# Patient Record
Sex: Male | Born: 1945 | Race: White | Hispanic: No | Smoking: Never smoker
Health system: Southern US, Community
[De-identification: ages and names within clinical notes are randomized; demographics above are authoritative.]

## PROBLEM LIST (undated history)

## (undated) DIAGNOSIS — N201 Calculus of ureter: Secondary | ICD-10-CM

## (undated) DIAGNOSIS — R3915 Urgency of urination: Secondary | ICD-10-CM

## (undated) DIAGNOSIS — M5136 Other intervertebral disc degeneration, lumbar region: Secondary | ICD-10-CM

## (undated) DIAGNOSIS — Z87442 Personal history of urinary calculi: Secondary | ICD-10-CM

## (undated) DIAGNOSIS — M549 Dorsalgia, unspecified: Secondary | ICD-10-CM

## (undated) DIAGNOSIS — M51369 Other intervertebral disc degeneration, lumbar region without mention of lumbar back pain or lower extremity pain: Secondary | ICD-10-CM

## (undated) DIAGNOSIS — F410 Panic disorder [episodic paroxysmal anxiety] without agoraphobia: Secondary | ICD-10-CM

## (undated) DIAGNOSIS — K635 Polyp of colon: Secondary | ICD-10-CM

## (undated) DIAGNOSIS — Z8719 Personal history of other diseases of the digestive system: Secondary | ICD-10-CM

## (undated) DIAGNOSIS — R112 Nausea with vomiting, unspecified: Secondary | ICD-10-CM

## (undated) DIAGNOSIS — Z9889 Other specified postprocedural states: Secondary | ICD-10-CM

## (undated) DIAGNOSIS — N200 Calculus of kidney: Secondary | ICD-10-CM

## (undated) DIAGNOSIS — M1990 Unspecified osteoarthritis, unspecified site: Secondary | ICD-10-CM

## (undated) DIAGNOSIS — F419 Anxiety disorder, unspecified: Secondary | ICD-10-CM

## (undated) DIAGNOSIS — E7800 Pure hypercholesterolemia, unspecified: Secondary | ICD-10-CM

## (undated) DIAGNOSIS — J45909 Unspecified asthma, uncomplicated: Secondary | ICD-10-CM

## (undated) DIAGNOSIS — G8929 Other chronic pain: Secondary | ICD-10-CM

## (undated) DIAGNOSIS — F1011 Alcohol abuse, in remission: Secondary | ICD-10-CM

## (undated) DIAGNOSIS — Z87440 Personal history of urinary (tract) infections: Secondary | ICD-10-CM

## (undated) HISTORY — DX: Pure hypercholesterolemia, unspecified: E78.00

## (undated) HISTORY — DX: Other intervertebral disc degeneration, lumbar region without mention of lumbar back pain or lower extremity pain: M51.369

## (undated) HISTORY — PX: ELBOW SURGERY: SHX618

## (undated) HISTORY — DX: Panic disorder (episodic paroxysmal anxiety): F41.0

## (undated) HISTORY — PX: TONSILLECTOMY AND ADENOIDECTOMY: SUR1326

## (undated) HISTORY — PX: NASAL POLYP SURGERY: SHX186

## (undated) HISTORY — PX: JOINT REPLACEMENT: SHX530

## (undated) HISTORY — PX: KNEE ARTHROSCOPY W/ MENISCECTOMY: SHX1879

## (undated) HISTORY — PX: APPENDECTOMY: SHX54

## (undated) HISTORY — DX: Other intervertebral disc degeneration, lumbar region: M51.36

---

## 1987-12-23 HISTORY — PX: OTHER SURGICAL HISTORY: SHX169

## 1991-12-23 HISTORY — PX: CERVICAL FUSION: SHX112

## 1996-12-22 HISTORY — PX: LAPAROSCOPIC CHOLECYSTECTOMY: SUR755

## 1999-10-23 DIAGNOSIS — Z86010 Personal history of colonic polyps: Secondary | ICD-10-CM | POA: Insufficient documentation

## 1999-11-13 ENCOUNTER — Other Ambulatory Visit: Admission: RE | Admit: 1999-11-13 | Discharge: 1999-11-13 | Payer: Self-pay | Admitting: *Deleted

## 1999-12-20 ENCOUNTER — Encounter: Admission: RE | Admit: 1999-12-20 | Discharge: 1999-12-20 | Payer: Self-pay | Admitting: Gastroenterology

## 1999-12-20 ENCOUNTER — Encounter (INDEPENDENT_AMBULATORY_CARE_PROVIDER_SITE_OTHER): Payer: Self-pay | Admitting: Specialist

## 1999-12-20 ENCOUNTER — Encounter: Payer: Self-pay | Admitting: Gastroenterology

## 1999-12-20 ENCOUNTER — Ambulatory Visit (HOSPITAL_COMMUNITY): Admission: RE | Admit: 1999-12-20 | Discharge: 1999-12-20 | Payer: Self-pay | Admitting: Gastroenterology

## 2000-04-02 ENCOUNTER — Encounter: Payer: Self-pay | Admitting: Specialist

## 2000-04-02 ENCOUNTER — Encounter: Admission: RE | Admit: 2000-04-02 | Discharge: 2000-04-02 | Payer: Self-pay | Admitting: Specialist

## 2000-12-22 DIAGNOSIS — F410 Panic disorder [episodic paroxysmal anxiety] without agoraphobia: Secondary | ICD-10-CM

## 2000-12-22 HISTORY — DX: Panic disorder (episodic paroxysmal anxiety): F41.0

## 2001-01-21 DIAGNOSIS — F410 Panic disorder [episodic paroxysmal anxiety] without agoraphobia: Secondary | ICD-10-CM

## 2001-03-20 ENCOUNTER — Emergency Department (HOSPITAL_COMMUNITY): Admission: EM | Admit: 2001-03-20 | Discharge: 2001-03-20 | Payer: Self-pay | Admitting: Emergency Medicine

## 2001-06-01 ENCOUNTER — Ambulatory Visit (HOSPITAL_COMMUNITY): Admission: RE | Admit: 2001-06-01 | Discharge: 2001-06-01 | Payer: Self-pay | Admitting: Gastroenterology

## 2001-06-09 ENCOUNTER — Encounter: Payer: Self-pay | Admitting: Emergency Medicine

## 2001-06-09 ENCOUNTER — Emergency Department (HOSPITAL_COMMUNITY): Admission: EM | Admit: 2001-06-09 | Discharge: 2001-06-09 | Payer: Self-pay | Admitting: Emergency Medicine

## 2005-01-09 ENCOUNTER — Ambulatory Visit (HOSPITAL_COMMUNITY): Admission: RE | Admit: 2005-01-09 | Discharge: 2005-01-09 | Payer: Self-pay | Admitting: Gastroenterology

## 2005-05-29 ENCOUNTER — Encounter: Admission: RE | Admit: 2005-05-29 | Discharge: 2005-05-29 | Payer: Self-pay | Admitting: Otolaryngology

## 2005-07-05 ENCOUNTER — Emergency Department (HOSPITAL_COMMUNITY): Admission: EM | Admit: 2005-07-05 | Discharge: 2005-07-05 | Payer: Self-pay | Admitting: Family Medicine

## 2005-08-14 IMAGING — CR DG KNEE COMPLETE 4+V*L*
4 series · 4 of 4 positions shown · non-contrast
Comparison: none

CLINICAL DATA: Injured knee 2 week ago with pain.   
 LEFT KNEE ? FOUR VIEWS:
 Four views of the left knee were obtained.   No acute bony abnormality is seen.   There is slight loss of joint space medially.  Degenerative spurring is noted from the patella superiorly.

[view not recorded (1 of 4)]
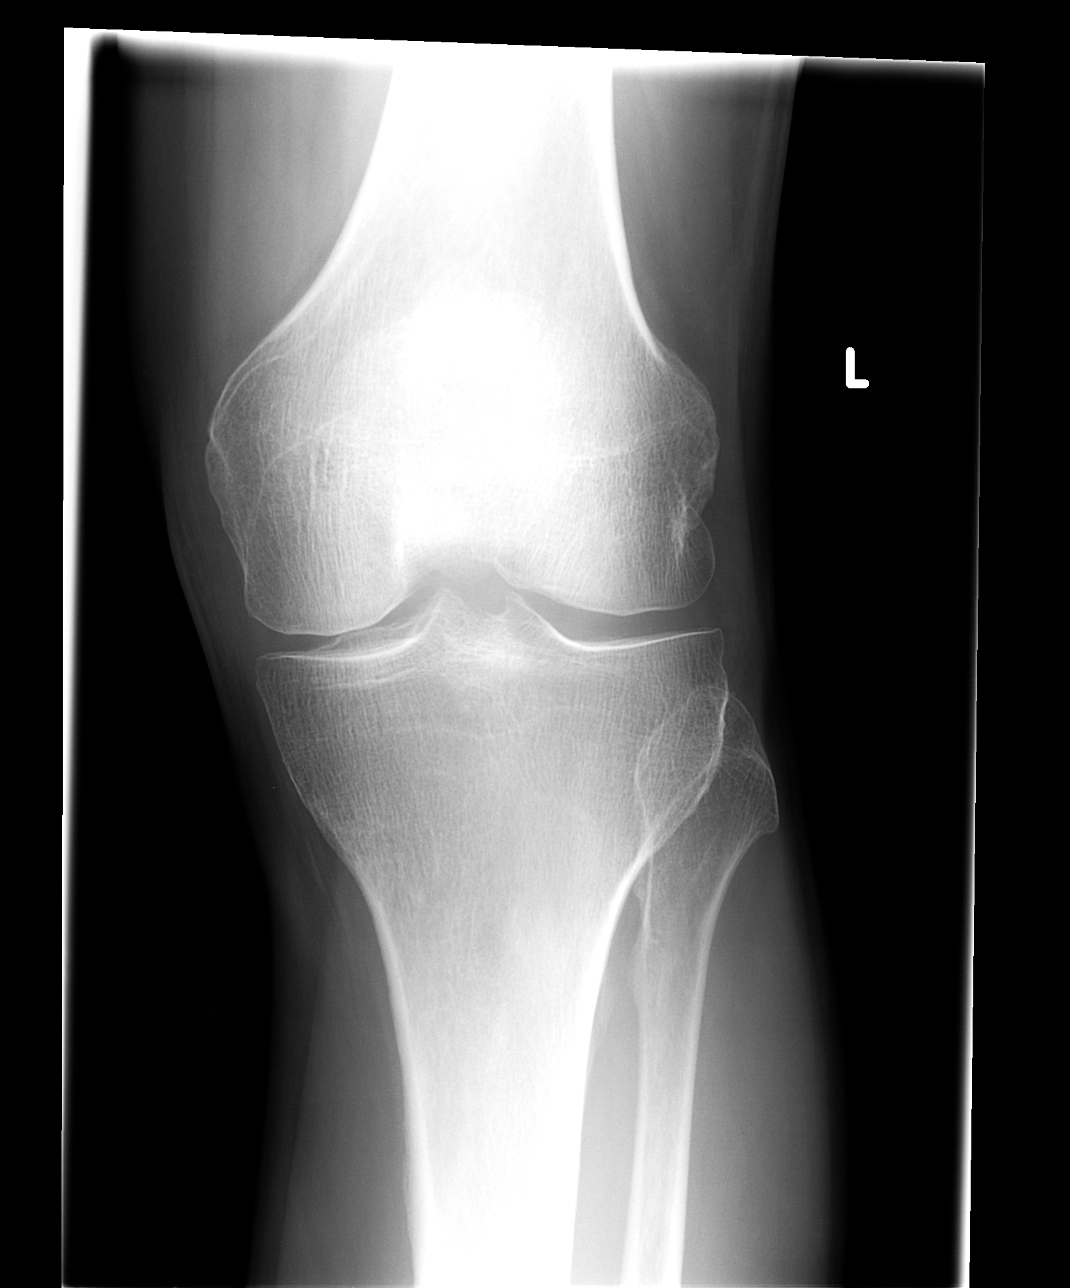

[view not recorded (2 of 4)]
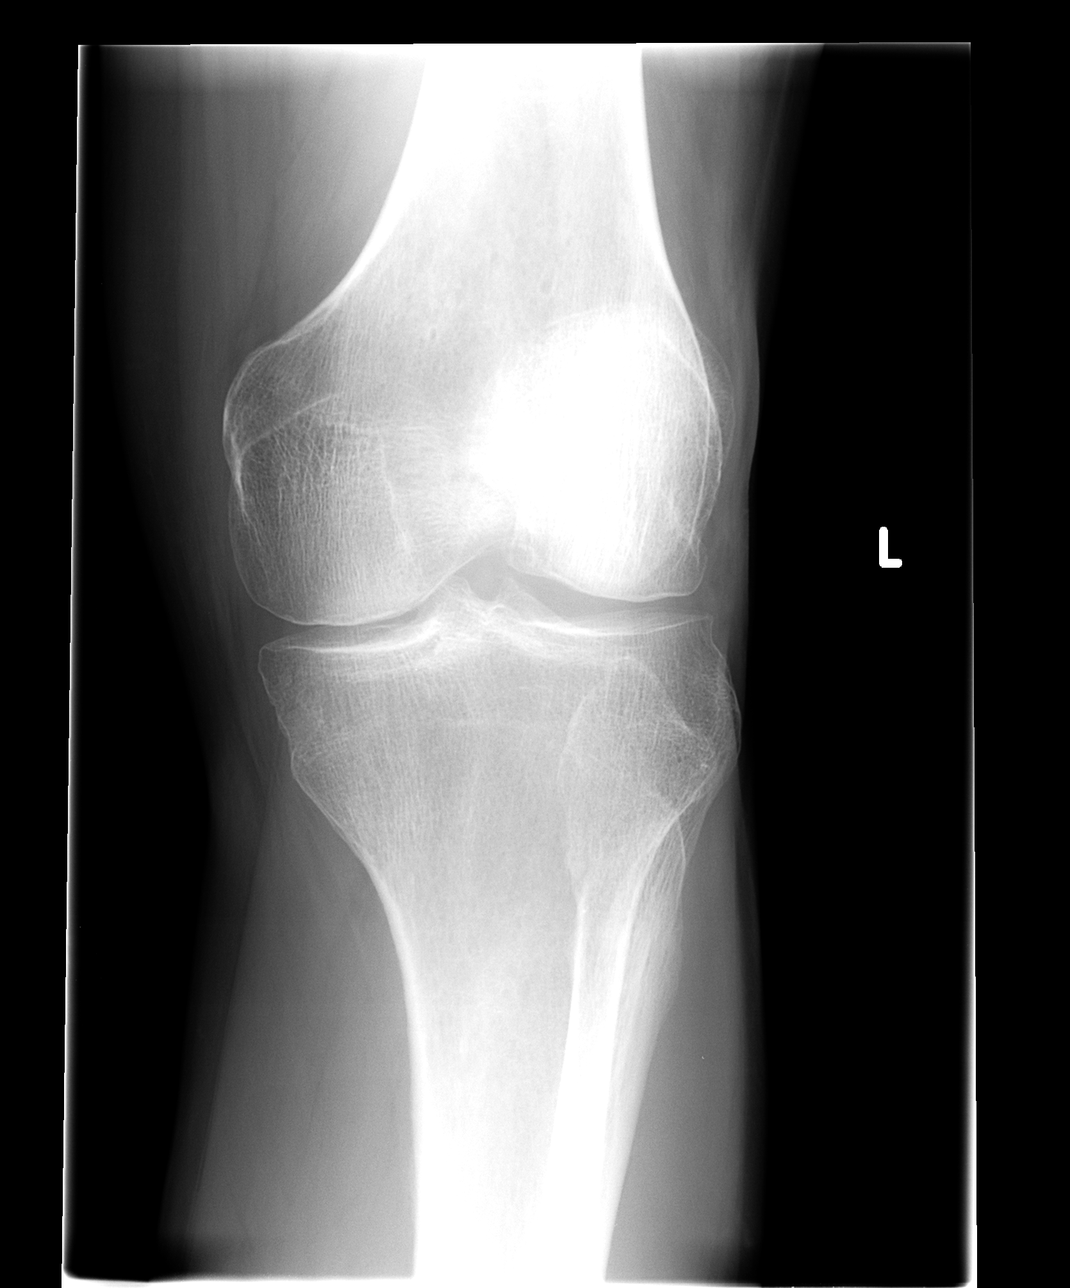

[view not recorded (3 of 4)]
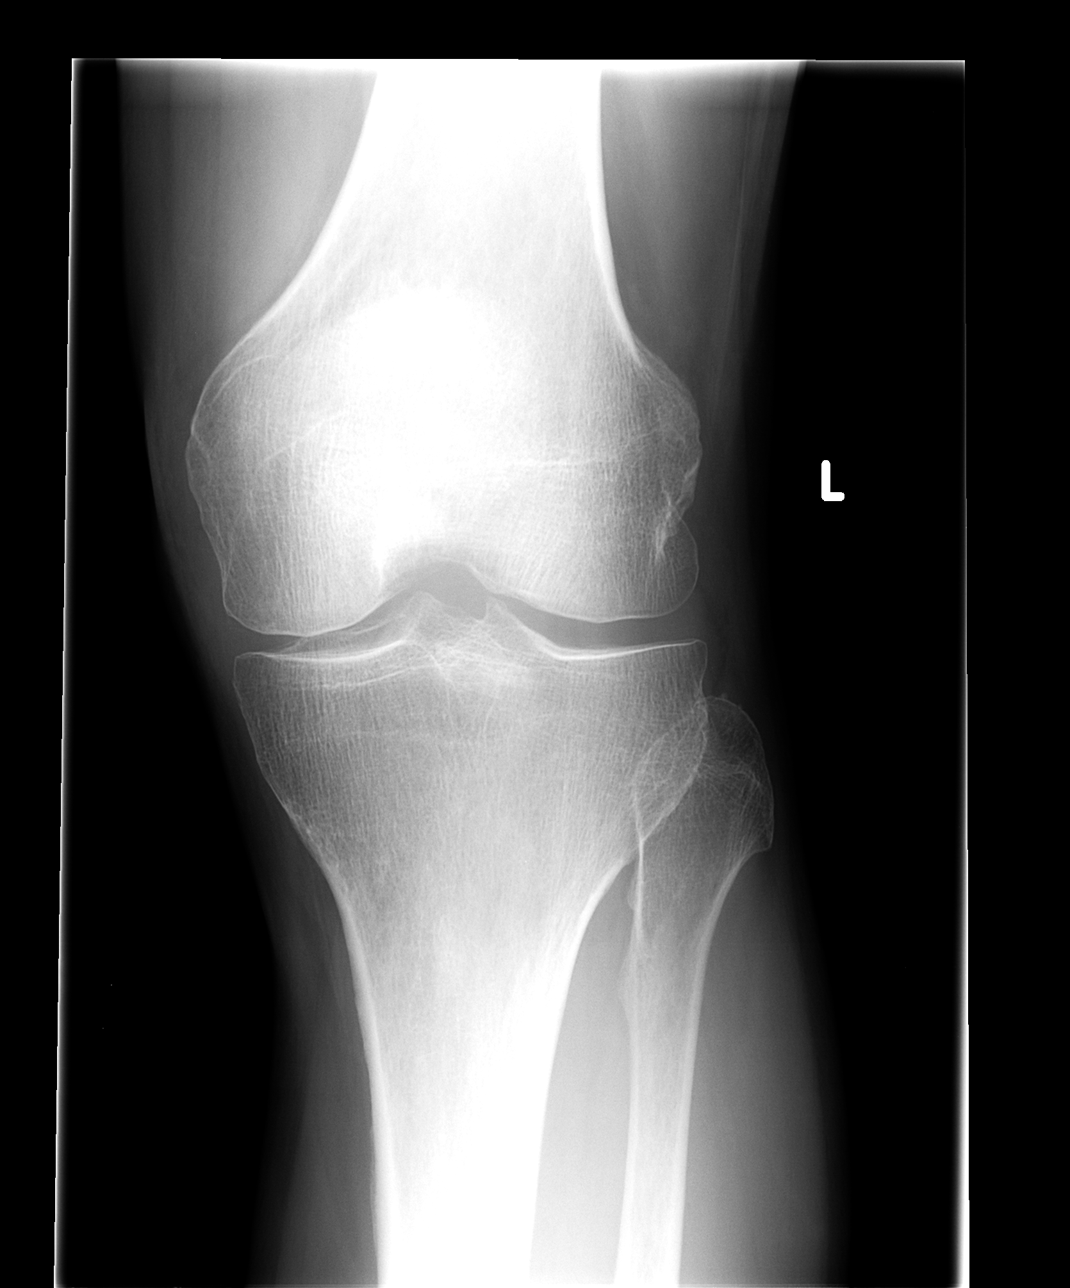

[view not recorded (4 of 4)]
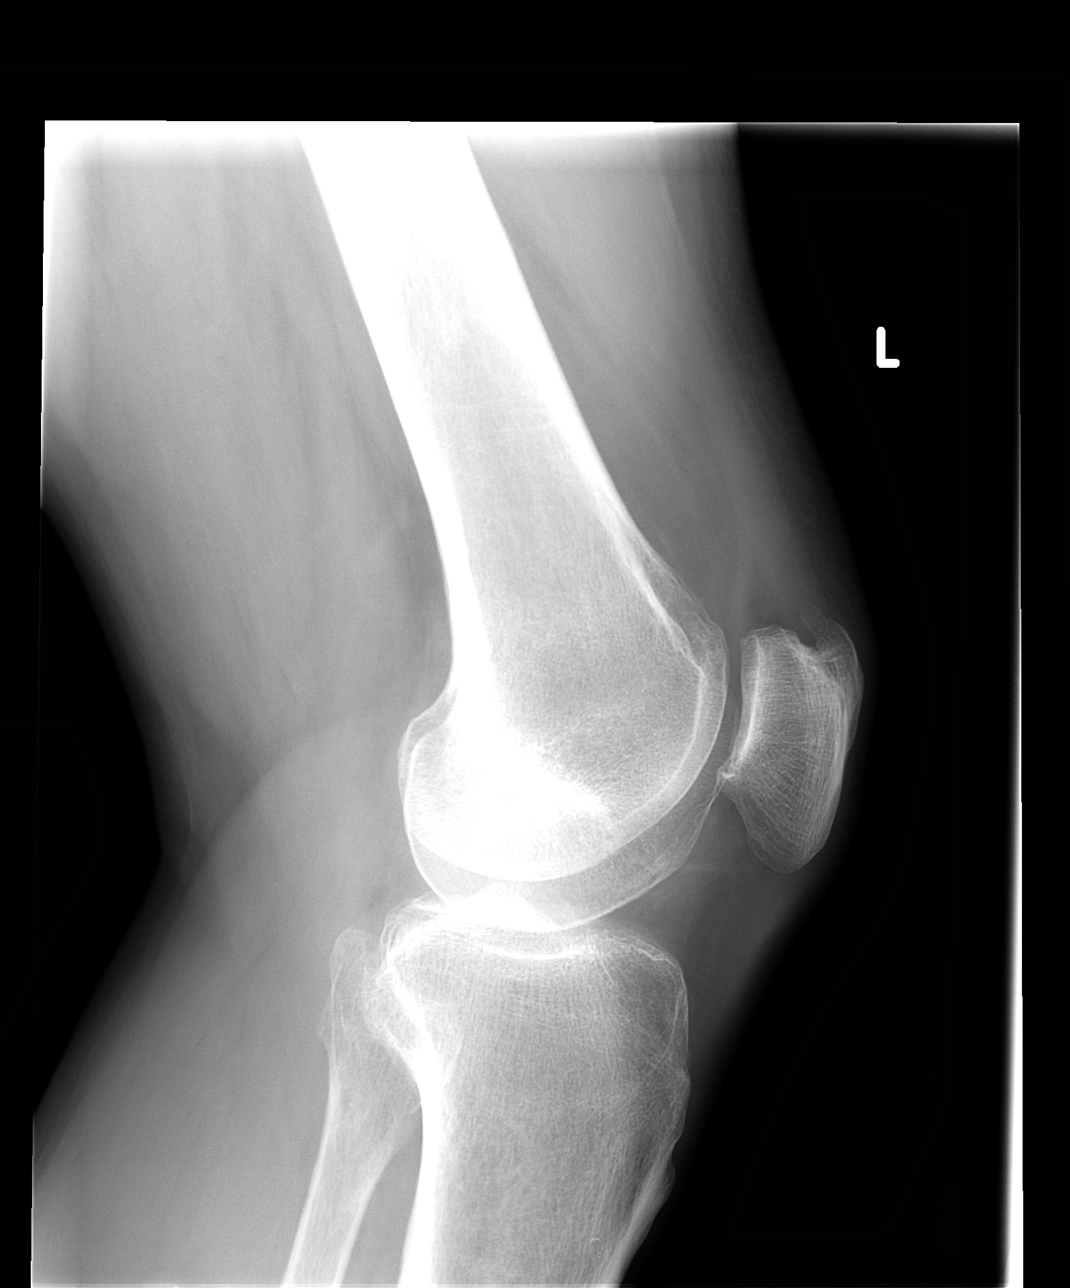

[4 of 4 positions shown; findings below may reference images not displayed]

IMPRESSION: Degenerative change.  No acute abnormality.   No effusion.

## 2006-07-30 ENCOUNTER — Encounter: Admission: RE | Admit: 2006-07-30 | Discharge: 2006-07-30 | Payer: Self-pay | Admitting: Internal Medicine

## 2008-01-27 ENCOUNTER — Ambulatory Visit (HOSPITAL_COMMUNITY): Admission: RE | Admit: 2008-01-27 | Discharge: 2008-01-27 | Payer: Self-pay | Admitting: Urology

## 2008-10-03 ENCOUNTER — Encounter: Admission: RE | Admit: 2008-10-03 | Discharge: 2008-10-03 | Payer: Self-pay | Admitting: Specialist

## 2008-10-05 ENCOUNTER — Ambulatory Visit (HOSPITAL_BASED_OUTPATIENT_CLINIC_OR_DEPARTMENT_OTHER): Admission: RE | Admit: 2008-10-05 | Discharge: 2008-10-05 | Payer: Self-pay | Admitting: Specialist

## 2008-10-05 HISTORY — PX: SHOULDER ARTHROSCOPY WITH ROTATOR CUFF REPAIR AND SUBACROMIAL DECOMPRESSION: SHX5686

## 2009-02-15 ENCOUNTER — Ambulatory Visit (HOSPITAL_BASED_OUTPATIENT_CLINIC_OR_DEPARTMENT_OTHER): Admission: RE | Admit: 2009-02-15 | Discharge: 2009-02-15 | Payer: Self-pay | Admitting: Specialist

## 2009-02-15 ENCOUNTER — Encounter (INDEPENDENT_AMBULATORY_CARE_PROVIDER_SITE_OTHER): Payer: Self-pay | Admitting: Specialist

## 2009-02-15 HISTORY — PX: OTHER SURGICAL HISTORY: SHX169

## 2009-04-17 ENCOUNTER — Ambulatory Visit (HOSPITAL_BASED_OUTPATIENT_CLINIC_OR_DEPARTMENT_OTHER): Admission: RE | Admit: 2009-04-17 | Discharge: 2009-04-17 | Payer: Self-pay | Admitting: Urology

## 2009-04-17 HISTORY — PX: OTHER SURGICAL HISTORY: SHX169

## 2010-01-31 ENCOUNTER — Ambulatory Visit (HOSPITAL_COMMUNITY): Admission: RE | Admit: 2010-01-31 | Discharge: 2010-01-31 | Payer: Self-pay | Admitting: Gastroenterology

## 2010-01-31 LAB — HM COLONOSCOPY

## 2010-04-26 ENCOUNTER — Inpatient Hospital Stay (HOSPITAL_COMMUNITY): Admission: RE | Admit: 2010-04-26 | Discharge: 2010-04-28 | Payer: Self-pay | Admitting: Orthopedic Surgery

## 2010-04-26 HISTORY — PX: TOTAL HIP ARTHROPLASTY: SHX124

## 2010-11-05 ENCOUNTER — Encounter (INDEPENDENT_AMBULATORY_CARE_PROVIDER_SITE_OTHER): Payer: Self-pay | Admitting: Internal Medicine

## 2010-11-05 LAB — CONVERTED CEMR LAB
Cholesterol: 224 mg/dL
HDL: 57 mg/dL
LDL Cholesterol: 151 mg/dL
Triglycerides: 79 mg/dL

## 2010-11-29 ENCOUNTER — Ambulatory Visit: Payer: Self-pay | Admitting: Internal Medicine

## 2010-11-29 DIAGNOSIS — K5730 Diverticulosis of large intestine without perforation or abscess without bleeding: Secondary | ICD-10-CM | POA: Insufficient documentation

## 2010-11-29 DIAGNOSIS — J45909 Unspecified asthma, uncomplicated: Secondary | ICD-10-CM

## 2010-11-29 DIAGNOSIS — M76899 Other specified enthesopathies of unspecified lower limb, excluding foot: Secondary | ICD-10-CM

## 2010-12-11 ENCOUNTER — Encounter (INDEPENDENT_AMBULATORY_CARE_PROVIDER_SITE_OTHER): Payer: Self-pay | Admitting: Internal Medicine

## 2010-12-18 ENCOUNTER — Encounter (INDEPENDENT_AMBULATORY_CARE_PROVIDER_SITE_OTHER): Payer: Self-pay | Admitting: Nurse Practitioner

## 2010-12-30 ENCOUNTER — Telehealth (INDEPENDENT_AMBULATORY_CARE_PROVIDER_SITE_OTHER): Payer: Self-pay | Admitting: Internal Medicine

## 2011-01-08 ENCOUNTER — Ambulatory Visit
Admission: RE | Admit: 2011-01-08 | Discharge: 2011-01-08 | Payer: Self-pay | Source: Home / Self Care | Attending: Internal Medicine | Admitting: Internal Medicine

## 2011-01-08 DIAGNOSIS — J309 Allergic rhinitis, unspecified: Secondary | ICD-10-CM | POA: Insufficient documentation

## 2011-01-10 ENCOUNTER — Telehealth (INDEPENDENT_AMBULATORY_CARE_PROVIDER_SITE_OTHER): Payer: Self-pay | Admitting: Internal Medicine

## 2011-01-15 ENCOUNTER — Telehealth (INDEPENDENT_AMBULATORY_CARE_PROVIDER_SITE_OTHER): Payer: Self-pay | Admitting: Nurse Practitioner

## 2011-01-23 NOTE — Progress Notes (Signed)
Summary: NASAL SPRAY CASUING HEADACHES  Phone Note Call from Patient Call back at Home Phone [phone]   Reason for Call: Talk to Nurse Summary of Call: [name] PT. [name] CALLED AND SAYS THAT AFTER USING THE NASAL SPRAY, HE STARTED EXPERIENCING HEADACHES.  HE USES [facility] [address] Initial call taken by: Leodis Rains,  January 10, 2011 12:36 PM  Follow-up for Phone Call        Has used spray daily since Wednesday, headaches started yesterday, went away last night.  Used nasal spray again today, aching, "irritating"  headache over eyes occurred again.  Denies other symptoms.  Wants to know if there is anything else for him to try. Follow-up by: Dutch Quint RN,  January 10, 2011 12:51 PM  Additional Follow-up for Phone Call Additional follow up Details #1::        Have him try cutting the dose down to 1 spray each nostril and see if gets some benefit over time without the headache.  If still with the headache to  stop and we will see how he does just on the Fexofenadine. Additional Follow-up by: Julieanne Manson MD,  January 13, 2011 11:40 AM    Additional Follow-up for Phone Call Additional follow up Details #2::    Pt. advised of provider's instructions re nasal spray and f/u - verbalized understanding and agreement.  States he had a slight fever and some chills over the weekend, aching all over, mild cold symptoms.  Thinks the headache may be related to current symptoms.  Denies nausea/vomiting, difficulty breathing.  Appetite normal, dry cough.  Has been taking tylenol PM at night, helping with discomfort and elevated temp at night.    Is feeling better today, denies being "sick", but mild malaise and aching persists. Advised as per cold protocol - increase fluids, humidify home, Robitussin for cough.  To call back if symptoms persist or worsen.   Dutch Quint RN  January 13, 2011 11:52 AM

## 2011-01-23 NOTE — Assessment & Plan Note (Signed)
Summary: SINUS PROBLEMS / NS   Vital Signs:  Patient profile:   66 year old male Weight:      214.06 pounds O2 Sat:      95 % on Room air Temp:     96.9 degrees F oral Pulse rate:   62 / minute Pulse rhythm:   regular Resp:     16 per minute BP sitting:   116 / 74  (left arm) Cuff size:   regular  Vitals Entered By: Hale Drone CMA (January 08, 2011 9:26 AM)  O2 Flow:  Room air  Serial Vital Signs/Assessments:                                PEF    PreRx  PostRx Time      O2 Sat  O2 Type     L/min  L/min  L/min   By 9:33 AM   95  %   Room air                          Hale Drone CMA  Comments: 9:33 AM Peak Flow: 480 - 440 - 460 By: Hale Drone CMA   CC: F/u from  [facility]. He went on 12/31/10. Was given Mometasone Furoate Inhalation Powder. Is feeling better. Also having some sinus problems. Also taking Ventolin.  Is Patient Diabetic? No Pain Assessment Patient in pain? no       Does patient need assistance? Functional Status Self care Ambulation Normal   CC:  F/u from  [facility]. He went on 12/31/10. Was given Mometasone Furoate Inhalation Powder. Is feeling better. Also having some sinus problems. Also taking Ventolin. Marland Kitchen  History of Present Illness: 1.  Asthma exacerbation:  was seen at [facility] after hours clinic on the 10th for wheezing.  Had had symptoms of chest congestion, cough, wheeze for about 1 week.  Was given a sample of Asmanex, Ventolin and  a Z pack.  Feels better, but still notes wheezing at night when lying down. Still coughing, but much better.  Sinus congestion all the time--especially with weather changes.  Nasal congestion plus runny nose--generally clear.  No fevers.  Cannot recall ever using a corticosteriod nasal spray regularly--used just here and there.  Currently, using saline nasal spray regularly.  Was diagnosed with "asthma" in 1998, but has very rare exacerbations--this is really only the 3rd time since  diagnosis.  States he did have PFTs that supported diagnosis.  Sounds like he has never used regular treatment.    Current Medications (verified): 1)  Paxil 40 Mg Tabs (Paroxetine Hcl) .... Once Daily  Allergies (verified): No Known Drug Allergies Nurse Visit   Vital Signs:  Patient profile:   65 year old male Weight:      214.06 pounds O2 Sat:      95 % on Room air Temp:     96.9 degrees F oral Pulse rate:   62 / minute Pulse rhythm:   regular Resp:     16 per minute BP sitting:   116 / 74  (left arm) Cuff size:   regular  Vitals Entered By: Hale Drone CMA (January 08, 2011 9:26 AM)  O2 Flow:  Room air  Current Medications (verified): 1)  Paxil 40 Mg Tabs (Paroxetine Hcl) .... Once Daily  Allergies (verified): No Known Drug Allergies  Not Administered:  Influenza Vaccine not given due to: declined  Orders Added: 1)  Peak Flow Rate [94150] 2)  Pulse Oximetry (single measurment) [94760] 3)  Est. Patient Level III [30865] Prescriptions: FEXOFENADINE HCL 60 MG TABS (FEXOFENADINE HCL) 1 tab by mouth two times a day  #60 x 11   Entered and Authorized by:   Julieanne Manson MD   Signed by:   Julieanne Manson MD on 01/08/2011   Method used:   Electronically to        [facility] [phone]* (retail)       [address]       [place], [place]  [address]       Ph: [phone] or [phone]       Fax: [phone]   RxID:   [id number] FLUTICASONE PROPIONATE 50 MCG/ACT SUSP (FLUTICASONE PROPIONATE) 2 sprays each nostril  #1 x 11   Entered and Authorized by:   Julieanne Manson MD   Signed by:   Julieanne Manson MD on 01/08/2011   Method used:   Electronically to        [facility] [phone]* (retail)       [address]       [place], [place]  [address]       Ph: [phone] or [phone]       Fax: [phone]   RxID:   [id number]    Physical Exam  General:  NAD Head:   Tender over left maxillary sinus Eyes:  No corneal or conjunctival inflammation noted. EOMI. Perrla. Funduscopic exam benign, without hemorrhages, exudates or papilledema. Vision grossly normal. Ears:  External ear exam shows no significant lesions or deformities.  Otoscopic examination reveals clear canals, tympanic membranes are intact bilaterally without bulging, retraction, inflammation or discharge. Hearing is grossly normal bilaterally. Nose:  Nasal mucosa a bit swollen with clear discharge Mouth:  pharynx pink and moist.   Neck:  No deformities, masses, or tenderness noted. Lungs:  Normal respiratory effort, chest expands symmetrically. Lungs are clear to auscultation, no crackles or wheezes. Heart:  Normal rate and regular rhythm. S1 and S2 normal without gallop, murmur, click, rub or other extra sounds.   Impression & Recommendations:  Problem # 1:  ASTHMA (ICD-493.90) Very mild--only 3 exacerbations in over 10 years To finish Asmanex inhaler Otherwise, just use rescue inhaler and try and control what appear to be allergies Orders: Peak Flow Rate (94150) Pulse Oximetry (single measurment) (62703)  Problem # 2:  ALLERGIC RHINITIS (ICD-477.9) Start Fluticasone and Fexofenadine Continue nasal saline His updated medication list for this problem includes:    Fluticasone Propionate 50 Mcg/act Susp (Fluticasone propionate) .Marland Kitchen... 2 sprays each nostril    Fexofenadine Hcl 60 Mg Tabs (Fexofenadine hcl) .Marland Kitchen... 1 tab by mouth two times a day  Complete Medication List: 1)  Paxil 40 Mg Tabs (Paroxetine hcl) .... Once daily 2)  Fluticasone Propionate 50 Mcg/act Susp (Fluticasone propionate) .... 2 sprays each nostril 3)  Fexofenadine Hcl 60 Mg Tabs (Fexofenadine hcl) .Marland Kitchen.. 1 tab by mouth two times a day  Patient Instructions: 1)  Call if symptoms do not improve 2)  Call in May or June for CPE in November Prescriptions: FEXOFENADINE HCL 60 MG TABS (FEXOFENADINE HCL) 1 tab by mouth two  times a day  #60 x 11   Entered and Authorized by:   Julieanne Manson MD  Signed by:   Julieanne Manson MD on 01/08/2011   Method used:   Electronically to        [facility] [phone]* (retail)       [address]       [place], [place]  [address]       Ph: [phone] or [phone]       Fax: [phone]   RxID:   [id number] FLUTICASONE PROPIONATE 50 MCG/ACT SUSP (FLUTICASONE PROPIONATE) 2 sprays each nostril  #1 x 11   Entered and Authorized by:   Julieanne Manson MD   Signed by:   Julieanne Manson MD on 01/08/2011   Method used:   Electronically to        [facility] [phone]* (retail)       [address]       [place], [place]  [address]       Ph: [phone] or [phone]       Fax: [phone]   RxID:   [id number]    Orders Added: 1)  Peak Flow Rate [94150] 2)  Pulse Oximetry (single measurment) [94760] 3)  Est. Patient Level III [16073]   Not Administered:    Influenza Vaccine not given due to: declined

## 2011-01-23 NOTE — Progress Notes (Signed)
Summary: CHILLS/BODYACHING  Phone Note Call from Patient Call back at Home Phone [phone]   Summary of Call: MULBERRY PT. MR Turbin CALLED AND SAYS THAT OVER THE WEEKEND HE STARTED HAVING CHILLS, HE DOESNT FEEL SICK, CHILLS, BODY ACHES FROM TOP TO BOTTOM, A LITTLE FEVER, HEADACHE. MR Dorion IS QUESTIONING IF ALL THESE SYMPTOMS ARE COMING FROM WHERE HE HAD TO SEE HIS DENTIST LAST WEEK BECAUSE HE HAD A GUM INFECTION AND THEY DID A ROOT CANAL, ON TOP OF HIM HAVING TO TAKE AMOXICILLIN(DUE TO HAVING A HIP REPLACEMENT) BEFORE HE SAW THE DENTIS, AND THE DENTIST GAVE HIM CLINDAMYCIN 150 MG 3 X'S A DAY. MR Ohair SAYS THAT A FRIEND TOLD HIM THAT HE MAYBE TAKING TOO MUCH ANTIBIOTICS. Initial call taken by: Leodis Rains,  January 15, 2011 3:12 PM  Follow-up for Phone Call        Pt. called 2 days ago re same symptoms -- was advised per cold/cough protocols.  Please advise.  Dutch Quint RN  January 15, 2011 5:08 PM   Additional Follow-up for Phone Call Additional follow up Details #1::        I would him to take the antibiotic as ordered but if he is having a problem with the meds perhaps he should call either the dentist or whomever did his hip replacement.   Additional Follow-up by: Lehman Prom FNP,  January 15, 2011 5:24 PM    Additional Follow-up for Phone Call Additional follow up Details #2::    Advised of provider's response and instructions -- states these symptoms occurred after root canal, also had some tendonitis after hip replacement.  Is continuing to exercise -- advised that he may have to ease up on his exercise routine a little until pain resolves.  Has only been taking Tylenol PM at bedtime to help him sleep -- advised  to take OTC Tylenol ES every 6 hours as needed for pain, not to exceed 3000 mg. per day (to include Tylenol PM in his 3000 mg.)  Verbalized understanding and agreement.   Follow-up by: Dutch Quint RN,  January 16, 2011 10:15 AM

## 2011-01-23 NOTE — Letter (Signed)
Summary: REGUESTING  RECORDS FROM [facility]  REGUESTING  RECORDS FROM [facility]   Imported By: Arta Bruce 12/17/2010 13:50:58  _____________________________________________________________________  External Attachment:    Type:   Image     Comment:   External Document

## 2011-01-23 NOTE — Progress Notes (Signed)
Summary: ASTHMA/ WHEEZING  Phone Note Call from Patient Call back at Home Phone [phone]   Reason for Call: Talk to Nurse Summary of Call: [name] PT. [name] SAYS THAT HIS ASTHMA IS BOTHERING HIM, HE IS VERY CONGESTED AND HAS WHEEZING AND IS TRYING TO GET AN APPT. HE THINKS HE MAY HAVE AN INGFECTION. Initial call taken by: Leodis Rains,  December 30, 2010 10:19 AM  Follow-up for Phone Call        Been going on for about a week - only using ventolin for relief, using about once daily at night.  Thinks he needs an antibiotic.  Wheezing worse when lying down, gets relief., denies difficulty with sleep.  Primarily non-productive cough, occasionaly productive of brown mucus, chest is tight.  States he is able to exercise, more difficult currently, but denies fever or SOB, just tightness.  States he has sinus problems, has nasal congestion and whitish drainage.  Drinking plenty of water.  Advised that office is closed presently, to go to ED or UC for evaluation and treatment due to wheezing-verbalized agreement.  Dutch Quint RN  December 30, 2010 10:45 AM

## 2011-01-23 NOTE — Assessment & Plan Note (Signed)
Summary: NEW ESTAB///KT   Vital Signs:  Patient profile:   65 year old male Height:      75.5 inches Weight:      212 pounds BMI:     26.24 Temp:     97.0 degrees F oral Pulse rate:   72 / minute Pulse rhythm:   regular Resp:     18 per minute BP sitting:   110 / 80  (left arm) Cuff size:   large  Vitals Entered By: Arm[place] Shannon (November 29, 2010 2:11 PM) CC: NP Is Patient Diabetic? No Pain Assessment Patient in pain? yes     Location: hip Intensity: 6 Type: aching Onset of pain  Constant  Does patient need assistance? Functional Status Self care Ambulation Normal   CC:  NP.  History of Present Illness: 65 yo male here to establish.  No particular concerns.  Had a complete physical with [facility] last month  1.  Panic Disorder:  apparenty uses Xanax as needed at night.  Generally well controlled  2. Asthma:  only rarely uses rescue inhaler --every 2-3 months    Current Medications (verified): 1)  Paxil 40 Mg Tabs (Paroxetine Hcl) .... Once Daily  Allergies (verified): No Known Drug Allergies  Past History:  Past Surgical History: 1.  Appendectomy age 35 2.  Tonsillectomy and adenoidectomy age 80 3.  61:  Left heel spur surgery 4.  1990:  Arthroscopic surgery of left knee--cartilage injury 5.  1993:  Cspine surgery 6.  1993:  Sinus surgery--polyps 7.  1995:  Right elbow surgery 8.  1996   Repeat Right  elbow surgery 9.  1996:  Repeat arthroscopic surgery of left knee 10 .  1998:  Laparoscopic cholecystectomy 11.  2006:  Repeat sinus surgery 12.  2009:  Arthroscopic surgery of left shoulder 13.  2010:  Repeat Left arthroscopic shoulder surgery 14.  2011:  Total hip replacement  Family History: Mother, died 42:  Stomach or gynecological cancer--not sure.  Hypertension.  Anxiety Father, died 1:  colon cancer.   Two 1/2 brothers: not sure if any particular health concerns.  Both in 40s.  One maternal and one paternal. Daughter, 35:   Panic disorder.  Leaky heart valve, possibly congenital.  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  None since 1993 Drug:  None  Physical Exam  General:  NAD Lungs:  Normal respiratory effort, chest expands symmetrically. Lungs are clear to auscultation, no crackles or wheezes. Heart:  Normal rate and regular rhythm. S1 and S2 normal without gallop, murmur, click, rub or other extra sounds.  Radial pulse normal and equal   Impression & Recommendations:  Problem # 1:  PANIC DISORDER (ICD-300.01)  His updated medication list for this problem includes:    Paxil 40 Mg Tabs (Paroxetine hcl) ..... Once daily  Problem # 2:  ASTHMA (ICD-493.90) Stable with use of only rescue inhaler  Complete Medication List: 1)  Paxil 40 Mg Tabs (Paroxetine hcl) .... Once daily  Patient Instructions: 1)  Release of information from [place] [facility] and [employer] 2)  Call for complete physical in November (after the 10th)  Call at least 3 months ahead of time. 3)  Call for any problems   Orders Added: 1)  New Patient Level II [99202]  Appended Document: NEW ESTAB///KT PT DID NOT SIGN THE CORRECT RELEASE FORM. HE SIGNED FOR [place]  TO GIVE INFORMATION /NOT RECEIVED IT.NEEDS TO SIGN FORM FOR [place] TO RECEIVE INFORMATION @ HIS NEXT VISIT

## 2011-01-23 NOTE — Letter (Signed)
Summary: REQUESTING RECORDS FROM [facility]  REQUESTING RECORDS FROM [facility]   Imported By: Arta Bruce 12/17/2010 13:49:20  _____________________________________________________________________  External Attachment:    Type:   Image     Comment:   External Document

## 2011-02-16 ENCOUNTER — Encounter (INDEPENDENT_AMBULATORY_CARE_PROVIDER_SITE_OTHER): Payer: Self-pay | Admitting: Internal Medicine

## 2011-02-16 DIAGNOSIS — Z87448 Personal history of other diseases of urinary system: Secondary | ICD-10-CM | POA: Insufficient documentation

## 2011-02-16 DIAGNOSIS — N401 Enlarged prostate with lower urinary tract symptoms: Secondary | ICD-10-CM | POA: Insufficient documentation

## 2011-02-16 DIAGNOSIS — E785 Hyperlipidemia, unspecified: Secondary | ICD-10-CM | POA: Insufficient documentation

## 2011-02-16 DIAGNOSIS — M1990 Unspecified osteoarthritis, unspecified site: Secondary | ICD-10-CM | POA: Insufficient documentation

## 2011-02-27 NOTE — Miscellaneous (Signed)
Summary: [name] old record review  Clinical Lists Changes  Problems: Added new problem of HYPERLIPIDEMIA (ICD-272.4) Added new problem of OSTEOARTHRITIS (ICD-715.90) Added new problem of BENIGN PROSTATIC HYPERTROPHY, WITH OBSTRUCTION (ICD-600.01) Added new problem of PROSTATITIS, HX OF (ICD-V13.09) Added new problem of History of  CALCULUS OF DISTAL LEFT  URETER (ICD-592.1) Observations: Added new observation of PAST MED HX: OSTEOARTHRITIS (ICD-715.90) HYPERLIPIDEMIA (ICD-272.4) ALLERGIC RHINITIS (ICD-477.9) ASTHMA (ICD-493.90) COLONIC POLYPS, ADENOMATOUS, HX OF (ICD-V12.72) DIVERTICULOSIS OF COLON (ICD-562.10) PANIC DISORDER (ICD-300.01) TROCHANTERIC BURSITIS, 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  None since 1993 Drug:  None Last PE:  11/05/10 at [facility] (02/16/2011 14:21) Added new observation of PAST SURG HX: 1.  Appendectomy age 63 2.  Tonsillectomy and adenoidectomy age 75 3.  58:  Left heel spur surgery 4.  1990:  Arthroscopic surgery of left knee--cartilage injury 5.  1993:  Cspine--cervical discectomy C3-4, C4-5, C5-6 with fusion, Dr. Otelia Sergeant 6.  1993:  Sinus surgery--polyps 7.  1995:  Right elbow surgery--reattach tendon 8.  1996   Repeat Right  elbow surgery 9.  1996:  Repeat arthroscopic surgery of left knee 10 .  1998:  Laparoscopic cholecystectomy 11.  2006:  Repeat sinus surgery  Dr. Ezzard Standing 12.  2009:  Arthroscopic surgery of left shoulder 13.  2010:  Repeat Left arthroscopic shoulder surgery 14.  2011:  Total hip replacement--Dr. Despina Hick (02/16/2011 14:21) Added new observation of TRIGLYC TOT: 79 mg/dL (16/09/9603 54:09) Added new observation of LDL: 151 mg/dL (81/19/1478 29:56) Added new observation of HDL: 57 mg/dL (21/30/8657 84:69) Added new observation of CHOLESTEROL: 224 mg/dL (62/95/2841  32:44) Added new observation of COLONOSCOPY: Dr. Danise Edge, [name] GI:  pandiverticulosis, otherwise normal--repeat in 2016  (12/31/2009 14:21)        Past History:  Past Medical History: OSTEOARTHRITIS (ICD-715.90) HYPERLIPIDEMIA (ICD-272.4) ALLERGIC RHINITIS (ICD-477.9) ASTHMA (ICD-493.90) COLONIC POLYPS, ADENOMATOUS, HX OF (ICD-V12.72) DIVERTICULOSIS OF COLON (ICD-562.10) PANIC DISORDER (ICD-300.01) TROCHANTERIC BURSITIS, LEFT (ICD-726.5)  Past Surgical History: 1.  Appendectomy age 54 2.  Tonsillectomy and adenoidectomy age 22 3.  79:  Left heel spur surgery 4.  1990:  Arthroscopic surgery of left knee--cartilage injury 5.  1993:  Cspine--cervical discectomy C3-4, C4-5, C5-6 with fusion, Dr. Otelia Sergeant 6.  1993:  Sinus surgery--polyps 7.  1995:  Right elbow surgery--reattach tendon 8.  1996   Repeat Right  elbow surgery 9.  1996:  Repeat arthroscopic surgery of left knee 10 .  1998:  Laparoscopic cholecystectomy 11.  2006:  Repeat sinus surgery  Dr. Ezzard Standing 12.  2009:  Arthroscopic surgery of left shoulder 13.  2010:  Repeat Left arthroscopic shoulder surgery 14.  2011:  Total hip replacement--Dr. 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  None since 1993 Drug:  None Last PE:  11/05/10 at [facility]

## 2011-02-27 NOTE — Letter (Signed)
Summary: RECEIVED RECORDS FROM [facility]  RECEIVED RECORDS FROM [facility]   Imported By: Arta Bruce 02/18/2011 16:06:29  _____________________________________________________________________  External Attachment:    Type:   Image     Comment:   External Document

## 2011-03-11 LAB — BASIC METABOLIC PANEL
BUN: 12 mg/dL (ref 6–23)
CO2: 29 mEq/L (ref 19–32)
Calcium: 9 mg/dL (ref 8.4–10.5)
GFR calc Af Amer: >60 mL/min (ref >60)
GFR calc non Af Amer: >60 mL/min (ref >60)
GFR calc non Af Amer: >60 mL/min (ref >60)
Glucose, Bld: 126 mg/dL — ABNORMAL HIGH (ref 70–99)
Glucose, Bld: 130 mg/dL — ABNORMAL HIGH (ref 70–99)
Potassium: 5 mEq/L (ref 3.5–5.1)
Sodium: 142 mEq/L (ref 135–145)

## 2011-03-11 LAB — URINALYSIS, ROUTINE W REFLEX MICROSCOPIC
Glucose, UA: NEGATIVE mg/dL
Hgb urine dipstick: NEGATIVE
Nitrite: NEGATIVE
Protein, ur: NEGATIVE mg/dL
Specific Gravity, Urine: 1.028 (ref 1.005–1.030)
Urobilinogen, UA: 0.2 mg/dL (ref 0.0–1.0)
pH: 5.5 (ref 5.0–8.0)

## 2011-03-11 LAB — CBC
HCT: 34.5 % — ABNORMAL LOW (ref 39.0–52.0)
HCT: 36.8 % — ABNORMAL LOW (ref 39.0–52.0)
MCHC: 33.4 g/dL (ref 30.0–36.0)
MCHC: 33.5 g/dL (ref 30.0–36.0)
MCV: 100.8 fL — ABNORMAL HIGH (ref 78.0–100.0)
MCV: 102.4 fL — ABNORMAL HIGH (ref 78.0–100.0)
Platelets: 197 10*3/uL (ref 150–400)
RBC: 3.62 MIL/uL — ABNORMAL LOW (ref 4.22–5.81)
RDW: 12.8 % (ref 11.5–15.5)
WBC: 5.1 10*3/uL (ref 4.0–10.5)
WBC: 6.8 10*3/uL (ref 4.0–10.5)
WBC: 8.1 10*3/uL (ref 4.0–10.5)

## 2011-03-11 LAB — COMPREHENSIVE METABOLIC PANEL
ALT: 20 U/L (ref 0–53)
Albumin: 4 g/dL (ref 3.5–5.2)
Alkaline Phosphatase: 52 U/L (ref 39–117)
CO2: 29 mEq/L (ref 19–32)
Chloride: 110 mEq/L (ref 96–112)
Glucose, Bld: 124 mg/dL — ABNORMAL HIGH (ref 70–99)
Potassium: 3.8 mEq/L (ref 3.5–5.1)

## 2011-03-11 LAB — APTT: aPTT: 28 seconds (ref 24–37)

## 2011-03-11 LAB — TYPE AND SCREEN

## 2011-03-11 LAB — PROTIME-INR: Prothrombin Time: 16.5 seconds — ABNORMAL HIGH (ref 11.6–15.2)

## 2011-03-21 ENCOUNTER — Encounter (INDEPENDENT_AMBULATORY_CARE_PROVIDER_SITE_OTHER): Payer: Self-pay | Admitting: Internal Medicine

## 2011-03-21 ENCOUNTER — Encounter: Payer: Self-pay | Admitting: Internal Medicine

## 2011-03-25 NOTE — Assessment & Plan Note (Signed)
Summary: f/u on med change   Vital Signs:  Patient profile:   65 year old male Weight:      218.38 pounds Temp:     98.6 degrees F oral Pulse rate:   62 / minute Pulse rhythm:   regular Resp:     22 per minute BP sitting:   123 / 68  (left arm) Cuff size:   regular  Vitals Entered By: Hale Drone CMA (March 21, 2011 4:03 PM) CC: Needing to change his Asthma and Allergy meds... Wants to start taking Proventil and not use Fluticasone... Also needing a Rx for his allergies.  Is Patient Diabetic? No Pain Assessment Patient in pain? no       Does patient need assistance? Functional Status Self care Ambulation Normal   CC:  Needing to change his Asthma and Allergy meds... Wants to start taking Proventil and not use Fluticasone... Also needing a Rx for his allergies. .  History of Present Illness: 1.  Allergic Rhinitis:  Would like to switch to Xyzal, which is covered.  Used Fluticasone nasal spray, but did not seem to help, even after stopped.    2.  Allergic Bronchospasm:  Using Ventolin more since pollen higher.    3.  Anxiety:  Not sure if some of dyspnea related to anxiety.  Current Medications (verified): 1)  Paxil 40 Mg Tabs (Paroxetine Hcl) .... Once Daily 2)  Fluticasone Propionate 50 Mcg/act Susp (Fluticasone Propionate) .... 2 Sprays Each Nostril 3)  Fexofenadine Hcl 60 Mg Tabs (Fexofenadine Hcl) .Marland Kitchen.. 1 Tab By Mouth Two Times A Day  Allergies (verified): No Known Drug Allergies  Physical Exam  General:  NAD Eyes:  No conjunctival injection Ears:  TMs clear Nose:  clear discharge Mouth:  pharynx pink and moist.   Neck:  No deformities, masses, or tenderness noted. Lungs:  Scattered wheeze with mildly decreased BS Heart:  Normal rate and regular rhythm. S1 and S2 normal without gallop, murmur, click, rub or other extra sounds.   Impression & Recommendations:  Problem # 1:  ALLERGIC RHINITIS (ICD-477.9) Switch to Xyzal Stop Fluticasone The following  medications were removed from the medication list:    Fluticasone Propionate 50 Mcg/act Susp (Fluticasone propionate) .Marland Kitchen... 2 sprays each nostril His updated medication list for this problem includes:    Xyzal 5 Mg Tabs (Levocetirizine dihydrochloride) .Marland Kitchen... 1 tab by mouth daily for allergies.  Problem # 2:  ASTHMA (ICD-493.90) Exacerbated with pollen allergies Prednisone burst and taper and start inhaled steroids His updated medication list for this problem includes:    Flovent Hfa 110 Mcg/act Aero (Fluticasone propionate  hfa) .Marland Kitchen... 1 inhalation two times a day  through pollen season    Ventolin Hfa 108 (90 Base) Mcg/act Aers (Albuterol sulfate) .Marland Kitchen... 2 puffs every 4 hours as needed for wheezing    Prednisone 10 Mg Tabs (Prednisone) .Marland KitchenMarland KitchenMarland KitchenMarland Kitchen 3 tabs by mouth daily for 3 days, then 2 tabs daily for 3 days, then 1 tab daily for 3 days, then 1/2 tab daily for 3 days  Complete Medication List: 1)  Paxil 40 Mg Tabs (Paroxetine hcl) .... Once daily 2)  Xyzal 5 Mg Tabs (Levocetirizine dihydrochloride) .Marland Kitchen.. 1 tab by mouth daily for allergies. 3)  Flovent Hfa 110 Mcg/act Aero (Fluticasone propionate  hfa) .Marland Kitchen.. 1 inhalation two times a day  through pollen season 4)  Ventolin Hfa 108 (90 Base) Mcg/act Aers (Albuterol sulfate) .... 2 puffs every 4 hours as needed for wheezing 5)  Prednisone 10  Mg Tabs (Prednisone) .... 3 tabs by mouth daily for 3 days, then 2 tabs daily for 3 days, then 1 tab daily for 3 days, then 1/2 tab daily for 3 days  Patient Instructions: 1)  Follow up with Dr. Delrae Alfred in 2 weeks Prescriptions: PREDNISONE 10 MG TABS (PREDNISONE) 3 tabs by mouth daily for 3 days, then 2 tabs daily for 3 days, then 1 tab daily for 3 days, then 1/2 tab daily for 3 days  #20 x 0   Entered and Authorized by:   Julieanne Manson MD   Signed by:   Julieanne Manson MD on 03/21/2011   Method used:   Electronically to        [facility] [phone]* (retail)       [address]        [place], [place]  [address]       Ph: [phone] or [phone]       Fax: [phone]   RxID:   [id number] VENTOLIN HFA 108 (90 BASE) MCG/ACT AERS (ALBUTEROL SULFATE) 2 puffs every 4 hours as needed for wheezing  #1 x 1   Entered and Authorized by:   Julieanne Manson MD   Signed by:   Julieanne Manson MD on 03/21/2011   Method used:   Electronically to        [facility] [phone]* (retail)       [address]       [place], [place]  [address]       Ph: [phone] or [phone]       Fax: [phone]   RxID:   [id number] FLOVENT HFA 110 MCG/ACT AERO (FLUTICASONE PROPIONATE  HFA) 1 inhalation two times a day  through pollen season  #1 x 2   Entered and Authorized by:   Julieanne Manson MD   Signed by:   Julieanne Manson MD on 03/21/2011   Method used:   Electronically to        [facility] [phone]* (retail)       [address]       [place], [place]  [address]       Ph: [phone] or [phone]       Fax: [phone]   RxID:   [id number] XYZAL 5 MG TABS (LEVOCETIRIZINE DIHYDROCHLORIDE) 1 tab by mouth daily for allergies.  #30 x 11   Entered and Authorized by:   Julieanne Manson MD   Signed by:   Julieanne Manson MD on 03/21/2011   Method used:   Electronically to        [facility] [phone]* (retail)       [address]       [place], [place]  [address]       Ph: [phone] or [phone]       Fax: [phone]   RxID:   [id number]    Orders Added: 1)  Est. Patient Level III [40973]

## 2011-04-08 LAB — POCT HEMOGLOBIN-HEMACUE: Hemoglobin: 15.5 g/dL (ref 13.0–17.0)

## 2011-05-01 ENCOUNTER — Encounter: Payer: Self-pay | Admitting: Family Medicine

## 2011-05-01 ENCOUNTER — Ambulatory Visit (INDEPENDENT_AMBULATORY_CARE_PROVIDER_SITE_OTHER): Payer: Medicare Other | Admitting: Family Medicine

## 2011-05-01 ENCOUNTER — Encounter: Payer: Self-pay | Admitting: *Deleted

## 2011-05-01 DIAGNOSIS — N419 Inflammatory disease of prostate, unspecified: Secondary | ICD-10-CM

## 2011-05-01 DIAGNOSIS — R319 Hematuria, unspecified: Secondary | ICD-10-CM

## 2011-05-01 LAB — POCT UA - MICROSCOPIC ONLY: WBC, Ur, HPF, POC: 0

## 2011-05-01 LAB — POCT URINALYSIS DIPSTICK
Ketones, UA: NEGATIVE
Leukocytes, UA: NEGATIVE
Nitrite, UA: NEGATIVE
pH, UA: 5

## 2011-05-01 MED ORDER — CIPROFLOXACIN HCL 500 MG PO TABS: 500.0000 mg | ORAL_TABLET | Freq: Two times a day (BID) | ORAL | Status: AC

## 2011-05-01 NOTE — Progress Notes (Signed)
  Subjective:    Patient ID: [name], male    DOB: 10-25-1946, 65 y.o.   MRN: [id number]  HPI Patient presents with complaint of low back pain since yesterday.  Noticed blood in urine yesterday.  Urine seems better today, residual ache in low back. Urine flow is diminished, slow to start.   H/o prostate infection recently, took Cipro 4/8-4/29, as Rx'd at [facility]. Symptoms were similar then, but all responded to the Cipro, recurred yesterday. Has an ab twist-chair, using it for about a year, not sure if it could be related, as he has a h/o prostate problems in the past felt to be related to stationary bicycle, and so he stopped using the bike 6-7 years ago.He also has a h/o kidney stones. He has had lithotripsy in the past as well as stone extraction. His urologist is Dr. Annabell Howells.   Last CPE was 11/15 at [facility].  Reports PSA was normal at that time. Colonoscopy with Dr. Laural Benes q5 years (last January or Feb 2011) Review of Systems Denies fevers, nausea, vomiting.  Slightly constipated, no other bowel changes or blood in stool.  No chest pain, palpitations, headaches.  Recent URI with sinus headaches, cough, SOB--all symptoms resolved.  No skin rash    Objective:   Physical Exam BP 128/78  Pulse 64  Temp 97.6 F (36.4 C)  Ht 6\' 4"  (1.93 m)  Wt 218 lb (98.884 kg)  BMI 26.54 kg/m2 Well-developed well-nourished male in no distress. HEENT: Oropharynx is clear. Mucous membranes are moist. Neck: No lymphadenopathy or thyromegaly Heart: Regular rate and rhythm no murmurs rubs or gallops Lungs: Clear to auscultation bilaterally Back: No spinal tenderness, no CVA tenderness. Area of discomfort is across the low back however there is no tenderness. No muscle spasm. Abdomen: Soft, nontender, nondistended. No hepatosplenomegaly or masses. No suprapubic tenderness. Rectal: Prostate is mildly enlarged, somewhat soft and boggy more on the left side on the right. Nontender.  Heme-negative stool.     U/a abnormal--showing 3+ blood, TNTC RBC's on micro  Assessment & Plan:   1. Hematuria  POCT urinalysis dipstick, Ambulatory referral to Urology, POCT UA - Microscopic Only  2. Prostatitis  ciprofloxacin (CIPRO) 500 MG tablet, Ambulatory referral to Urology   differential diagnosis of hematuria reviewed with the patient's. Given that he has had recurrent symptoms over the last couple of months, I recommend that he followup with urology. Referral was written. He may have a mild prostatitis, however I am not comfortable thinking that his degree of hematuria is related to this.

## 2011-05-06 NOTE — Op Note (Signed)
[name], [name]              ACCOUNT NO.:  [id number]   MEDICAL RECORD NO.:  [id number]          PATIENT TYPE:  AMB   LOCATION:  [facility]                         FACILITY:  [facility]   PHYSICIAN:  Jene Every, M.D.    DATE OF BIRTH:  Sep 08, 1946   DATE OF PROCEDURE:  02/15/2009  DATE OF DISCHARGE:                               OPERATIVE REPORT   PREOPERATIVE DIAGNOSES:  Adhesive capsulitis and painful lipoma, left  shoulder.   POSTOPERATIVE DIAGNOSES:  Adhesive capsulitis and painful lipoma, left  shoulder.   PROCEDURE PERFORMED:  1. Exam under anesthesia.  2. Manipulation under anesthesia.  3. Excision of lipoma, left shoulder.   ANESTHESIA:  General.   ASSISTANT:  Roma Schanz, P.A.   BRIEF HISTORY:  This is a 65 year old status post rotator cuff  debridement, manipulation for impingement syndrome, partial tear of the  rotator cuff.  Postoperatively had done well until physical therapy  which he was doing on his own, developed progressive restriction of his  range of motion and pain.  He felt his symptoms were localized over a  lipoma which he has had for a number of years over the anterolateral  aspect of his deltoid.  It was painful to palpation.  His MRI indicated  subcutaneous lipoma only.  He was indicated for manipulation under  anesthesia which I felt was probably the majority of his symptoms,  though he did have localized pain and wished the lipoma excised.  The  risks and benefits were discussed with him including bleeding,  infection, fracture, suboptimal range of motion, need for repeat  debridement, anesthetic complications etc.   TECHNIQUE:  With the patient in the supine position, after induction of  adequate general anesthesia and 1 gram of Kefzol the left shoulder was  evaluated.  He had only 6 degrees of abduction, 90 degrees of forward  flexion.  With gentle manipulative pressure over time forward flexion  was relieved and improved to 160 degrees.   Then following that abduction  was gently improved with appreciation if both planes with lysis of  adhesion to 120 degrees.  Initially, he  zero degree of external  rotation.  We obtained 50 degrees of external rotation with gentle  manipulative pressure gravity towards the proximal.  We stopped at that  point because it appeared that undo force would be required to improve  it any further.  We did not want to fracture the humerus.   The internal rotation was improved as well.  The left upper extremity  was then prepped and draped in the usual sterile fashion.  The area that  was delineated over the anterolateral aspect of the omental deltoid  which was palpated preoperatively and confirmed by the patient was  centered.  We had prepped and draped the left upper extremity.  We used  Marcaine with epinephrine in the subcutaneous tissue and we made a small  2-3 cm incision linearly over this middle aspect of the distal lateral  deltoid through the skin only.  The subcutaneous tissue was dissected.  Electrocautery to achieve hemostasis.  A encapsulated lipoma was  identified  using an Allis to provide traction to that and each end for  counter traction and we gently with tenotomy scissors removed the lipoma  with its fibrous attachments from the fascia of the deltoid.  We did not  broach the fascia.  It appeared to be a simple lipoma and it was not  hard.  There was no abnormal malignant tissue.  This was excised.  It  was 2 x 1.5 cm.  I then examined beneath that.  There was no broaching  of the fascia.  It was otherwise unremarkable.  We copiously irrigated  this.  Cautery was utilized to achieve hemostasis.  Closed the  subcutaneous tissue with 2-0 Vicryl simple sutures.  The skin was  reapproximated with 3-0 subcuticular Prolene.  Wound reinforced with  Steri-Strips.  Sterile dressing applied.  I placed a sling.  Marcaine  0.25% with epinephrine was injected in the subacromial space.  He  was  extubated without difficulty and transported to the recovery room in  satisfactory condition.   The patient tolerated the procedure well and there were no  complications.      Jene Every, M.D.  Electronically Signed     JB/MEDQ  D:  02/15/2009  T:  02/16/2009  Job:  [id number]

## 2011-05-06 NOTE — Op Note (Signed)
NAMEBRAYDIN, [name]              ACCOUNT NO.:  [id number]   MEDICAL RECORD NO.:  [id number]          PATIENT TYPE:  AMB   LOCATION:  [facility]                         FACILITY:  [facility]   PHYSICIAN:  Jene Every, M.D.    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   POSTOPERATIVE DIAGNOSIS:  1. Glenoid labral tear.  2. 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  3. Impingement syndrome of the shoulder.  4. Partial tear of the rotator cuff.   PROCEDURE PERFORMED:  1. Left shoulder arthroscopy.  2. Debridement of anterior labral tear.  3. Left shoulder subacromial decompression with acromioplasty.  4. Debridement of partial tear of the rotator cuff.   BRIEF HISTORY/INDICATIONS:  A 65 year old with refractory shoulder pain,  MRI indicating partial-tear labral tear, degenerative fraying,  refractory to conservative treatment.  Indicated for subacromial  decompression, possible from rotator cuff repair.  Risks and benefits  were discussed, including bleeding, infection, damage to vascular  structures, suboptimal range of motion, recurrent tear, need for repair,  etc.   TECHNIQUE:  With the patient placed supine in the beach-chair position  after the induction of adequate general anesthesia and 1 gm of Kefzol,  the left shoulder and upper extremities were prepped and draped in the  usual sterile fashion.  A posterolateral portal was fashioned through  the skin only and anterolateral portal through the skin only as well as  an anterior portal through the skin only.  With the arm in the 7:30  position, we directed the cannula into the glenohumeral space,  penetrating the capsule atraumatically towards the coracoid.  Examination revealed degenerative changes of the glenohumeral joint, no  tear of the rotator cuff underneath  that.  Some fraying of the subscap  and the biceps tendon.  This is an anterolateral tear.  This is debrided  through a shaver and introduced through the anterior portal.  Following  this and lavage of the shoulder, we redirected the camera into the  subacromial space.  We directed the camera into the lateral portal of  the subacromial space.  Exuberant bursitis and synovitis was noted in  the subacromial space.  A shaver was introduced and utilized to perform  a bursectomy.  We released the morsellized CA ligament with an  ArthroWand.  That improved the subacromial space.  We  then introduced  the bur and performed an acromioplasty of the anterior edge to the [facility]  joint, significantly improving the space as well.  There was  degenerative fraying of the tear posteriorly in the mid portion of the  tendon.  This was debrided with a shaver.  There was no full-thickness  tear or tear from the insertion.  This is probed fully and inspected  anteriorly and posteriorly.  Following a full bursectomy and evacuation  of the subacromial space following the acromioplasty, we again  reinspected the tendon.  No evidence of a full-thickness tear.  We  therefore lavaged the  subacromial joint, removed all instrumentation.  The portals were closed with 4-0 nylon simple suture, and 0.25% Marcaine  with epinephrine was infiltrated in the joint.  The wound was dressed  sterilely.  Placed in a sling and extubated without difficulty and  transported to the recovery room in satisfactory condition.   Patient tolerated the procedure well with no complications.   ASSISTANT:  Roma Schanz, P.A.   Patient had some axillary rash preoperatively which was draped out  during the case.  Afterwards, we will place an antifungal cream within  the axilla.      Jene Every, M.D.  Electronically Signed     JB/MEDQ  D:  10/05/2008  T:  10/05/2008  Job:  [id number]

## 2011-05-06 NOTE — Op Note (Signed)
NAME:  [name]              ACCOUNT NO.:  [id number]   MEDICAL RECORD NO.:  [id number]          PATIENT TYPE:  AMB   LOCATION:  [facility]                         FACILITY:  [facility]   PHYSICIAN:  Excell Seltzer. Annabell Howells, M.D.    DATE OF BIRTH:  January 10, 1946   DATE OF PROCEDURE:  04/17/2009  DATE OF DISCHARGE:                               OPERATIVE REPORT   PREOPERATIVE DIAGNOSIS:  Left distal ureteral stone.   POSTOPERATIVE DIAGNOSIS:  Left distal ureteral stone.   PROCEDURE PERFORMED:  1. Cystourethroscopy.  2. Left ureteroscopy, laser lithotripsy, basket stone extraction.   SURGEON:  Excell Seltzer. Annabell Howells, M.D.   RESIDENT:  Levert Feinstein, M.D.   ANESTHESIA:  General.   DRAINS:  None.   COMPLICATIONS:  None.   INDICATIONS FOR PROCEDURE:  The patient, a 65 year old male who was  diagnosed with left distal ureteral stone, complained of hematuria and  low back pain and underwent a CT scan.  The patient had a 4-mm left  ureterovesical junction stone.  The patient was tried on medical  expulsive therapy.  The patient, however, continued to have discomfort  and reported not passing the stone.  He was scheduled today for a left  ureteroscopy, laser lithotripsy, and stone removal.  Risks and benefits  of the procedure were explained, and informed consent obtained.   DESCRIPTION OF PROCEDURE IN DETAIL:  The patient was brought to the  operating room.  Placed in supine position.  Administered general  anesthesia by the anesthesia team.  Appropriate time-out performed to  identify correct patient and procedure and the site.  The patient was  given appropriate preoperative antibiotic.  The patient was subsequently  placed in the dorsal lithotomy position and pressure points were well  padded.  The patient was prepped and draped in the usual sterile manner.  Using a 22-French sheath and a 12-degree lens, we then performed  cystourethroscopy.  The patient's anterior urethra was normal.  Posterior  urethra revealed mild bilobar prostate enlargement.  Upon  entering the bladder, the right ureteric orifice was identified in the  normal anatomic position.  The left ureteral orifice had some  erythema/edema at the ureteric orifice.  The rest of the cystoscopy did  not reveal any mass, lesion or stone.  The cystoscopy was performed with  the 70-degree lens also.  Again, no mass, lesion or stone was seen.  We  then attempted to pass a Glidewire through the left ureteric orifice up  into the left renal pelvis.  However, we at that point placed a 5-French  end-hole catheter into the left ureteric orifice and performed left  retrograde pyelogram which revealed a filling defect in the most distal  part of the ureter.  We then attempted to pass a Glidewire up into the  left renal pelvis.  However, we were meeting resistance right at the  filling defect area.  We then removed the cystoscope, advanced the semi-  rigid ureteroscope, cannulated the left ureteric orifice where we could  identify the stone filling almost the complete lumen.  At that point we  used 370-micron laser fiber  and performed laser lithotripsy of the  stone.  Once the stone was broken down into tiny particles, we used  nitinol basket and removed the stone particles.  We had dropped them  into the bladder.  We then performed ureteroscopy with the semi-rigid  ureteroscope.  No more stones were seen.  We then withdrew the  ureteroscope, advanced the cystoscope into the bladder and removed the  stone particles.  This marked the end of the procedure.  The patient was  subsequently extubated and transferred in a stable condition to the  recovery room.  Of note, Dr. Bjorn Pippin was present and available for  all aspects of the case.     ______________________________  Fransico Him M.D. Cecil Cobbs. Annabell Howells, M.D.  Electronically Signed    JMJ/MEDQ  D:  04/17/2009  T:  04/17/2009  Job:  [id number]

## 2011-05-09 NOTE — Op Note (Signed)
NAMECANON, GOLA              ACCOUNT NO.:  [id number]   MEDICAL RECORD NO.:  [id number]          PATIENT TYPE:  AMB   LOCATION:  [facility]                         FACILITY:  [facility]   PHYSICIAN:  Danise Edge, M.D.   DATE OF BIRTH:  01/17/1946   DATE OF PROCEDURE:  01/09/2005  DATE OF DISCHARGE:                                 OPERATIVE REPORT   PROCEDURE:  Colonoscopy.   PROCEDURE INDICATIONS:  Ms. [name] is a 65 year old male born  08-04-1946.  On December 19, 1998, [name] underwent a screening  colonoscopy with removal of small neoplastic polyps.  [name] father  died of colon cancer.   ENDOSCOPIST:  Danise Edge, M.D.   PREMEDICATION:  1.  Versed 7.5 mg.  2.  Demerol 100 mg.   PROCEDURE:  After obtaining informed consent, [name] was placed in the  left lateral decubitus position.  I administered intravenous Demerol and  intravenous Versed to achieve conscious sedation for the procedure.  The  patient's blood pressure, oxygen saturation, and cardiac rhythm were  monitored throughout the procedure and documented in the medical record.   Anal inspection and digital rectal exam were normal.  The prostate was  nonnodular. The Olympus adjustable pediatric colonoscope was introduced into  the rectum and advanced to the cecum.  Colonic preparation for the exam  today was satisfactory.   Rectum normal.   Sigmoid colon and descending colon:  Colonic diverticulosis.   Splenic flexure normal.   Transverse colon normal.   Hepatic flexure normal.   Ascending colon normal.   Cecum and ileocecal valve normal.   ASSESSMENT:  Normal proctocolonoscopy to the cecum.  No endoscopic evidence  for the presence of recurrent colorectal neoplasia.  Left colonic  diverticulosis noted.   RECOMMENDATIONS:  Repeat colonoscopy in five years.      MJ/MEDQ  D:  01/09/2005  T:  01/09/2005  Job:  [id number]   cc:   Marcene Duos, M.D.  [place]  [place]  [place] [address]  Fax: [phone]

## 2011-05-09 NOTE — Procedures (Signed)
[place]  Patient:    YOUCEF, KLAS                       MRN: [id number] Proc. Date: 06/01/01 Attending:  Verlin Grills, M.D. CC:         Yvette Rack. Dorna Bloom, M.D.   Procedure Report  DATE OF BIRTH:  01-09-1946  PROCEDURE PERFORMED:  Esophagogastroduodenoscopy.  ENDOSCOPIST:   Verlin Grills, M.D.  INDICATIONS FOR PROCEDURE:  The patient is a 66 year old male who has a sour taste in his mouth and borborygmi.  He is getting some relief on Prevacid.  I discussed with Mr. Fallen the complications associated with esophagogastroduodenoscopy including intestinal bleeding and intestinal perforation.  Mr. Geiman has signed the operative permit.  PREMEDICATION:  Fentanyl 100 mcg, Versed 10 mg.  INSTRUMENT USED:  Olympus gastroscope.  DESCRIPTION OF PROCEDURE:  After obtaining informed consent, Mr. Serviss was placed in the left lateral decubitus position.  I administered intravenous fentanyl and intravenous Versed to achieve conscious sedation for the procedure.  The patients blood pressure and oxygen saturations and cardiac rhythm were monitored throughout the procedure and documented in the medical record.  The Olympus gastroscope was passed through the posterior hypopharynx into the proximal esophagus without difficulty.  The hypopharynx, larynx and vocal cords appeared normal.  Esophagoscopy:  The proximal, mid and lower segments of the esophagus appeared completely normal.  Endoscopically there is no signs of mucosal esophageal scarring, erosive esophagitis, Barretts esophagus, esophageal ulceration or esophageal obstruction.  Gastroscopy:  Retroflex view of the gastric cardia and fundus was normal.  The gastric body, antrum and pylorus appeared normal.  Duodenoscopy:  The duodenal bulb and descending duodenum appeared normal.  ASSESSMENT:  Normal esophagogastroduodenoscopy. DD:  06/01/01 TD:  06/01/01 Job:  [id number]

## 2011-05-15 ENCOUNTER — Ambulatory Visit (HOSPITAL_BASED_OUTPATIENT_CLINIC_OR_DEPARTMENT_OTHER)
Admission: RE | Admit: 2011-05-15 | Discharge: 2011-05-15 | Disposition: A | Discharge status: Home / Self Care | Payer: Medicare Other | Source: Ambulatory Visit | Attending: Urology | Admitting: Urology

## 2011-05-15 DIAGNOSIS — R109 Unspecified abdominal pain: Secondary | ICD-10-CM | POA: Insufficient documentation

## 2011-05-15 DIAGNOSIS — Z01810 Encounter for preprocedural cardiovascular examination: Secondary | ICD-10-CM | POA: Insufficient documentation

## 2011-05-15 DIAGNOSIS — Z01812 Encounter for preprocedural laboratory examination: Secondary | ICD-10-CM | POA: Insufficient documentation

## 2011-05-15 DIAGNOSIS — N201 Calculus of ureter: Secondary | ICD-10-CM | POA: Insufficient documentation

## 2011-05-15 HISTORY — PX: OTHER SURGICAL HISTORY: SHX169

## 2011-05-15 LAB — POCT I-STAT 4, (NA,K, GLUC, HGB,HCT)
Glucose, Bld: 106 mg/dL — ABNORMAL HIGH (ref 70–99)
HCT: 41 % (ref 39.0–52.0)
Potassium: 3.7 mEq/L (ref 3.5–5.1)
Sodium: 143 mEq/L (ref 135–145)

## 2011-05-19 NOTE — Op Note (Signed)
NAME:  [name], [name]           ACCOUNT NO.:  [id number]  MEDICAL RECORD NO.:  [id number]          PATIENT TYPE:  LOCATION:                                 FACILITY:  PHYSICIAN:  Excell Seltzer. Annabell Howells, M.D.         DATE OF BIRTH:  DATE OF PROCEDURE:  05/15/2011 DATE OF DISCHARGE:                              OPERATIVE REPORT   PROCEDURES:  Cystoscopy, right retrograde pyelogram with interpretation, right ureteroscopy with holmium lasertripsy, insertion of right double-J stent.  PREOPERATIVE DIAGNOSIS:  An 8-mm right ureteropelvic junction stone.  POSTOPERATIVE DIAGNOSIS:  An 8-mm right ureteropelvic junction stone.  SURGEON:  Excell Seltzer. Annabell Howells, M.D.  ANESTHESIA:  General.  SPECIMEN:  None.  DRAINS:  6-French 26-cm double-J stent.  COMPLICATIONS:  None.  INDICATIONS:  Mr. [name] is a 65 year old white male with a history of stones who presented recently with right flank pain.  He was found to have an 8-mm minimally radiopaque stone in the right UPJ.  He had had a prior negative experience with lithotripsy and has elected ureteroscopy for management.  FINDINGS AND PROCEDURE:  He was given Cipro.  He was taken to the operating room where a general anesthetic was induced.  He was placed in the lithotomy position.  His perineum and genitalia were prepped with Betadine solution and he was draped in the usual sterile fashion.  A time-out was performed.  Cystoscopy was performed using a 22-French scope and 12-degrees and 70-degrees lenses.  Examination revealed a mild bulbar stricture that easily admitted the scope.  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  It was about 2 to 3 cm in length.  Examination of the bladder revealed mild-to- moderate trabeculation with a small diverticulum just above the left ureteral orifice.  The ureteral orifices were otherwise unremarkable. No tumors, stones or inflammation were noted.  The right ureteral  orifice was cannulated with a 5-French open-ended catheter and contrast was instilled.  Right retrograde pyelogram revealed a normal ureter to the UPJ where there was a filling defect consistent with the stone.  The collecting system was minimally dilated proximal to the stone.  After completion of the retrograde pyelogram, a sensor wire was passed to the kidney through the open-end catheter.  This maneuver dislodged the stone into the lower pole calyx.  The cystoscope was then removed and a 38-cm Access sheath inner core was used to dilate the ureter without difficulty.  I then placed a 38-cm sheath, but it was insufficiently long to reach the kidney.  I then switched to the 52-cm sheath for the digital scope and was able to advance it into the UPJ.  The wire and dilator core were removed and the digital flexible ureteroscope was advanced through the sheath into the kidney.  The stone was identified in the lower pole.  The patient was noted on CT to have some additional stone material in the lower pole.  However, a thorough inspection of all the calices in the lower pole revealed no stone material, so I believe this finding on the CT was related to intertubular calcification.  Once the  inspection had been performed, the 200 micron laser fiber was inserted through the scope.  This was set on 0.5 watts and 20 Hz and the stone was engaged, it fragmented quite readily into 1-mm to 2-mm fragments.  At this point, an attempt was made to basket fragments with the nitinol basket.  However, a combination of very small fragment size and poor irrigation with turbid urine made fragment retrieval unsuccessful. However, it was felt that the fragments were sufficiently small that they should pass without difficulty.  At this point, the ureteroscope was removed.  A guidewire was passed to the sheath.  The sheath was removed.  The cystoscope was reinserted over the guidewire and a 6-French 26-cm  double-J stent with string was inserted into the kidney under fluoroscopic guidance, noting a good coil in the kidney and a good coil in the bladder after removal of the wire. The bladder was drained.  The cystoscope was removed, leaving the stent string exiting the urethra.  The string was secured to the patient's penis and a B and O suppository was placed.  His anesthetic was reversed.  He was moved to the recovery room in stable condition and there were no complications.     Excell Seltzer. Annabell Howells, M.D.     JJW/MEDQ  D:  05/15/2011  T:  05/15/2011  Job:  [id number]  Electronically Signed by Bjorn Pippin M.D. on 05/19/2011 04:54:09 PM

## 2011-06-30 ENCOUNTER — Other Ambulatory Visit: Payer: Self-pay | Admitting: Family Medicine

## 2011-06-30 MED ORDER — PAROXETINE HCL 40 MG PO TABS: 40.0000 mg | ORAL_TABLET | ORAL | Status: DC

## 2011-09-04 ENCOUNTER — Telehealth: Payer: Self-pay | Admitting: Family Medicine

## 2011-09-04 NOTE — Telephone Encounter (Signed)
I'm okay with it being drawn here if it is okay with [name].  He needs to bring in lab order, with labs requested and diagnosis codes; lab results will need to be faxed to Dr. Lequita Halt

## 2011-09-05 NOTE — Telephone Encounter (Signed)
PT INFORMED

## 2011-09-23 LAB — BASIC METABOLIC PANEL
BUN: 8
CO2: 31
Calcium: 10
Chloride: 101
Creatinine, Ser: 0.95

## 2011-09-23 LAB — CBC
MCHC: 33.9
MCV: 100.4 — ABNORMAL HIGH
Platelets: 217
RDW: 13.4

## 2011-09-29 ENCOUNTER — Encounter: Payer: Self-pay | Admitting: Family Medicine

## 2011-10-08 ENCOUNTER — Encounter: Payer: Self-pay | Admitting: Family Medicine

## 2011-10-09 ENCOUNTER — Telehealth: Payer: Self-pay | Admitting: Family Medicine

## 2011-10-09 MED ORDER — FLUTICASONE PROPIONATE HFA 110 MCG/ACT IN AERO: 1.0000 | INHALATION_SPRAY | Freq: Two times a day (BID) | RESPIRATORY_TRACT | Status: DC

## 2011-10-09 NOTE — Telephone Encounter (Signed)
Please review req for Flovent

## 2011-10-27 ENCOUNTER — Encounter: Payer: Self-pay | Admitting: *Deleted

## 2011-11-12 ENCOUNTER — Encounter: Payer: Self-pay | Admitting: Family Medicine

## 2011-11-12 ENCOUNTER — Ambulatory Visit (INDEPENDENT_AMBULATORY_CARE_PROVIDER_SITE_OTHER): Payer: Medicare Other | Admitting: Family Medicine

## 2011-11-12 DIAGNOSIS — Z23 Encounter for immunization: Secondary | ICD-10-CM

## 2011-11-12 DIAGNOSIS — E7800 Pure hypercholesterolemia, unspecified: Secondary | ICD-10-CM | POA: Insufficient documentation

## 2011-11-12 DIAGNOSIS — Z0000 Encounter for general adult medical examination without abnormal findings: Secondary | ICD-10-CM

## 2011-11-12 DIAGNOSIS — J45909 Unspecified asthma, uncomplicated: Secondary | ICD-10-CM

## 2011-11-12 DIAGNOSIS — Z125 Encounter for screening for malignant neoplasm of prostate: Secondary | ICD-10-CM

## 2011-11-12 LAB — COMPREHENSIVE METABOLIC PANEL
AST: 25 U/L (ref 0–37)
Alkaline Phosphatase: 70 U/L (ref 39–117)
BUN: 14 mg/dL (ref 6–23)
Calcium: 9.7 mg/dL (ref 8.4–10.5)
Chloride: 105 mEq/L (ref 96–112)
Creat: 0.97 mg/dL (ref 0.50–1.35)
Total Bilirubin: 0.6 mg/dL (ref 0.3–1.2)

## 2011-11-12 LAB — LIPID PANEL
HDL: 51 mg/dL (ref >39)
LDL Cholesterol: 169 mg/dL — ABNORMAL HIGH (ref 0–99)
Triglycerides: 88 mg/dL (ref <150)
VLDL: 18 mg/dL (ref 0–40)

## 2011-11-12 LAB — CBC WITH DIFFERENTIAL/PLATELET
Basophils Relative: 0 % (ref 0–1)
HCT: 42.6 % (ref 39.0–52.0)
Hemoglobin: 14.7 g/dL (ref 13.0–17.0)
Lymphocytes Relative: 26 % (ref 12–46)
MCHC: 34.5 g/dL (ref 30.0–36.0)
Monocytes Absolute: 0.3 10*3/uL (ref 0.1–1.0)
Monocytes Relative: 6 % (ref 3–12)
Neutro Abs: 2.8 10*3/uL (ref 1.7–7.7)

## 2011-11-12 LAB — POCT URINALYSIS DIPSTICK
Blood, UA: NEGATIVE
Nitrite, UA: NEGATIVE
Protein, UA: NEGATIVE
Urobilinogen, UA: NEGATIVE
pH, UA: 5

## 2011-11-12 NOTE — Patient Instructions (Signed)

## 2011-11-12 NOTE — Progress Notes (Signed)
[name] is a 65 y.o. male who presents for a complete physical.  He has the following concerns: Needs medical clearance for upcoming re-do hip replacement surgery on L hip.  Sees Dr. Lequita Halt. Originally had surgery 04/2010, and began having pain 2-3 months later. Denies any other problems or concerns.  Asthma--doing very well on Flovent.  Has a rescue inhaler, but never needs to use it. Kidney stone--had recurrent stone in May, required stent placement, and hasn't had any problems since. Panic disorder--doing well on Paxil.  Once he tried stopping it himself and had significant anxiety, also reports having tried cutting back on the dose, and had recurrent anxiety.  Has xanax that he sometimes needs in the evenings if he gets anxious.  Last filled by Dr. Zenaida Niece at [facility] in 06/2010 (rx'd 30 with 1 refill)  Immunization History  Administered Date(s) Administered  . Influenza Split 11/12/2011  . Pneumococcal Conjugate 05/22/2001  . Td 05/22/2001  . Tdap 09/11/2008   Last colonoscopy: 01/2010, normal per patient.  Gets every 5 years due to family history of cancer (Dr. Laural Benes) Last PSA: 10/2010 Dentist: twice yearly Ophtho: 2 years ago Exercise: daily --elliptical 20 minutes daily, ab machine 15 minutes daily, weights twice a week Last lipids were done at [facility] records available.  Review of [facility] records from 2010 shows chol 232, LDL 178, TG 83, HDL 48, ratio 4.83  Past Medical History  Diagnosis Date  . Panic disorder 2002  . Asthma   . Allergy   . Kidney stones     lithotripsy 01/2008 (Dr. Annabell Howells), cystoscopy and ureteroscopy 04/2020 (stone R UPJ)  . Pure hypercholesterolemia   . Alcohol abuse quit 04/20/1992  . DDD (degenerative disc disease), lumbar     Past Surgical History  Procedure Date  . Appendectomy age 89  . Tonsillectomy and adenoidectomy age 69  . Left heel spur surgery 1989  . Arthroscopic knee surgery 1990,repeated in 1996    cartilage injury Left   . C spine sugery 1993  . Nasal polyp surgery 1993,repeat 2006  . Elbow surgery right, 1995, repeated 1996  . Laparoscopic cholecystectomy 1998  . Arthroscopic shoulder surgery left,2009,repeat 2010  . 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 Lequita Halt  . Kidney stone 03/2009, 04/2011    removal (cystoscopically) Dr. Annabell Howells    History   Social History  . Marital Status: Divorced    Spouse Name: N/A    Number of Children: 2  . Years of Education: N/A   Occupational History  . retired from [employer]    Social History Main Topics  . Smoking status: Never Smoker   . Smokeless tobacco: Never Used  . Alcohol Use: No     quit drinking 04/20/1992  . Drug Use: No  . Sexually Active: Not Currently   Other Topics Concern  . Not on file   Social History Narrative   Lives alone, 1 dog.  Children are in [place], [place]    Family History  Problem Relation Age of Onset  . Hypertension Mother   . Anxiety disorder Mother   . Cancer Mother     male cancer  . Cancer Father     colon  . Panic disorder Daughter   . Diverticulosis Daughter   . Cancer Maternal Grandmother     breast and stomach  . Cancer Paternal Grandmother 35    colon cancer  . Alcohol abuse Brother   . Diabetes Neg Hx  Current outpatient prescriptions:Albuterol Sulfate (VENTOLIN HFA IN), Inhale 2 puffs into the lungs as needed.  , Disp: , Rfl: ;  fluticasone (FLOVENT HFA) 110 MCG/ACT inhaler, Inhale 1 puff into the lungs 2 (two) times daily., Disp: 1 Inhaler, Rfl: 2;  levocetirizine (XYZAL) 5 MG tablet, Take 5 mg by mouth every evening.  , Disp: , Rfl: ;  Multiple Vitamins-Minerals (CENTRUM SILVER PO), Take 1 tablet by mouth daily.  , Disp: , Rfl:  PARoxetine (PAXIL) 40 MG tablet, Take 1 tablet (40 mg total) by mouth every morning., Disp: 30 tablet, Rfl: 5;  ALPRAZolam (XANAX) 0.5 MG tablet, Take 0.5 mg by mouth 3 (three) times daily as needed.  , Disp: , Rfl:   No Known Allergies  ROS:  The patient denies anorexia,  fever, weight changes, headaches,  vision loss, decreased hearing, ear pain, hoarseness, chest pain, palpitations, dizziness, syncope, dyspnea on exertion, cough, swelling, nausea, vomiting, diarrhea, constipation, abdominal pain, melena, hematochezia, indigestion/heartburn, hematuria, incontinence, erectile dysfunction, nocturia, weakened urine stream, dysuria, genital lesions, numbness, tingling, weakness, tremor, suspicious skin lesions, depression, anxiety (controlled), abnormal bleeding/bruising, or enlarged lymph nodes +mild congestion, sinus problems now.  + L hip pain, some neck stiffness since surgery, some knee pain and some degenerative disk disease on low back with mild pain.  PHYSICAL EXAM:  BP 110/84  Pulse 64  Ht 6\' 3"  (1.905 m)  Wt 215 lb (97.523 kg)  BMI 26.87 kg/m2  General Appearance:    Alert, cooperative, no distress, appears stated age  Head:    Normocephalic, without obvious abnormality, atraumatic  Eyes:    PERRL, conjunctiva/corneas clear, EOM's intact, fundi    benign  Ears:    Normal TM's and external ear canals  Nose:   Nares normal, mucosa normal, no drainage or sinus   tenderness  Throat:   Lips, mucosa, and tongue normal; teeth and gums normal  Neck:   Supple, no lymphadenopathy;  thyroid:  no   enlargement/tenderness/nodules; no carotid   bruit or JVD  Back:    Spine nontender, no curvature, ROM normal, no CVA     tenderness  Lungs:     Clear to auscultation bilaterally without wheezes, rales or     ronchi; respirations unlabored  Chest Wall:    No tenderness or deformity   Heart:    Regular rate and rhythm, S1 and S2 normal, no murmur, rub   or gallop  Breast Exam:    No chest wall tenderness, masses or gynecomastia  Abdomen:     Soft, non-tender, nondistended, normoactive bowel sounds,    no masses, no hepatosplenomegaly  Genitalia:    Normal male external genitalia without lesions.  Testicles without masses.  No inguinal hernias.  Rectal:    Normal  sphincter tone, no masses or tenderness; guaiac negative stool.  Prostate smooth, no nodules, not enlarged.  Extremities:   No clubbing, cyanosis or edema  Pulses:   2+ and symmetric all extremities  Skin:   Skin color, texture, turgor normal, no rashes or lesions  Lymph nodes:   Cervical, supraclavicular, and axillary nodes normal  Neurologic:   CNII-XII intact, normal strength, sensation and gait; reflexes 2+ and symmetric throughout          Psych:   Normal mood, affect, hygiene and grooming.     PHYSICAL EXAM:  1. Routine general medical examination at a health care facility  POCT Urinalysis Dipstick, Visual acuity screening, CBC with Differential  2. Need for prophylactic vaccination and inoculation  against influenza  Flu vaccine greater than or equal to 3yo preservative free IM  3. Pure hypercholesterolemia  Comprehensive metabolic panel, Lipid panel  4. Special screening for malignant neoplasm of prostate  PSA, Medicare  5. ASTHMA     Discussed PSA screening (risks/benefits), recommended at least 30 minutes of aerobic activity at least 5 days/week; proper sunscreen use reviewed; healthy diet and alcohol recommendations (less than or equal to 2 drinks/day) reviewed; regular seatbelt use; changing batteries in smoke detectors. Self-testicular exams. Immunization recommendations discussed.  Colonoscopy recommendations reviewed--UTD.  Recommended pneumovax given his history of asthma, and last vaccine 10 years ago.  Patient prefers to wait a year until he is 69  NEED TO FAX CLEARANCE TO DR ALUISIO--[facility]

## 2011-11-17 ENCOUNTER — Telehealth: Payer: Self-pay | Admitting: *Deleted

## 2011-11-17 ENCOUNTER — Encounter: Payer: Medicare Other | Admitting: Family Medicine

## 2011-11-17 ENCOUNTER — Other Ambulatory Visit: Payer: Self-pay | Admitting: *Deleted

## 2011-11-17 DIAGNOSIS — E785 Hyperlipidemia, unspecified: Secondary | ICD-10-CM

## 2011-11-17 NOTE — Telephone Encounter (Signed)
Mailed patient low chol diet info.

## 2011-11-21 ENCOUNTER — Other Ambulatory Visit: Payer: Self-pay | Admitting: Orthopedic Surgery

## 2011-11-21 NOTE — H&P (Signed)
[name]  DOB: 12/28/1945  Date of Admission:  12/01/2011  Chief Complaint:  Left Hip Pain  History of Present Illness The patient is a 64 year old male who comes in today for a preoperative History and Physical. The patient is scheduled for a left Left Hip Revision to be performed by Dr. Frank V. Aluisio, MD at [facility] on 12/01/2011. The patient is a 64 year old male who presents for a recheck of Follow-up Hip. The patient is being followed for their left hip pain. Symptoms reported today include: pain and stiffness. The patient feels that they are doing poorly and report their pain level to be moderate. The following medication has been used for pain control: none. The patient presents today following MRI. [name], Sr. continues with the lateral hip pain. He also had blood work done showing an elevated cobalt level which was still below 7 and a normal chromium level. The patient is not having groin pain. It is hard for him to lie on his left side. It is hard for him to walk any distances.  Allergies No Known Drug Allergies  Medication History Paxil (40MG Tablet, Oral daily) Active. Xyzal (5MG Tablet, Oral daily) Active. Flovent HFA (110MCG/ACT Aerosol, Inhalation two times daily, as needed) Active.  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 04/08/2004 Pain in joint, pelvis/thigh (719.45). 04/25/2004 Disorder, shoulder region NEC (726.2). 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 09/02/2010 Anxiety Disorder Asthma Degenerative Disc Disease Diverticulitis Of Colon Kidney Stone Measles Mumps Gallbladder Problems History of Colonic Polyps Allergic Rhinitis Hyperlipidemia Benign Prostatic Hypertrophy History of Prostatitis  Past Surgical History Total Hip Replacement - Left Neck Disc Surgery. Date: 12/1991. Heel Surgery. Date: 11/1988. Knee Surgery. 09/1989 and  09/1995 Elbow Surgery. 12/1993 and 12/1994 Cholecystectomy. Date: 09/1997. Sinus Surgery. 11/1992 and 06/2005 Kidney Stone Surgery. 01/2008, 03/2010, 04/2011  Family History Father. Deceased, Colon Cancer. Age 73 Mother. Deceased, Ovarian Cancer. Age 62  Social History Tobacco use. Never smoker. No alcohol use Marital status. Divorced. Living situation. Lives alone. Post-Surgical Plans. Plan is to go home following surgery  Review of Systems General:Not Present- Chills, Fever, Night Sweats, Fatigue, Weight Gain, Weight Loss and Memory Loss. Skin:Not Present- Hives, Itching, Rash, Eczema and Lesions. HEENT:Not Present- Tinnitus, Headache, Double Vision, Visual Loss, Hearing Loss and Dentures. Respiratory:Not Present- Shortness of breath with exertion, Shortness of breath at rest, Allergies, Coughing up blood and Chronic Cough. Cardiovascular:Not Present- Chest Pain, Racing/skipping heartbeats, Difficulty Breathing Lying Down, Murmur, Swelling and Palpitations. Gastrointestinal:Not Present- Bloody Stool, Heartburn, Abdominal Pain, Vomiting, Nausea, Constipation, Diarrhea, Difficulty Swallowing, Jaundice and Loss of appetitie. Male Genitourinary:Not Present- Urinary frequency, Blood in Urine, Weak urinary stream, Discharge, Flank Pain, Incontinence, Painful Urination, Urgency, Urinary Retention and Urinating at Night. Musculoskeletal:Present- Joint Pain, Back Pain and Morning Stiffness. Not Present- Muscle Weakness, Muscle Pain, Joint Swelling and Spasms. Neurological:Not Present- Tremor, Dizziness, Blackout spells, Paralysis, Difficulty with balance and Weakness. Psychiatric:Not Present- Insomnia.  Vitals 11/21/2011 3:49 PM Weight: 210 lb Height: 76 in Body Surface Area: 2.26 m Body Mass Index: 25.56 kg/m Pulse: 64 (Regular) Resp.: 12 (Unlabored) BP: 108/70 (Sitting, Left Arm, Standard)  Physical Exam The physical exam findings are as  follows: Patient is a 64 yo white male, tall lean, frame who has been seen for ongoing left hip pain.  General Mental Status - Alert, cooperative and good historian. General Appearance- pleasant. Not in acute distress. Orientation- Oriented X3. Build & Nutrition- Well nourished   and Well developed.  Head and Neck Head- normocephalic, atraumatic . Neck Global Assessment- supple. no bruit auscultated on the right and no bruit auscultated on the left.  Eye Pupil- Bilateral- Regular and Round. Motion- Bilateral- EOMI.  Chest and Lung Exam Auscultation: Breath sounds:- clear at anterior chest wall and - clear at posterior chest wall. Adventitious sounds:- No Adventitious sounds.  Cardiovascular Auscultation:Rhythm- Regular rate and rhythm. Heart Sounds- S1 WNL and S2 WNL. Murmurs & Other Heart Sounds:Auscultation of the heart reveals - No Murmurs.  Abdomen Palpation/Percussion:Tenderness- Abdomen is non-tender to palpation. Rigidity (guarding)- Abdomen is soft. Auscultation:Auscultation of the abdomen reveals - Bowel sounds normal.  Male Genitourinary Note: Not done, not pertinent to present illness  Musculoskeletal Examination of the left hip, no palpable swelling. The patient has good range of motion of the hip. He is very tender around the trochanter. There is no instability noted of the hip.  IMAGES: MR Arthrogram shows he has fluid collection lateral to the greater trochanter and around the joint area. There is no apparent muscular destruction.  Assessment & Plan Failed Left Total Hip  Note: Plan is to undergo a Left Total Hip Revision per Dr. Aluisio.  Plan is to go home after the hospital stay.  Larry Perkins, PA-C    

## 2011-11-25 ENCOUNTER — Encounter (HOSPITAL_COMMUNITY): Payer: Self-pay | Admitting: Pharmacy Technician

## 2011-11-27 ENCOUNTER — Ambulatory Visit (HOSPITAL_COMMUNITY)
Admission: RE | Admit: 2011-11-27 | Discharge: 2011-11-27 | Disposition: A | Discharge status: Home / Self Care | Payer: Medicare Other | Source: Ambulatory Visit | Attending: Orthopedic Surgery | Admitting: Orthopedic Surgery

## 2011-11-27 ENCOUNTER — Encounter (HOSPITAL_COMMUNITY): Payer: Self-pay

## 2011-11-27 ENCOUNTER — Encounter (HOSPITAL_COMMUNITY)
Admission: RE | Admit: 2011-11-27 | Discharge: 2011-11-27 | Disposition: A | Discharge status: Home / Self Care | Payer: Medicare Other | Source: Ambulatory Visit | Attending: Orthopedic Surgery | Admitting: Orthopedic Surgery

## 2011-11-27 DIAGNOSIS — Z01812 Encounter for preprocedural laboratory examination: Secondary | ICD-10-CM | POA: Insufficient documentation

## 2011-11-27 DIAGNOSIS — Z96649 Presence of unspecified artificial hip joint: Secondary | ICD-10-CM | POA: Insufficient documentation

## 2011-11-27 DIAGNOSIS — Z01818 Encounter for other preprocedural examination: Secondary | ICD-10-CM | POA: Insufficient documentation

## 2011-11-27 HISTORY — DX: Anxiety disorder, unspecified: F41.9

## 2011-11-27 LAB — CBC
MCH: 33.2 pg (ref 26.0–34.0)
MCHC: 35 g/dL (ref 30.0–36.0)
MCV: 94.8 fL (ref 78.0–100.0)
Platelets: 191 10*3/uL (ref 150–400)
RDW: 12.5 % (ref 11.5–15.5)

## 2011-11-27 LAB — URINALYSIS, ROUTINE W REFLEX MICROSCOPIC
Ketones, ur: NEGATIVE mg/dL
Leukocytes, UA: NEGATIVE
Nitrite: NEGATIVE
Specific Gravity, Urine: 1.024 (ref 1.005–1.030)
Urobilinogen, UA: 1 mg/dL (ref 0.0–1.0)
pH: 6 (ref 5.0–8.0)

## 2011-11-27 LAB — COMPREHENSIVE METABOLIC PANEL
AST: 17 U/L (ref 0–37)
Albumin: 3.9 g/dL (ref 3.5–5.2)
Calcium: 9.2 mg/dL (ref 8.4–10.5)
Creatinine, Ser: 1 mg/dL (ref 0.50–1.35)

## 2011-11-27 LAB — PROTIME-INR: INR: 1.08 (ref 0.00–1.49)

## 2011-11-27 LAB — SURGICAL PCR SCREEN: Staphylococcus aureus: NEGATIVE

## 2011-11-27 MED ORDER — CHLORHEXIDINE GLUCONATE 4 % EX LIQD: 60.0000 mL | Freq: Once | CUTANEOUS | Status: DC

## 2011-11-27 NOTE — Patient Instructions (Signed)
20 [name]  11/27/2011   Your procedure is scheduled on:  Monday  12/10  AT 2:10 PM  Report to [facility] at 11:30 AM.  Call this number if you have problems the morning of surgery: [phone]   Remember:   Do not eat food:After Midnight.  May have clear liquids FROM MIDNIGHT UNTIL 8:00 AM  Clear liquids include soda, tea, black coffee, apple or grape juice.  Take these medicines the morning of surgery with A SIP OF WATER:  TAKE PAXIL.  USE FLOVENT INHALER IF NEEDED.  BRING BOTH INHALERS TO HOSPITAL--TAKE ALBUTEROL INHALER TO OR   Do not wear jewelry, make-up or nail polish.  Do not wear lotions, powders, or perfumes. You may wear deodorant.  Do not shave 48 hours prior to surgery.  Do not bring valuables to the hospital.  Contacts, dentures or bridgework may not be worn into surgery.  Leave suitcase in the car. After surgery it may be brought to your room.  For patients admitted to the hospital, checkout time is 11:00 AM the day of discharge.   Patients discharged the day of surgery will not be allowed to drive home.    Special Instructions: CHG Shower Use Special Wash: 1/2 bottle night before surgery and 1/2 bottle morning of surgery.   Please read over the following fact sheets that you were given: Blood Transfusion Information and MRSA Information AND INCENTIVE SPIROMETER INSTRUCTIONS

## 2011-11-27 NOTE — Pre-Procedure Instructions (Signed)
PT HAS EKG REPORT ON THIS CHART FROM MAY 24. 2012 -DONE AT [facility]  PT DOES NOT NEED CXR PREOP PER ANESTHESIOLOGY GUIDELINES--DID PUT A COPY OF HIS MOST RECENT CXR  REPORT FROM [facility] - DONE 04/22/10. HX OF CERVICAL FUSION IN THE PAST--LIMITED ROM OF HIS NECK--BUT PT STATES NOT AWARE OF ANY INTUBATION PROBLEMS WITH THE MULTIPLE SURGERIES HE HAS HAD SINCE THE FUSION.  COPIES OF PT'S LAST 2 ANESTHESIA RECORDS ARE ON THIS CHART.

## 2011-12-01 ENCOUNTER — Encounter (HOSPITAL_COMMUNITY): Payer: Self-pay | Admitting: Anesthesiology

## 2011-12-01 ENCOUNTER — Encounter (HOSPITAL_COMMUNITY)
Admission: RE | Disposition: A | Discharge status: Home Health | Payer: Self-pay | Source: Ambulatory Visit | Attending: Orthopedic Surgery

## 2011-12-01 ENCOUNTER — Other Ambulatory Visit: Payer: Self-pay | Admitting: Orthopedic Surgery

## 2011-12-01 ENCOUNTER — Inpatient Hospital Stay (HOSPITAL_COMMUNITY): Payer: Medicare Other | Admitting: Anesthesiology

## 2011-12-01 ENCOUNTER — Encounter (HOSPITAL_COMMUNITY): Payer: Self-pay | Admitting: *Deleted

## 2011-12-01 ENCOUNTER — Inpatient Hospital Stay (HOSPITAL_COMMUNITY)
Admission: RE | Admit: 2011-12-01 | Discharge: 2011-12-03 | DRG: 468 | Disposition: A | Discharge status: Home Health | Payer: Medicare Other | Source: Ambulatory Visit | Attending: Orthopedic Surgery | Admitting: Orthopedic Surgery

## 2011-12-01 ENCOUNTER — Inpatient Hospital Stay (HOSPITAL_COMMUNITY): Payer: Medicare Other

## 2011-12-01 DIAGNOSIS — J309 Allergic rhinitis, unspecified: Secondary | ICD-10-CM | POA: Diagnosis present

## 2011-12-01 DIAGNOSIS — Y831 Surgical operation with implant of artificial internal device as the cause of abnormal reaction of the patient, or of later complication, without mention of misadventure at the time of the procedure: Secondary | ICD-10-CM | POA: Diagnosis present

## 2011-12-01 DIAGNOSIS — IMO0002 Reserved for concepts with insufficient information to code with codable children: Secondary | ICD-10-CM | POA: Diagnosis present

## 2011-12-01 DIAGNOSIS — T84099A Other mechanical complication of unspecified internal joint prosthesis, initial encounter: Principal | ICD-10-CM | POA: Diagnosis present

## 2011-12-01 DIAGNOSIS — K5730 Diverticulosis of large intestine without perforation or abscess without bleeding: Secondary | ICD-10-CM | POA: Diagnosis present

## 2011-12-01 DIAGNOSIS — F410 Panic disorder [episodic paroxysmal anxiety] without agoraphobia: Secondary | ICD-10-CM | POA: Diagnosis present

## 2011-12-01 DIAGNOSIS — F411 Generalized anxiety disorder: Secondary | ICD-10-CM | POA: Diagnosis present

## 2011-12-01 DIAGNOSIS — E785 Hyperlipidemia, unspecified: Secondary | ICD-10-CM | POA: Diagnosis present

## 2011-12-01 DIAGNOSIS — N400 Enlarged prostate without lower urinary tract symptoms: Secondary | ICD-10-CM | POA: Diagnosis present

## 2011-12-01 DIAGNOSIS — Z87442 Personal history of urinary calculi: Secondary | ICD-10-CM

## 2011-12-01 DIAGNOSIS — J45909 Unspecified asthma, uncomplicated: Secondary | ICD-10-CM | POA: Diagnosis present

## 2011-12-01 DIAGNOSIS — Z96649 Presence of unspecified artificial hip joint: Secondary | ICD-10-CM

## 2011-12-01 DIAGNOSIS — Z79899 Other long term (current) drug therapy: Secondary | ICD-10-CM

## 2011-12-01 DIAGNOSIS — M25559 Pain in unspecified hip: Secondary | ICD-10-CM

## 2011-12-01 HISTORY — PX: TOTAL HIP REVISION: SHX763

## 2011-12-01 LAB — GRAM STAIN

## 2011-12-01 LAB — TYPE AND SCREEN

## 2011-12-01 SURGERY — TOTAL HIP REVISION
Anesthesia: General | Site: Hip | Laterality: Left | Wound class: Clean

## 2011-12-01 MED ORDER — ALPRAZOLAM 0.5 MG PO TABS: 0.5000 mg | ORAL_TABLET | Freq: Three times a day (TID) | ORAL | Status: DC | PRN

## 2011-12-01 MED ORDER — ACETAMINOPHEN 650 MG RE SUPP: 650.0000 mg | Freq: Four times a day (QID) | RECTAL | Status: DC | PRN

## 2011-12-01 MED ORDER — BUPIVACAINE 0.25 % ON-Q PUMP SINGLE CATH 300ML
300.0000 mL | INJECTION | Status: DC
  Filled 2011-12-01: qty 300

## 2011-12-01 MED ORDER — LEVOCETIRIZINE DIHYDROCHLORIDE 5 MG PO TABS
5.0000 mg | ORAL_TABLET | Freq: Every day | ORAL | Status: DC
  Administered 2011-12-03: 5 mg via ORAL
  Filled 2011-12-01 (×2): qty 1

## 2011-12-01 MED ORDER — PROMETHAZINE HCL 25 MG/ML IJ SOLN: 6.2500 mg | INTRAMUSCULAR | Status: DC | PRN

## 2011-12-01 MED ORDER — METOCLOPRAMIDE HCL 5 MG/ML IJ SOLN: 5.0000 mg | Freq: Three times a day (TID) | INTRAMUSCULAR | Status: DC | PRN

## 2011-12-01 MED ORDER — RIVAROXABAN 10 MG PO TABS
10.0000 mg | ORAL_TABLET | Freq: Every day | ORAL | Status: DC
  Administered 2011-12-02 – 2011-12-03 (×2): 10 mg via ORAL
  Filled 2011-12-01 (×2): qty 1

## 2011-12-01 MED ORDER — FLUTICASONE PROPIONATE HFA 110 MCG/ACT IN AERO
1.0000 | INHALATION_SPRAY | Freq: Two times a day (BID) | RESPIRATORY_TRACT | Status: DC | PRN
  Filled 2011-12-01: qty 12

## 2011-12-01 MED ORDER — LEVOCETIRIZINE DIHYDROCHLORIDE 5 MG PO TABS
5.0000 mg | ORAL_TABLET | ORAL | Status: DC
  Filled 2011-12-01: qty 1

## 2011-12-01 MED ORDER — LACTATED RINGERS IV SOLN
INTRAVENOUS | Status: DC
  Administered 2011-12-01 (×2): via INTRAVENOUS
  Administered 2011-12-01: 1000 mL via INTRAVENOUS

## 2011-12-01 MED ORDER — METHOCARBAMOL 500 MG PO TABS
500.0000 mg | ORAL_TABLET | Freq: Four times a day (QID) | ORAL | Status: DC | PRN
  Administered 2011-12-02: 500 mg via ORAL
  Filled 2011-12-01 (×2): qty 1

## 2011-12-01 MED ORDER — MENTHOL 3 MG MT LOZG: 1.0000 | LOZENGE | OROMUCOSAL | Status: DC | PRN

## 2011-12-01 MED ORDER — DOCUSATE SODIUM 100 MG PO CAPS
100.0000 mg | ORAL_CAPSULE | Freq: Two times a day (BID) | ORAL | Status: DC
  Administered 2011-12-01 – 2011-12-03 (×4): 100 mg via ORAL
  Filled 2011-12-01 (×5): qty 1

## 2011-12-01 MED ORDER — HYDROMORPHONE HCL PF 1 MG/ML IJ SOLN
0.2500 mg | INTRAMUSCULAR | Status: DC | PRN
  Administered 2011-12-01: 0.5 mg via INTRAVENOUS

## 2011-12-01 MED ORDER — OXYCODONE HCL 5 MG PO TABS
5.0000 mg | ORAL_TABLET | ORAL | Status: DC | PRN
  Administered 2011-12-02 (×3): 10 mg via ORAL
  Filled 2011-12-01 (×4): qty 2

## 2011-12-01 MED ORDER — FENTANYL CITRATE 0.05 MG/ML IJ SOLN
INTRAMUSCULAR | Status: DC | PRN
  Administered 2011-12-01: 100 ug via INTRAVENOUS
  Administered 2011-12-01 (×2): 50 ug via INTRAVENOUS

## 2011-12-01 MED ORDER — CEFAZOLIN SODIUM 1-5 GM-% IV SOLN
1.0000 g | Freq: Four times a day (QID) | INTRAVENOUS | Status: AC
  Administered 2011-12-01 – 2011-12-02 (×3): 1 g via INTRAVENOUS
  Filled 2011-12-01 (×3): qty 50

## 2011-12-01 MED ORDER — PAROXETINE HCL 20 MG PO TABS
40.0000 mg | ORAL_TABLET | Freq: Every day | ORAL | Status: DC
  Administered 2011-12-02 – 2011-12-03 (×2): 40 mg via ORAL
  Filled 2011-12-01 (×2): qty 2

## 2011-12-01 MED ORDER — DEXTROSE-NACL 5-0.9 % IV SOLN
INTRAVENOUS | Status: DC
  Administered 2011-12-01 – 2011-12-02 (×3): via INTRAVENOUS

## 2011-12-01 MED ORDER — CEFAZOLIN SODIUM-DEXTROSE 2-3 GM-% IV SOLR
2.0000 g | Freq: Once | INTRAVENOUS | Status: AC
  Administered 2011-12-01: 2 g via INTRAVENOUS
  Filled 2011-12-01: qty 50

## 2011-12-01 MED ORDER — METOCLOPRAMIDE HCL 10 MG PO TABS: 5.0000 mg | ORAL_TABLET | Freq: Three times a day (TID) | ORAL | Status: DC | PRN

## 2011-12-01 MED ORDER — NEOSTIGMINE METHYLSULFATE 1 MG/ML IJ SOLN
INTRAMUSCULAR | Status: DC | PRN
  Administered 2011-12-01: 5 mg via INTRAVENOUS

## 2011-12-01 MED ORDER — ONDANSETRON HCL 4 MG PO TABS: 4.0000 mg | ORAL_TABLET | Freq: Four times a day (QID) | ORAL | Status: DC | PRN

## 2011-12-01 MED ORDER — LIDOCAINE HCL (CARDIAC) 20 MG/ML IV SOLN
INTRAVENOUS | Status: DC | PRN
  Administered 2011-12-01: 100 mg via INTRAVENOUS

## 2011-12-01 MED ORDER — ACETAMINOPHEN 10 MG/ML IV SOLN
INTRAVENOUS | Status: DC | PRN
  Administered 2011-12-01: 1000 mg via INTRAVENOUS

## 2011-12-01 MED ORDER — ALBUTEROL SULFATE HFA 108 (90 BASE) MCG/ACT IN AERS
2.0000 | INHALATION_SPRAY | Freq: Four times a day (QID) | RESPIRATORY_TRACT | Status: DC | PRN
  Filled 2011-12-01: qty 6.7

## 2011-12-01 MED ORDER — GLYCOPYRROLATE 0.2 MG/ML IJ SOLN
INTRAMUSCULAR | Status: DC | PRN
  Administered 2011-12-01: .6 mg via INTRAVENOUS
  Administered 2011-12-01: 0.2 mg via INTRAVENOUS

## 2011-12-01 MED ORDER — ACETAMINOPHEN 325 MG PO TABS: 650.0000 mg | ORAL_TABLET | Freq: Four times a day (QID) | ORAL | Status: DC | PRN

## 2011-12-01 MED ORDER — DEXAMETHASONE SODIUM PHOSPHATE 4 MG/ML IJ SOLN
INTRAMUSCULAR | Status: DC | PRN
  Administered 2011-12-01: 10 mg via INTRAVENOUS

## 2011-12-01 MED ORDER — HYDROMORPHONE HCL PF 1 MG/ML IJ SOLN
INTRAMUSCULAR | Status: AC
  Administered 2011-12-01: 0.5 mg via INTRAVENOUS
  Filled 2011-12-01: qty 1

## 2011-12-01 MED ORDER — ACETAMINOPHEN 10 MG/ML IV SOLN
1000.0000 mg | Freq: Four times a day (QID) | INTRAVENOUS | Status: AC
  Administered 2011-12-01 – 2011-12-02 (×4): 1000 mg via INTRAVENOUS
  Filled 2011-12-01 (×5): qty 100

## 2011-12-01 MED ORDER — PHENOL 1.4 % MT LIQD: 1.0000 | OROMUCOSAL | Status: DC | PRN

## 2011-12-01 MED ORDER — MORPHINE SULFATE 2 MG/ML IJ SOLN: 1.0000 mg | INTRAMUSCULAR | Status: DC | PRN

## 2011-12-01 MED ORDER — PROPOFOL 10 MG/ML IV BOLUS
INTRAVENOUS | Status: DC | PRN
  Administered 2011-12-01: 180 mg via INTRAVENOUS

## 2011-12-01 MED ORDER — TEMAZEPAM 15 MG PO CAPS
15.0000 mg | ORAL_CAPSULE | Freq: Every evening | ORAL | Status: DC | PRN
  Administered 2011-12-02: 30 mg via ORAL
  Administered 2011-12-02: 15 mg via ORAL
  Filled 2011-12-01: qty 1
  Filled 2011-12-01: qty 2

## 2011-12-01 MED ORDER — ONDANSETRON HCL 4 MG/2ML IJ SOLN
INTRAMUSCULAR | Status: DC | PRN
  Administered 2011-12-01: 4 mg via INTRAVENOUS

## 2011-12-01 MED ORDER — DIPHENHYDRAMINE HCL 12.5 MG/5ML PO ELIX: 12.5000 mg | ORAL_SOLUTION | ORAL | Status: DC | PRN

## 2011-12-01 MED ORDER — ROCURONIUM BROMIDE 100 MG/10ML IV SOLN
INTRAVENOUS | Status: DC | PRN
  Administered 2011-12-01: 50 mg via INTRAVENOUS
  Administered 2011-12-01: 15 mg via INTRAVENOUS
  Administered 2011-12-01: 10 mg via INTRAVENOUS

## 2011-12-01 MED ORDER — ONDANSETRON HCL 4 MG/2ML IJ SOLN: 4.0000 mg | Freq: Four times a day (QID) | INTRAMUSCULAR | Status: DC | PRN

## 2011-12-01 MED ORDER — LACTATED RINGERS IV SOLN: INTRAVENOUS | Status: DC

## 2011-12-01 MED ORDER — METHOCARBAMOL 100 MG/ML IJ SOLN
500.0000 mg | Freq: Four times a day (QID) | INTRAVENOUS | Status: DC | PRN
  Filled 2011-12-01: qty 5

## 2011-12-01 MED ORDER — SODIUM CHLORIDE 0.9 % IR SOLN
Status: DC | PRN
  Administered 2011-12-01: 1000 mL

## 2011-12-01 MED ORDER — MIDAZOLAM HCL 5 MG/5ML IJ SOLN
INTRAMUSCULAR | Status: DC | PRN
  Administered 2011-12-01: 2 mg via INTRAVENOUS

## 2011-12-01 SURGICAL SUPPLY — 63 items
BAG ZIPLOCK 12X15 (MISCELLANEOUS) ×6 IMPLANT
BIT DRILL 2.8X128 (BIT) ×2 IMPLANT
BLADE EXTENDED COATED 6.5IN (ELECTRODE) ×2 IMPLANT
BLADE SAW SAG 73X25 THK (BLADE) ×1
BLADE SAW SGTL 73X25 THK (BLADE) ×1 IMPLANT
CATH KIT ON-Q SILVERSOAK 5IN (CATHETERS) ×2 IMPLANT
CLOSURE STERI STRIP 1/2 X4 (GAUZE/BANDAGES/DRESSINGS) ×2 IMPLANT
CLOTH BEACON ORANGE TIMEOUT ST (SAFETY) ×2 IMPLANT
CONT SPECI 4OZ STER CLIK (MISCELLANEOUS) ×2 IMPLANT
DRAPE INCISE IOBAN 66X45 STRL (DRAPES) ×2 IMPLANT
DRAPE ORTHO SPLIT 77X108 STRL (DRAPES) ×2
DRAPE POUCH INSTRU U-SHP 10X18 (DRAPES) ×2 IMPLANT
DRAPE SURG ORHT 6 SPLT 77X108 (DRAPES) ×2 IMPLANT
DRAPE U-SHAPE 47X51 STRL (DRAPES) ×2 IMPLANT
DRSG ADAPTIC 3X8 NADH LF (GAUZE/BANDAGES/DRESSINGS) ×2 IMPLANT
DRSG EMULSION OIL 3X16 NADH (GAUZE/BANDAGES/DRESSINGS) ×2 IMPLANT
DRSG MEPILEX BORDER 4X4 (GAUZE/BANDAGES/DRESSINGS) ×4 IMPLANT
DRSG MEPILEX BORDER 4X8 (GAUZE/BANDAGES/DRESSINGS) ×2 IMPLANT
DURAPREP 26ML APPLICATOR (WOUND CARE) ×2 IMPLANT
ELECT REM PT RETURN 9FT ADLT (ELECTROSURGICAL) ×2
ELECTRODE REM PT RTRN 9FT ADLT (ELECTROSURGICAL) ×1 IMPLANT
EVACUATOR 1/8 PVC DRAIN (DRAIN) ×2 IMPLANT
FACESHIELD LNG OPTICON STERILE (SAFETY) ×8 IMPLANT
GAUZE SPONGE 4X4 12PLY STRL LF (GAUZE/BANDAGES/DRESSINGS) ×2 IMPLANT
GLOVE BIO SURGEON STRL SZ7.5 (GLOVE) ×2 IMPLANT
GLOVE BIO SURGEON STRL SZ8 (GLOVE) ×2 IMPLANT
GLOVE BIOGEL PI IND STRL 6.5 (GLOVE) ×2 IMPLANT
GLOVE BIOGEL PI IND STRL 8 (GLOVE) ×2 IMPLANT
GLOVE BIOGEL PI INDICATOR 6.5 (GLOVE) ×2
GLOVE BIOGEL PI INDICATOR 8 (GLOVE) ×2
GLOVE SURG SS PI 6.5 STRL IVOR (GLOVE) ×2 IMPLANT
GLOVE SURG SS PI 7.5 STRL IVOR (GLOVE) ×4 IMPLANT
GOWN STRL NON-REIN LRG LVL3 (GOWN DISPOSABLE) ×6 IMPLANT
GOWN STRL REIN XL XLG (GOWN DISPOSABLE) ×4 IMPLANT
HEAD CERAMIC BIOLOX DELTA 36 6 (Hips) ×2 IMPLANT
IMMOBILIZER KNEE 20 (SOFTGOODS)
IMMOBILIZER KNEE 20 THIGH 36 (SOFTGOODS) IMPLANT
KIT BASIN OR (CUSTOM PROCEDURE TRAY) ×2 IMPLANT
LINER MARATHON NEUT +4X60X36 (Hips) ×2 IMPLANT
MANIFOLD NEPTUNE II (INSTRUMENTS) ×2 IMPLANT
NDL SAFETY ECLIPSE 18X1.5 (NEEDLE) IMPLANT
NEEDLE HYPO 18GX1.5 SHARP (NEEDLE)
NS IRRIG 1000ML POUR BTL (IV SOLUTION) ×2 IMPLANT
PACK TOTAL JOINT (CUSTOM PROCEDURE TRAY) ×2 IMPLANT
PASSER SUT SWANSON 36MM LOOP (INSTRUMENTS) ×2 IMPLANT
POSITIONER SURGICAL ARM (MISCELLANEOUS) ×2 IMPLANT
SPONGE GAUZE 4X4 12PLY (GAUZE/BANDAGES/DRESSINGS) ×2 IMPLANT
SPONGE LAP 18X18 X RAY DECT (DISPOSABLE) ×2 IMPLANT
SROM FEM STEM STD 20X15 36+12 (Hips) ×2 IMPLANT
STAPLER VISISTAT 35W (STAPLE) ×2 IMPLANT
STEM FEM SROM STD 20X15 36+12 (Hips) ×1 IMPLANT
SUCTION FRAZIER TIP 10 FR DISP (SUCTIONS) ×2 IMPLANT
SUT ETHIBOND NAB CT1 #1 30IN (SUTURE) ×4 IMPLANT
SUT VIC AB 1 CT1 27 (SUTURE) ×3
SUT VIC AB 1 CT1 27XBRD ANTBC (SUTURE) ×3 IMPLANT
SUT VIC AB 2-0 CT1 27 (SUTURE) ×3
SUT VIC AB 2-0 CT1 TAPERPNT 27 (SUTURE) ×3 IMPLANT
SWAB COLLECTION DEVICE MRSA (MISCELLANEOUS) ×2 IMPLANT
SYR 50ML LL SCALE MARK (SYRINGE) IMPLANT
TOWEL OR 17X26 10 PK STRL BLUE (TOWEL DISPOSABLE) ×4 IMPLANT
TRAY FOLEY CATH 14FRSI W/METER (CATHETERS) ×2 IMPLANT
TUBE ANAEROBIC SPECIMEN COL (MISCELLANEOUS) ×2 IMPLANT
WATER STERILE IRR 1500ML POUR (IV SOLUTION) ×2 IMPLANT

## 2011-12-01 NOTE — Plan of Care (Signed)
Problem: Consults Goal: Diagnosis- Total Joint Replacement Outcome: Completed/Met Date Met:  12/01/11 Revision Total Hip

## 2011-12-01 NOTE — Interval H&P Note (Signed)
History and Physical Interval Note:  12/01/2011 2:17 PM  [name]  has presented today for surgery, with the diagnosis of left hip metal bearing failure  The various methods of treatment have been discussed with the patient and family. After consideration of risks, benefits and other options for treatment, the patient has consented to  Procedure(s): TOTAL HIP REVISION as a surgical intervention .  The patients' history has been reviewed, patient examined, no change in status, stable for surgery.  I have reviewed the patients' chart and labs.  Questions were answered to the patient's satisfaction.     Loanne Drilling

## 2011-12-01 NOTE — Anesthesia Postprocedure Evaluation (Signed)
  Anesthesia Post-op Note  Patient: [name]  Procedure(s) Performed:  TOTAL HIP REVISION  Patient Location: PACU  Anesthesia Type: General  Level of Consciousness: awake and alert   Airway and Oxygen Therapy: Patient Spontanous Breathing  Post-op Pain: mild  Post-op Assessment: Post-op Vital signs reviewed, Patient's Cardiovascular Status Stable, Respiratory Function Stable, Patent Airway and No signs of Nausea or vomiting  Post-op Vital Signs: stable  Complications: No apparent anesthesia complications

## 2011-12-01 NOTE — Transfer of Care (Signed)
Immediate Anesthesia Transfer of Care Note  Patient: [name]  Procedure(s) Performed:  TOTAL HIP REVISION  Patient Location: PACU  Anesthesia Type: General  Level of Consciousness: awake, alert  and oriented  Airway & Oxygen Therapy: Patient Spontanous Breathing and Patient connected to face mask oxygen  Post-op Assessment: Report given to PACU RN, Post -op Vital signs reviewed and stable and Patient moving all extremities  Post vital signs: Reviewed and stable  Complications: No apparent anesthesia complications

## 2011-12-01 NOTE — H&P (View-Only) (Signed)
[name]  DOB: [birth year]  Date of Admission:  12/01/2011  Chief Complaint:  Left Hip Pain  History of Present Illness The patient is a 65 year old male who comes in today for a preoperative History and Physical. The patient is scheduled for a left Left Hip Revision to be performed by Dr. Gus Rankin. Aluisio, MD at [facility] on 12/01/2011. The patient is a 65 year old male who presents for a recheck of Follow-up Hip. The patient is being followed for their left hip pain. Symptoms reported today include: pain and stiffness. The patient feels that they are doing poorly and report their pain level to be moderate. The following medication has been used for pain control: none. The patient presents today following MRI. [name], Sr. continues with the lateral hip pain. He also had blood work done showing an elevated cobalt level which was still below 7 and a normal chromium level. The patient is not having groin pain. It is hard for him to lie on his left side. It is hard for him to walk any distances.  Allergies No Known Drug Allergies  Medication History Paxil (40MG  Tablet, Oral daily) Active. Xyzal (5MG  Tablet, Oral daily) Active. Flovent HFA (110MCG/ACT Aerosol, Inhalation two times daily, as needed) Active.  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 04/08/2004 Pain in joint, pelvis/thigh (719.45). 04/25/2004 Disorder, shoulder region NEC (726.2). 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 09/02/2010 Anxiety Disorder Asthma Degenerative Disc Disease Diverticulitis Of Colon Kidney Stone Measles Mumps Gallbladder Problems History of Colonic Polyps Allergic Rhinitis Hyperlipidemia Benign Prostatic Hypertrophy History of Prostatitis  Past Surgical History Total Hip Replacement - Left Neck Disc Surgery. Date: 12/1991. Heel Surgery. Date: 11/1988. Knee Surgery. 09/1989 and  09/1995 Elbow Surgery. 12/1993 and 12/1994 Cholecystectomy. Date: 09/1997. Sinus Surgery. 11/1992 and 06/2005 Kidney Stone Surgery. 01/2008, 03/2010, 04/2011  Family History Father. Deceased, Colon Cancer. Age 38 Mother. Deceased, Ovarian Cancer. Age 62  Social History Tobacco use. Never smoker. No alcohol use Marital status. Divorced. Living situation. Lives alone. Post-Surgical Plans. Plan is to go home following surgery  Review of Systems General:Not Present- Chills, Fever, Night Sweats, Fatigue, Weight Gain, Weight Loss and Memory Loss. Skin:Not Present- Hives, Itching, Rash, Eczema and Lesions. HEENT:Not Present- Tinnitus, Headache, Double Vision, Visual Loss, Hearing Loss and Dentures. Respiratory:Not Present- Shortness of breath with exertion, Shortness of breath at rest, Allergies, Coughing up blood and Chronic Cough. Cardiovascular:Not Present- Chest Pain, Racing/skipping heartbeats, Difficulty Breathing Lying Down, Murmur, Swelling and Palpitations. Gastrointestinal:Not Present- Bloody Stool, Heartburn, Abdominal Pain, Vomiting, Nausea, Constipation, Diarrhea, Difficulty Swallowing, Jaundice and Loss of appetitie. Male Genitourinary:Not Present- Urinary frequency, Blood in Urine, Weak urinary stream, Discharge, Flank Pain, Incontinence, Painful Urination, Urgency, Urinary Retention and Urinating at Night. Musculoskeletal:Present- Joint Pain, Back Pain and Morning Stiffness. Not Present- Muscle Weakness, Muscle Pain, Joint Swelling and Spasms. Neurological:Not Present- Tremor, Dizziness, Blackout spells, Paralysis, Difficulty with balance and Weakness. Psychiatric:Not Present- Insomnia.  Vitals 11/21/2011 3:49 PM Weight: 210 lb Height: 76 in Body Surface Area: 2.26 m Body Mass Index: 25.56 kg/m Pulse: 64 (Regular) Resp.: 12 (Unlabored) BP: 108/70 (Sitting, Left Arm, Standard)  Physical Exam The physical exam findings are as  follows: Patient is a 65 yo white male, tall lean, frame who has been seen for ongoing left hip pain.  General Mental Status - Alert, cooperative and good historian. General Appearance- pleasant. Not in acute distress. Orientation- Oriented X3. Build & Nutrition- Well nourished  and Well developed.  Head and Neck Head- normocephalic, atraumatic . Neck Global Assessment- supple. no bruit auscultated on the right and no bruit auscultated on the left.  Eye Pupil- Bilateral- Regular and Round. Motion- Bilateral- EOMI.  Chest and Lung Exam Auscultation: Breath sounds:- clear at anterior chest wall and - clear at posterior chest wall. Adventitious sounds:- No Adventitious sounds.  Cardiovascular Auscultation:Rhythm- Regular rate and rhythm. Heart Sounds- S1 WNL and S2 WNL. Murmurs & Other Heart Sounds:Auscultation of the heart reveals - No Murmurs.  Abdomen Palpation/Percussion:Tenderness- Abdomen is non-tender to palpation. Rigidity (guarding)- Abdomen is soft. Auscultation:Auscultation of the abdomen reveals - Bowel sounds normal.  Male Genitourinary Note: Not done, not pertinent to present illness  Musculoskeletal Examination of the left hip, no palpable swelling. The patient has good range of motion of the hip. He is very tender around the trochanter. There is no instability noted of the hip.  IMAGES: MR Arthrogram shows he has fluid collection lateral to the greater trochanter and around the joint area. There is no apparent muscular destruction.  Assessment & Plan Failed Left Total Hip  Note: Plan is to undergo a Left Total Hip Revision per Dr. Lequita Halt.  Plan is to go home after the hospital stay.  [name], PA-C

## 2011-12-01 NOTE — Progress Notes (Signed)
Pt's belongings( suitcase and bag of clothes and cane) given to patient.

## 2011-12-01 NOTE — Op Note (Signed)
NAME:  [name], [name]           ACCOUNT NO.:  [id number]  MEDICAL RECORD NO.:  [id number]  LOCATION:  [facility]                         FACILITY:  [facility]  PHYSICIAN:  Ollen Gross, M.D.    DATE OF BIRTH:  22-Jul-1946  DATE OF PROCEDURE:  12/01/2011 DATE OF DISCHARGE:                              OPERATIVE REPORT   PREOPERATIVE DIAGNOSIS:  Bearing surface failure, left total hip arthroplasty.  POSTOPERATIVE DIAGNOSIS:  Bearing surface failure, left total hip arthroplasty.  PROCEDURE:  Left hip revision, femoral stem and acetabular liner.  SURGEON:  Ollen Gross, M.D.  ASSISTANT:  Alexzandrew L. Perkins, P.A.C.  ANESTHESIA:  General.  ESTIMATED BLOOD LOSS:  300.  DRAINS:  Hemovac x1.  COMPLICATIONS:  None.  CONDITION:  Stable to Recovery.  BRIEF CLINICAL NOTE:  [name] is a 65 year old male, underwent uncomplicated left total hip arthroplasty about a year and a half ago. Approximately 4-6 months ago, he started complaining of increased lateral pain.  Workup unremarkable except for MARS MRI, which showed fluid collection in the joint.  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  He presents now for arthrotomy and possible revision based on intraop findings.  PROCEDURE IN DETAIL:  After successful administration of general anesthetic, the patient was placed in the right lateral decubitus position with the left side up, held with a hip positioner.  Left lower extremity was isolated from his perineum with plastic drapes and prepped and draped in usual sterile fashion.  Previous posterolateral incisions were utilized, skin cut with a 10 blade through subcutaneous tissue to the fascia lata, which was incised in line with the skin incision.  No fluid was encountered at this level.  The muscle and tissue at this level all appear normal.  I then sized the joint and a thin  milky fluid was encountered.  It did not have the consistency of purulence as it was way too thin for that.  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  The fluid was sent for stat Gram stain, C and S.  We waited approximately 45 minutes to 1 hour for the Gram stain results and results were rare white cells with no organisms.  I then dislocated the hip.  I removed the femoral head.  There was a significant amount of corrosion at the head-neck taper.  There was a black-stained corrosion both on the neck and in the inner aspect of the head.  There was no wear on the outer aspect of the head at the articular surface or at the articular surface of the acetabular liner. Given the amount of corrosion on the femoral neck, I decided to remove the stem as it was am S-ROM and would be removed without any bony destruction or disruption.  We then disrupted the interface between the stem and sleeve using the S-ROM chisel.  I then used the extractor to remove the stem.  The femur was then retracted anteriorly to gain exposure of the acetabulum.  There was a superficial layer of tissue that was necrosed, but when I debrided that  all the other tissue was healthy all around the socket.  All of the pericapsular tissues also had a healthy appearance post-debriding them.  I then removed the metal acetabular liner from the acetabular shell.  This was a 60-mm with a 40-mm in the inner diameter. Once again, there was no abrasion on the articular surface and no abrasion on the back side of the liner.  The acetabular shell was well- fixed.  I removed the dome screw and the apex hole eliminator, and the socket was solidly rown in.  I then placed an elevator through the 3 holes in the cup and then no evidence of any bone loss or lytic cysts behind that.  We then irrigated the wound with 6L of saline using pulsatile lavage.  Once we finally got the  Gram stain results back and it was negative, then we impacted the 36-mm neutral +4 marathon liner into the acetabular shell.  On the femoral side, we placed a new 20 x 15 stem with a 36 + 12 neck matching native anteversion.  The 36 + 6 trial head was placed.  Hips reduced with outstanding stability with full extension and full external rotation, 70 degrees flexion, 40 degrees adduction, 90 degrees internal rotation, 90 degrees of flexion, and 70 degrees of internal rotation.  By placing the left leg on top of the right, the leg lengths were equal.  The hips then dislocated and trial head removed.  Permanent 36 + 6 ceramic head was placed and the hips reduced with same stability parameters.  The wound was copiously irrigated with saline solution and the capsule and pseudocapsule reattached to the femurs through drill holes with Ethibond suture.  This was a very stable repair.  Wounds again irrigated further with saline and then the fascia lata closed over Hemovac drain with interrupted #1 Vicryl, subcu closed with #1 and 2-0 Vicryl and subcuticular running 4-0 Monocryl.  Incisions cleaned and dried and Steri-Strips and a bulky sterile dressing applied.  He was then placed into a knee immobilizer, awakened, and transported to recovery in stable condition.     Ollen Gross, M.D.     FA/MEDQ  D:  12/01/2011  T:  12/01/2011  Job:  [id number]

## 2011-12-01 NOTE — Anesthesia Preprocedure Evaluation (Addendum)
Anesthesia Evaluation  Patient identified by MRN, date of birth, ID band Patient awake    Reviewed: Allergy & Precautions, H&P , NPO status , Patient's Chart, lab work & pertinent test results  Airway Mallampati: II TM Distance: >3 FB Neck ROM: full    Dental No notable dental hx. (+) Dental Advisory Given,    Pulmonary neg pulmonary ROS, asthma ,  clear to auscultation  Pulmonary exam normal       Cardiovascular Exercise Tolerance: Good neg cardio ROS regular Normal    Neuro/Psych PSYCHIATRIC DISORDERS Panic disorder and anxietyNegative Neurological ROS  Negative Psych ROS   GI/Hepatic negative GI ROS, Neg liver ROS,   Endo/Other  Negative Endocrine ROS  Renal/GU negative Renal ROS  Genitourinary negative   Musculoskeletal   Abdominal   Peds  Hematology negative hematology ROS (+)   Anesthesia Other Findings   Reproductive/Obstetrics negative OB ROS                          Anesthesia Physical Anesthesia Plan  ASA: II  Anesthesia Plan: General   Post-op Pain Management:    Induction: Intravenous  Airway Management Planned: Oral ETT  Additional Equipment:   Intra-op Plan:   Post-operative Plan: Extubation in OR  Informed Consent: I have reviewed the patients History and Physical, chart, labs and discussed the procedure including the risks, benefits and alternatives for the proposed anesthesia with the patient or authorized representative who has indicated his/her understanding and acceptance.   Dental Advisory Given  Plan Discussed with: CRNA  Anesthesia Plan Comments:         Anesthesia Quick Evaluation

## 2011-12-01 NOTE — Brief Op Note (Addendum)
12/01/2011  4:39 PM  PATIENT:  [name]  65 y.o. male  PRE-OPERATIVE DIAGNOSIS:  left hip metal bearing failure  POST-OPERATIVE DIAGNOSIS:  left hip metal bearing failure  PROCEDURE:  Procedure(s): TOTAL HIP REVISION- Left- (femur and acetabular liner)  SURGEON:  Surgeon(s): Gus Rankin Viviene Thurston  PHYSICIAN ASSISTANT:   ASSISTANTS: [name], PA-C   ANESTHESIA:   general  EBL:  Total I/O In: 2000 [I.V.:2000] Out: 515 [Urine:215; Blood:300]  BLOOD ADMINISTERED:none  DRAINS: (Medium) Hemovact drain(s) in the left thigh with  Suction Open   LOCAL MEDICATIONS USED:  NONE  SPECIMEN:  No Specimen  DISPOSITION OF SPECIMEN:  N/A  COUNTS:  YES  TOURNIQUET:  * No tourniquets in log *  DICTATION: .Other Dictation: Dictation Number [id number]  PLAN OF CARE: Admit to inpatient   PATIENT DISPOSITION:  PACU - hemodynamically stable.   Delay start of Pharmacological VTE agent (>24hrs) due to surgical blood loss or risk of bleeding:  yes  Gus Rankin. Deiontae Rabel, MD    12/01/2011, 4:41 PM

## 2011-12-02 LAB — CBC
HCT: 33.5 % — ABNORMAL LOW (ref 39.0–52.0)
Hemoglobin: 11.7 g/dL — ABNORMAL LOW (ref 13.0–17.0)
MCH: 32.8 pg (ref 26.0–34.0)
MCHC: 34.9 g/dL (ref 30.0–36.0)
MCV: 93.8 fL (ref 78.0–100.0)
RDW: 12.1 % (ref 11.5–15.5)

## 2011-12-02 LAB — BASIC METABOLIC PANEL
BUN: 12 mg/dL (ref 6–23)
Creatinine, Ser: 0.96 mg/dL (ref 0.50–1.35)
GFR calc Af Amer: >90 mL/min (ref >90)
GFR calc non Af Amer: 86 mL/min — ABNORMAL LOW (ref >90)
Glucose, Bld: 170 mg/dL — ABNORMAL HIGH (ref 70–99)

## 2011-12-02 MED ORDER — POLYSACCHARIDE IRON 150 MG PO CAPS
150.0000 mg | ORAL_CAPSULE | Freq: Every day | ORAL | Status: DC
  Administered 2011-12-03: 150 mg via ORAL
  Filled 2011-12-02 (×2): qty 1

## 2011-12-02 NOTE — Progress Notes (Signed)
Subjective: 1 Day Post-Op Procedure(s) (LRB): TOTAL HIP REVISION (Left) Patient reports pain as mild.   Patient seen in rounds with Dr. Lequita Halt. Patient has complaints of soreness  But doing fairly well.  We will start therapy today. Plan is to go home after hospital stay likely tomorrow.  Objective: Vital signs in last 24 hours: Temp:  [97 F (36.1 C)-98.4 F (36.9 C)] 97.7 F (36.5 C) (12/11 0630) Pulse Rate:  [55-91] 55  (12/11 0630) Resp:  [12-18] 16  (12/11 0630) BP: (101-145)/(62-92) 110/62 mmHg (12/11 0630) SpO2:  [97 %-100 %] 100 % (12/11 0630) Weight:  [95.255 kg (210 lb)] 210 lb (95.255 kg) (12/10 1815)  Intake/Output from previous day:  Intake/Output Summary (Last 24 hours) at 12/02/11 0830 Last data filed at 12/02/11 0630  Gross per 24 hour  Intake   3260 ml  Output   1075 ml  Net   2185 ml    Intake/Output this shift:    Labs: Results for orders placed during the hospital encounter of 12/01/11  TYPE AND SCREEN      Component Value Range   ABO/RH(D) A POS     Antibody Screen NEG     Sample Expiration 12/04/2011    BODY FLUID CULTURE      Component Value Range   Specimen Description HIP LEFT     Special Requests NONE     Gram Stain       Value: RARE WBC PRESENT, PREDOMINANTLY MONONUCLEAR     NO SQUAMOUS EPITHELIAL CELLS SEEN     NO ORGANISMS SEEN     Gram Stain Report Called to,Read Back By and Verified With: Gram Stain Report Called to,Read Back By and Verified With: S DAVENPORT RN 12/01/11 16:09 A NAVARRO Performed at [facility]   Culture PENDING     Report Status PENDING    GRAM STAIN      Component Value Range   Specimen Description HIP LEFT     Special Requests NONE     Gram Stain       Value: RARE WBC SEEN MONONUCLEAR     NO ORGANISMS SEEN     Gram Stain Report Called to,Read Back By and Verified With: SMicael Hampshire RN 12/01/11 16:09 A NAVARRO   Report Status 12/01/2011 FINAL    ANAEROBIC CULTURE      Component Value Range   Specimen Description HIP LEFT     Special Requests NONE     Gram Stain       Value: NO WBC SEEN     NO SQUAMOUS EPITHELIAL CELLS SEEN     NO ORGANISMS SEEN   Culture PENDING     Report Status PENDING    CBC      Component Value Range   WBC 8.4  4.0 - 10.5 (K/uL)   RBC 3.57 (*) 4.22 - 5.81 (MIL/uL)   Hemoglobin 11.7 (*) 13.0 - 17.0 (g/dL)   HCT 16.1 (*) 09.6 - 52.0 (%)   MCV 93.8  78.0 - 100.0 (fL)   MCH 32.8  26.0 - 34.0 (pg)   MCHC 34.9  30.0 - 36.0 (g/dL)   RDW 04.5  40.9 - 81.1 (%)   Platelets 163  150 - 400 (K/uL)  BASIC METABOLIC PANEL      Component Value Range   Sodium 136  135 - 145 (mEq/L)   Potassium 4.4  3.5 - 5.1 (mEq/L)   Chloride 104  96 - 112 (mEq/L)   CO2 27  19 - 32 (mEq/L)   Glucose, Bld 170 (*) 70 - 99 (mg/dL)   BUN 12  6 - 23 (mg/dL)   Creatinine, Ser 1.61  0.50 - 1.35 (mg/dL)   Calcium 8.7  8.4 - 09.6 (mg/dL)   GFR calc non Af Amer 86 (*) >90 (mL/min)   GFR calc Af Amer >90  >90 (mL/min)    Exam - Neurovascular intact Sensation intact distally Dressing - clean, dry Motor function intact - moving foot and toes well on exam.  Hemovac pulled without difficulty.  Assessment/Plan: 1 Day Post-Op Procedure(s) (LRB): TOTAL HIP REVISION (Left)  Past Medical History  Diagnosis Date  . Panic disorder 2002  . Allergy   . Kidney stones     lithotripsy 01/2008 (Dr. Annabell Howells), cystoscopy and ureteroscopy 04/2020 (stone R [facility])  . Pure hypercholesterolemia   . Alcohol abuse quit 04/20/1992  . DDD (degenerative disc disease), lumbar   . Asthma     PT STATES MILD ASTHMA-INHALERS ONLY IF NEEDED  -  . Anxiety     Advance diet Up with therapy Plan for discharge tomorrow Discharge home with home health when met goals.  DVT Prophylaxis - Xarelto Protocol Partial-Weight Bearing 25-50% left Leg D/C Knee Immobilizer Hemovac Pulled Begin Therapy Hip Preacutions No vaccines.  [name] 12/02/2011, 8:30 AM

## 2011-12-02 NOTE — Progress Notes (Signed)
Physical Therapy Treatment Patient Details Name: [name] MRN: [id number] DOB: [birth year] Today's Date: 12/02/2011 1326 - 1345  GT PT Assessment/Plan  PT - Assessment/Plan PT Plan: Discharge plan remains appropriate PT Frequency: 7X/week Follow Up Recommendations: Home health PT Equipment Recommended: None recommended by PT PT Goals  Acute Rehab PT Goals Time For Goal Achievement: 7 days Pt will go Supine/Side to Sit: with modified independence PT Goal: Supine/Side to Sit - Progress: Progressing toward goal Pt will go Sit to Supine/Side: with modified independence PT Goal: Sit to Supine/Side - Progress: Progressing toward goal Pt will go Sit to Stand: with modified independence PT Goal: Sit to Stand - Progress: Progressing toward goal Pt will go Stand to Sit: with modified independence PT Goal: Stand to Sit - Progress: Progressing toward goal Pt will Ambulate: >150 feet;with supervision;with rolling walker PT Goal: Ambulate - Progress: Progressing toward goal  PT Treatment Precautions/Restrictions  Precautions Precautions: Posterior Hip Precaution Comments: sign hung in room Required Braces or Orthoses: No Restrictions Weight Bearing Restrictions: No LLE Weight Bearing: Weight bearing as tolerated Mobility (including Balance) Transfers Sit to Stand: 4: Min assist;5: Supervision;Without upper extremity assist;With armrests;From chair/3-in-1 Sit to Stand Details (indicate cue type and reason): cues for UE use and position of LEs Stand to Sit: 4: Min assist;5: Supervision;To chair/3-in-1;With upper extremity assist;With armrests Stand to Sit Details: cues for UE use and position of LEs Ambulation/Gait Ambulation/Gait Assistance: 5: Supervision;4: Min assist Ambulation/Gait Assistance Details (indicate cue type and reason): cues for ER on L and position from RW Ambulation Distance (Feet): 350 Feet Assistive device: Rolling walker Gait Pattern: Step-through pattern     Exercise    End of Session PT - End of Session Activity Tolerance: Patient tolerated treatment well Patient left: in chair;with call bell in reach General Behavior During Session: [facility] for tasks performed Cognition: [facility] for tasks performed  [name] 12/02/2011, 2:16 PM

## 2011-12-02 NOTE — Progress Notes (Signed)
Physical Therapy Evaluation Patient Details Name: Herschell Virani MRN: [id number] DOB: Dec 24, 1945 Today's Date: 12/02/2011 0935 - 1002; EVAL Problem List:  Patient Active Problem List  Diagnoses  . PANIC DISORDER  . ASTHMA  . DIVERTICULOSIS OF COLON  . TROCHANTERIC BURSITIS, LEFT  . COLONIC POLYPS, ADENOMATOUS, HX OF  . ALLERGIC RHINITIS  . HYPERLIPIDEMIA  . BENIGN PROSTATIC HYPERTROPHY, WITH OBSTRUCTION  . OSTEOARTHRITIS  . PROSTATITIS, HX OF  . Pure hypercholesterolemia    Past Medical History:  Past Medical History  Diagnosis Date  . Panic disorder 2002  . Allergy   . Kidney stones     lithotripsy 01/2008 (Dr. Annabell Howells), cystoscopy and ureteroscopy 04/2020 ([facility])  . Pure hypercholesterolemia   . Alcohol abuse quit 04/20/1992  . DDD (degenerative disc disease), lumbar   . Asthma     PT STATES MILD ASTHMA-INHALERS ONLY IF NEEDED  -  . Anxiety    Past Surgical History:  Past Surgical History  Procedure Date  . Appendectomy age 54  . Tonsillectomy and adenoidectomy age 29  . Left heel spur surgery 1989  . Arthroscopic knee surgery 1990,repeated in 1996    cartilage injury Left  . C spine sugery 1993  . Nasal polyp surgery 1993,repeat 2006  . Elbow surgery right, 1995, repeated 1996  . Laparoscopic cholecystectomy 1998  . Arthroscopic shoulder surgery left,2009,repeat 2010  . 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 Lequita Halt  . Kidney stone 03/2009, 04/2011    removal (cystoscopically) Dr. Annabell Howells    PT Assessment/Plan/Recommendation PT Assessment Clinical Impression Statement: Pt with L THR revision presents with decreased functional mobility and will benefit from skilled PT intervention to maximize IND for d/c home PT Recommendation/Assessment: Patient will need skilled PT in the acute care venue PT Problem List: Decreased mobility;Decreased knowledge of use of DME;Pain;Decreased knowledge of precautions;Decreased strength PT Therapy Diagnosis : Difficulty  walking PT Plan PT Frequency: 7X/week PT Treatment/Interventions: DME instruction;Functional mobility training;Gait training;Therapeutic activities;Therapeutic exercise;Patient/family education PT Recommendation Recommendations for Other Services: OT consult Follow Up Recommendations: Home health PT Equipment Recommended: None recommended by PT PT Goals  Acute Rehab PT Goals PT Goal Formulation: With patient Time For Goal Achievement: 7 days Pt will go Supine/Side to Sit: with modified independence PT Goal: Supine/Side to Sit - Progress: Not met Pt will go Sit to Supine/Side: with modified independence PT Goal: Sit to Supine/Side - Progress: Not met Pt will go Sit to Stand: with modified independence PT Goal: Sit to Stand - Progress: Not met Pt will go Stand to Sit: with modified independence PT Goal: Stand to Sit - Progress: Not met Pt will Ambulate: >150 feet;with supervision;with rolling walker PT Goal: Ambulate - Progress: Not met  PT Evaluation Precautions/Restrictions  Precautions Precautions: Posterior Hip Precaution Comments: sign hung in room Required Braces or Orthoses: No Restrictions Weight Bearing Restrictions: No LLE Weight Bearing: Weight bearing as tolerated Prior Functioning  Home Living Lives With: Alone Receives Help From: Friend(s);Neighbor Type of Home: House Home Layout: One level Home Access: Level entry Prior Function Level of Independence: Independent with basic ADLs;Independent with gait;Independent with transfers Able to Take Stairs?: Yes Cognition Cognition Arousal/Alertness: Awake/alert Overall Cognitive Status: Appears within functional limits for tasks assessed Orientation Level: Oriented X4 Sensation/Coordination Coordination Gross Motor Movements are Fluid and Coordinated: Yes Extremity Assessment RUE Assessment RUE Assessment: Within Functional Limits LUE Assessment LUE Assessment: Within Functional Limits RLE Assessment RLE  Assessment: Within Functional Limits LLE Assessment LLE Assessment: Within Functional  Limits (WFL following post THP) Mobility (including Balance) Bed Mobility Bed Mobility: Yes Supine to Sit: 4: Min assist Supine to Sit Details (indicate cue type and reason): cues for THP Transfers Transfers: Yes Sit to Stand: 4: Min assist;With upper extremity assist;From bed Sit to Stand Details (indicate cue type and reason): cues for UE use and position of LEs Stand to Sit: 4: Min assist;With upper extremity assist;With armrests;To chair/3-in-1 Stand to Sit Details: cues for UE use and position of LEs Ambulation/Gait Ambulation/Gait: Yes Ambulation/Gait Assistance: 4: Min assist;5: Supervision Ambulation/Gait Assistance Details (indicate cue type and reason): cues for ER on L and position from RW Ambulation Distance (Feet): 200 Feet Assistive device: Rolling walker Gait Pattern: Step-to pattern;Step-through pattern    Exercise  Total Joint Exercises Ankle Circles/Pumps: AROM;Both;15 reps;Supine Heel Slides: AAROM;10 reps;Supine;Left;AROM Hip ABduction/ADduction: AAROM;10 reps;Supine;Left;AROM End of Session PT - End of Session Equipment Utilized During Treatment: Gait belt Activity Tolerance: Patient tolerated treatment well Patient left: in chair;with call bell in reach Nurse Communication: Mobility status for transfers;Mobility status for ambulation General Behavior During Session: [facility] for tasks performed Cognition: [facility] for tasks performed  Elvy Mclarty 12/02/2011, 12:10 PM

## 2011-12-02 NOTE — Progress Notes (Signed)
Utilization review completed.  

## 2011-12-03 ENCOUNTER — Encounter (HOSPITAL_COMMUNITY): Payer: Self-pay | Admitting: Orthopedic Surgery

## 2011-12-03 LAB — CBC
MCH: 33.1 pg (ref 26.0–34.0)
MCHC: 34.3 g/dL (ref 30.0–36.0)
RDW: 12.8 % (ref 11.5–15.5)

## 2011-12-03 LAB — BASIC METABOLIC PANEL
BUN: 13 mg/dL (ref 6–23)
Calcium: 9.1 mg/dL (ref 8.4–10.5)
GFR calc Af Amer: >90 mL/min (ref >90)
GFR calc non Af Amer: 86 mL/min — ABNORMAL LOW (ref >90)
Glucose, Bld: 128 mg/dL — ABNORMAL HIGH (ref 70–99)
Potassium: 4.8 mEq/L (ref 3.5–5.1)
Sodium: 141 mEq/L (ref 135–145)

## 2011-12-03 MED ORDER — METHOCARBAMOL 500 MG PO TABS: 500.0000 mg | ORAL_TABLET | Freq: Four times a day (QID) | ORAL | Status: AC | PRN

## 2011-12-03 MED ORDER — POLYSACCHARIDE IRON 150 MG PO CAPS: 150.0000 mg | ORAL_CAPSULE | Freq: Every day | ORAL | Status: DC

## 2011-12-03 MED ORDER — OXYCODONE HCL 5 MG PO TABS: 5.0000 mg | ORAL_TABLET | ORAL | Status: AC | PRN

## 2011-12-03 MED ORDER — RIVAROXABAN 10 MG PO TABS: 10.0000 mg | ORAL_TABLET | Freq: Every day | ORAL | Status: DC

## 2011-12-03 NOTE — Progress Notes (Signed)
12/03/2011 Raynelle Bring BSN CCM [phone] CM spoke with patient. Plans are for patient to return to his home where he will have support of neighbors, friends and church family. States he already has elevated toilet seat, cane and rw. Wants to use [place] for [facility] services. Plan are for d/c today with start of HHpt tomorrow

## 2011-12-03 NOTE — Progress Notes (Signed)
Subjective: 2 Days Post-Op Procedure(s) (LRB): TOTAL HIP REVISION (Left) Patient reports pain as mild.   Patient seen in rounds with Dr. Lequita Halt. Patient has done well and ready to go home.  Objective: Vital signs in last 24 hours: Temp:  [97.8 F (36.6 C)-98.6 F (37 C)] 97.8 F (36.6 C) (12/12 0620) Pulse Rate:  [57-97] 65  (12/12 0620) Resp:  [16] 16  (12/12 0620) BP: (100-144)/(48-96) 144/96 mmHg (12/12 0620) SpO2:  [96 %-99 %] 96 % (12/12 0620)  Intake/Output from previous day:  Intake/Output Summary (Last 24 hours) at 12/03/11 0727 Last data filed at 12/03/11 0630  Gross per 24 hour  Intake   4296 ml  Output   1750 ml  Net   2546 ml    Intake/Output this shift:    Labs: Results for orders placed during the hospital encounter of 12/01/11  TYPE AND SCREEN      Component Value Range   ABO/RH(D) A POS     Antibody Screen NEG     Sample Expiration 12/04/2011    BODY FLUID CULTURE      Component Value Range   Specimen Description HIP LEFT     Special Requests NONE     Gram Stain       Value: RARE WBC PRESENT, PREDOMINANTLY MONONUCLEAR     NO SQUAMOUS EPITHELIAL CELLS SEEN     NO ORGANISMS SEEN     Gram Stain Report Called to,Read Back By and Verified With: Gram Stain Report Called to,Read Back By and Verified With: S DAVENPORT RN 12/01/11 16:09 A NAVARRO Performed at [facility]   Culture NO GROWTH     Report Status PENDING    ANAEROBIC CULTURE      Component Value Range   Specimen Description HIP LEFT     Special Requests NONE     Gram Stain PENDING     Culture       Value: NO ANAEROBES ISOLATED; CULTURE IN PROGRESS FOR 5 DAYS   Report Status PENDING    GRAM STAIN      Component Value Range   Specimen Description HIP LEFT     Special Requests NONE     Gram Stain       Value: RARE WBC SEEN MONONUCLEAR     NO ORGANISMS SEEN     Gram Stain Report Called to,Read Back By and Verified With: SMicael Hampshire RN 12/01/11 16:09 A NAVARRO   Report Status  12/01/2011 FINAL    TISSUE CULTURE      Component Value Range   Specimen Description HIP LEFT     Special Requests NONE     Gram Stain PENDING     Culture NO GROWTH     Report Status PENDING    ANAEROBIC CULTURE      Component Value Range   Specimen Description HIP LEFT     Special Requests NONE     Gram Stain       Value: NO WBC SEEN     NO SQUAMOUS EPITHELIAL CELLS SEEN     NO ORGANISMS SEEN   Culture       Value: NO ANAEROBES ISOLATED; CULTURE IN PROGRESS FOR 5 DAYS   Report Status PENDING    CBC      Component Value Range   WBC 8.4  4.0 - 10.5 (K/uL)   RBC 3.57 (*) 4.22 - 5.81 (MIL/uL)   Hemoglobin 11.7 (*) 13.0 - 17.0 (g/dL)   HCT 40.9 (*) 81.1 -  52.0 (%)   MCV 93.8  78.0 - 100.0 (fL)   MCH 32.8  26.0 - 34.0 (pg)   MCHC 34.9  30.0 - 36.0 (g/dL)   RDW 21.3  08.6 - 57.8 (%)   Platelets 163  150 - 400 (K/uL)  BASIC METABOLIC PANEL      Component Value Range   Sodium 136  135 - 145 (mEq/L)   Potassium 4.4  3.5 - 5.1 (mEq/L)   Chloride 104  96 - 112 (mEq/L)   CO2 27  19 - 32 (mEq/L)   Glucose, Bld 170 (*) 70 - 99 (mg/dL)   BUN 12  6 - 23 (mg/dL)   Creatinine, Ser 4.69  0.50 - 1.35 (mg/dL)   Calcium 8.7  8.4 - 62.9 (mg/dL)   GFR calc non Af Amer 86 (*) >90 (mL/min)   GFR calc Af Amer >90  >90 (mL/min)  CBC      Component Value Range   WBC 6.9  4.0 - 10.5 (K/uL)   RBC 3.57 (*) 4.22 - 5.81 (MIL/uL)   Hemoglobin 11.8 (*) 13.0 - 17.0 (g/dL)   HCT 52.8 (*) 41.3 - 52.0 (%)   MCV 96.4  78.0 - 100.0 (fL)   MCH 33.1  26.0 - 34.0 (pg)   MCHC 34.3  30.0 - 36.0 (g/dL)   RDW 24.4  01.0 - 27.2 (%)   Platelets 146 (*) 150 - 400 (K/uL)  BASIC METABOLIC PANEL      Component Value Range   Sodium 141  135 - 145 (mEq/L)   Potassium 4.8  3.5 - 5.1 (mEq/L)   Chloride 107  96 - 112 (mEq/L)   CO2 30  19 - 32 (mEq/L)   Glucose, Bld 128 (*) 70 - 99 (mg/dL)   BUN 13  6 - 23 (mg/dL)   Creatinine, Ser 5.36  0.50 - 1.35 (mg/dL)   Calcium 9.1  8.4 - 64.4 (mg/dL)   GFR calc non Af Amer  86 (*) >90 (mL/min)   GFR calc Af Amer >90  >90 (mL/min)    Exam: Neurovascular intact Sensation intact distally clean, dry, no drainage Motor function intact - moving foot and toes well on exam.   Assessment/Plan: 2 Days Post-Op Procedure(s) (LRB): TOTAL HIP REVISION (Left) Advance diet Up with therapy Discharge home with home health Procedure(s) (LRB): TOTAL HIP REVISION (Left) Past Medical History  Diagnosis Date  . Panic disorder 2002  . Allergy   . Kidney stones     lithotripsy 01/2008 (Dr. Annabell Howells), cystoscopy and ureteroscopy 04/2020 ([facility])  . Pure hypercholesterolemia   . Alcohol abuse quit 04/20/1992  . DDD (degenerative disc disease), lumbar   . Asthma     PT STATES MILD ASTHMA-INHALERS ONLY IF NEEDED  -  . Anxiety     Diet - regular F/U - in 2 weeks PWB COD - Good Meds - Oxy IR/Robaxin/Xarelto    [name] 12/03/2011, 7:27 AM

## 2011-12-03 NOTE — Discharge Summary (Signed)
Physician Discharge Summary   Patient ID: [name] MRN: [id number] DOB/AGE: 1946-09-01 65 y.o.  Admit date: 12/01/2011 Discharge date: 12/03/2011  Primary Diagnosis: Failed Left Total Hip  Admission Diagnoses: Past Medical History  Diagnosis Date  . Panic disorder 2002  . Allergy   . Kidney stones     lithotripsy 01/2008 (Dr. Annabell Howells), cystoscopy and ureteroscopy 04/2020 (stone R [facility])  . Pure hypercholesterolemia   . Alcohol abuse quit 04/20/1992  . DDD (degenerative disc disease), lumbar   . Asthma     PT STATES MILD ASTHMA-INHALERS ONLY IF NEEDED  -  . Anxiety     Discharge Diagnoses:  Active Problems:  * No active hospital problems. *    Procedure: Procedure(s) (LRB): TOTAL HIP REVISION (Left)   Consults: none  HPI:  [name] is a 65 year old male, underwent uncomplicated left total hip arthroplasty about a year and a half ago. Approximately 4-6 months ago, he started complaining of increased lateral pain. Workup unremarkable except for MARS MRI, which showed fluid collection in the joint. 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 He presents now for arthrotomy and possible revision based on intraop findings.   Laboratory Data: Hospital Outpatient Visit on 11/27/2011  Component Date Value Range Status  . aPTT (seconds) 11/27/2011 30  24-37 Final  . WBC (K/uL) 11/27/2011 5.6  4.0-10.5 Final  . RBC (MIL/uL) 11/27/2011 4.43  4.22-5.81 Final  . Hemoglobin (g/dL) 57/84/6962 95.2  84.1-32.4 Final  . HCT (%) 11/27/2011 42.0  39.0-52.0 Final  . MCV (fL) 11/27/2011 94.8  78.0-100.0 Final  . MCH (pg) 11/27/2011 33.2  26.0-34.0 Final  . MCHC (g/dL) 40/09/2724 36.6  44.0-34.7 Final  . RDW (%) 11/27/2011 12.5  11.5-15.5 Final  . Platelets (K/uL) 11/27/2011 191  150-400 Final  . Sodium (mEq/L) 11/27/2011 138  135-145 Final  . Potassium (mEq/L) 11/27/2011 4.0   3.5-5.1 Final  . Chloride (mEq/L) 11/27/2011 103  96-112 Final  . CO2 (mEq/L) 11/27/2011 29  19-32 Final  . Glucose, Bld (mg/dL) 42/59/5638 98  75-64 Final  . BUN (mg/dL) 33/29/5188 16  4-16 Final  . Creatinine, Ser (mg/dL) 60/63/0160 1.09  3.23-5.57 Final  . Calcium (mg/dL) 32/20/2542 9.2  7.0-62.3 Final  . Total Protein (g/dL) 76/28/3151 6.7  7.6-1.6 Final  . Albumin (g/dL) 07/37/1062 3.9  6.9-4.8 Final  . AST (U/L) 11/27/2011 17  0-37 Final  . ALT (U/L) 11/27/2011 15  0-53 Final  . Alkaline Phosphatase (U/L) 11/27/2011 59  39-117 Final  . Total Bilirubin (mg/dL) 54/62/7035 0.5  0.0-9.3 Final  . GFR calc non Af Amer (mL/min) 11/27/2011 78* >90 Final  . GFR calc Af Amer (mL/min) 11/27/2011 >90  >90 Final   Comment:                                 The eGFR has been calculated                          using the CKD EPI equation.                          This calculation has not been  validated in all clinical                          situations.                          eGFR's persistently                          <90 mL/min signify                          possible Chronic Kidney Disease.  Marland Kitchen Prothrombin Time (seconds) 11/27/2011 14.2  11.6-15.2 Final  . INR  11/27/2011 1.08  0.00-1.49 Final  . Color, Urine  11/27/2011 YELLOW  YELLOW Final  . APPearance  11/27/2011 CLEAR  CLEAR Final  . Specific Gravity, Urine  11/27/2011 1.024  1.005-1.030 Final  . pH  11/27/2011 6.0  5.0-8.0 Final  . Glucose, UA (mg/dL) 95/62/1308 NEGATIVE  NEGATIVE Final  . Hgb urine dipstick  11/27/2011 NEGATIVE  NEGATIVE Final  . Bilirubin Urine  11/27/2011 NEGATIVE  NEGATIVE Final  . Ketones, ur (mg/dL) 65/78/4696 NEGATIVE  NEGATIVE Final  . Protein, ur (mg/dL) 29/52/8413 NEGATIVE  NEGATIVE Final  . Urobilinogen, UA (mg/dL) 24/40/1027 1.0  2.5-3.6 Final  . Nitrite  11/27/2011 NEGATIVE  NEGATIVE Final  . Leukocytes, UA  11/27/2011 NEGATIVE  NEGATIVE Final   MICROSCOPIC NOT DONE ON  URINES WITH NEGATIVE PROTEIN, BLOOD, LEUKOCYTES, NITRITE, OR GLUCOSE <1000 mg/dL.  Marland Kitchen MRSA, PCR  11/27/2011 NEGATIVE  NEGATIVE Final  . Staphylococcus aureus  11/27/2011 NEGATIVE  NEGATIVE Final   Comment:                                 The Xpert SA Assay (FDA                          approved for NASAL specimens                          only), is one component of                          a comprehensive surveillance                          program.  It is not intended                          to diagnose infection nor to                          guide or monitor treatment.    Basename 12/03/11 0401 12/02/11 0400  HGB 11.8* 11.7*    Basename 12/03/11 0401 12/02/11 0400  WBC 6.9 8.4  RBC 3.57* 3.57*  HCT 34.4* 33.5*  PLT 146* 163    Basename 12/03/11 0401 12/02/11 0400  NA 141 136  K 4.8 4.4  CL 107 104  CO2 30 27  BUN 13 12  CREATININE 0.98 0.96  GLUCOSE 128* 170*  CALCIUM 9.1 8.7   No results found for this basename: LABPT:2,INR:2 in the last 72 hours  X-Rays:Dg Hip  Complete Left  11/27/2011  *RADIOLOGY REPORT*  Clinical Data: Left hip revision.  Preop  LEFT HIP - COMPLETE 2+ VIEW  Comparison: 04/26/2010  Findings: Left hip replacement in satisfactory position and alignment.  Acetabular component is unchanged.  No hardware complication or loosening is identified.  Negative for fracture.  Degenerative change and spurring of the right hip is present.  IMPRESSION: Satisfactory radiographic appearance of left hip replacement.  No acute abnormality.  Original Report Authenticated By: Camelia Phenes, M.D.   Dg Pelvis Portable  12/01/2011  *RADIOLOGY REPORT*  Clinical Data: Postop left hip  PORTABLE PELVIS  Comparison: 04/26/2010  Findings: Revision of left hip prosthesis with a new acetabular prosthesis.  Femoral prosthesis appears similar.  Normal alignment.  No fracture or immediate complication.  IMPRESSION: Satisfactory left hip replacement.  Original Report Authenticated By:  Camelia Phenes, M.D.    EKG: Orders placed during the hospital encounter of 05/15/11  . EKG     Hospital Course: Patient was admitted to [facility] and taken to the OR and underwent the above state procedure without complications.  Patient tolerated the procedure well and was later transferred to the recovery room and then to the orthopaedic floor for postoperative care.  They were given PO and IV analgesics for pain control following their surgery.  They were given 24 hours of postoperative antibiotics and started on DVT prophylaxis.   PT and OT were ordered for total joint protocol.  Discharge planning consulted to help with postop disposition and equipment needs.  Patient had a decent night on the evening of surgery and started to get up with therapy on day one and ambulated 200 Feet. Hemovac drain was pulled without difficulty.  Continued to progress with therapy into day two and walked 400 Feet.  Dressing was changed on day two and the incision was healing well. Patient was seen in rounds and was ready to go home later that morning.  Discharge Medications: Prior to Admission medications   Medication Sig Start Date End Date Taking? Authorizing Provider  ALPRAZolam Prudy Feeler) 0.5 MG tablet Take 0.5 mg by mouth 3 (three) times daily as needed. ANXIETY--PT USUSALLY WOULD ONLY TAKE AT BEDTIME BECAUSE HE CAN'T SLEEP    Yes Historical Provider, MD  fluticasone (FLOVENT HFA) 110 MCG/ACT inhaler Inhale 1 puff into the lungs 2 (two) times daily as needed. WHEEZING  10/09/11  Yes Lavonda Jumbo, MD  levocetirizine (XYZAL) 5 MG tablet Take 5 mg by mouth every morning.    Yes Historical Provider, MD  PARoxetine (PAXIL) 40 MG tablet Take 1 tablet (40 mg total) by mouth every morning. 06/30/11  Yes Lavonda Jumbo, MD  Albuterol Sulfate (VENTOLIN HFA IN) Inhale 2 puffs into the lungs every 4 (four) hours as needed. WHEEZING     Historical Provider, MD  methocarbamol (ROBAXIN) 500 MG tablet Take 1 tablet  (500 mg total) by mouth every 6 (six) hours as needed. 12/03/11 12/13/11  Maryn Freelove, PA  oxyCODONE (OXY IR/ROXICODONE) 5 MG immediate release tablet Take 1-2 tablets (5-10 mg total) by mouth every 4 (four) hours as needed for pain. 12/03/11 12/13/11  Cornella Emmer, PA  polysaccharide iron (NIFEREX) 150 MG CAPS capsule Take 1 capsule (150 mg total) by mouth daily. 12/03/11   Trayveon Beckford Julien Girt, PA  rivaroxaban (XARELTO) 10 MG TABS tablet Take 1 tablet (10 mg total) by mouth daily with breakfast. 12/03/11   Stephaie Dardis Julien Girt, PA    Diet: regular  Activity:WBAT No bending hip  over 90 degrees- A "L" Angle Do not cross legs Do not let foot roll inward  When turning these patients a pillow should be placed between the patient's legs to prevent crossing.  Patients should have the affected knee fully extended when trying to sit or stand from all surfaces to prevent excessive hip flexion.  When ambulating and turning toward the affected side the affected leg should have the toes turned out prior to moving the walker and the rest of patient's body as to prevent internal rotation/ turning in of the leg.  Abduction pillows are the most effective way to prevent a patient from not crossing legs or turning toes in at rest. If an abduction pillow is not ordered placing a regular pillow length wise between the patient's legs is also an effective reminder.  It is imperative that these precautions be maintained so that the surgical hip does not dislocate.    Follow-up:in 2 weeks  Disposition: Home  Discharged Condition: good   Discharge Orders    Future Appointments: Provider: Department: Dept Phone: Center:   02/19/2012 10:00 AM [facility] [phone] [facility]     Future Orders Please Complete By Expires   Diet - low sodium heart healthy      Call MD / Call 911      Comments:   If you experience chest pain or shortness of breath, CALL 911 and be  transported to the hospital emergency room.  If you develope a fever above 101 F, pus (white drainage) or increased drainage or redness at the wound, or calf pain, call your surgeon's office.   Constipation Prevention      Comments:   Drink plenty of fluids.  Prune juice may be helpful.  You may use a stool softener, such as Colace (over the counter) 100 mg twice a day.  Use MiraLax (over the counter) for constipation as needed.   Increase activity slowly as tolerated      Weight Bearing as taught in Physical Therapy      Comments:   Use a walker or crutches as instructed.   Discharge instructions      Comments:   Pick up stool softner and laxative for home. Do not submerge incision under water. May shower. Continue to use ice for pain and swelling from surgery. Hip precautions.  Total Hip Protocol.   Driving restrictions      Comments:   No driving   Lifting restrictions      Comments:   No lifting   Follow the hip precautions as taught in Physical Therapy      Change dressing      Comments:   You may change your dressing  daily with sterile 4 x 4 inch gauze dressing and paper tape.  You may clean the incision with alcohol prior to redressing   TED hose      Comments:   Use stockings (TED hose) for 3 weeks on both leg(s).  You may remove them at night for sleeping.     Discharge Medication List as of 12/03/2011  9:47 AM    START taking these medications   Details  methocarbamol (ROBAXIN) 500 MG tablet Take 1 tablet (500 mg total) by mouth every 6 (six) hours as needed., Starting 12/03/2011, Until Sat 12/13/11, Print    oxyCODONE (OXY IR/ROXICODONE) 5 MG immediate release tablet Take 1-2 tablets (5-10 mg total) by mouth every 4 (four) hours as needed for pain., Starting 12/03/2011, Until Sat  12/13/11, Print    polysaccharide iron (NIFEREX) 150 MG CAPS capsule Take 1 capsule (150 mg total) by mouth daily., Starting 12/03/2011, Until Discontinued, Print    rivaroxaban (XARELTO)  10 MG TABS tablet Take 1 tablet (10 mg total) by mouth daily with breakfast., Starting 12/03/2011, Until Discontinued, Print      CONTINUE these medications which have NOT CHANGED   Details  ALPRAZolam (XANAX) 0.5 MG tablet Take 0.5 mg by mouth 3 (three) times daily as needed. ANXIETY--PT USUSALLY WOULD ONLY TAKE AT BEDTIME BECAUSE HE CAN'T SLEEP , Until Discontinued, Historical Med    fluticasone (FLOVENT HFA) 110 MCG/ACT inhaler Inhale 1 puff into the lungs 2 (two) times daily as needed. WHEEZING , Starting 10/09/2011, Until Discontinued, Historical Med    levocetirizine (XYZAL) 5 MG tablet Take 5 mg by mouth every morning. , Until Discontinued, Historical Med    PARoxetine (PAXIL) 40 MG tablet Take 1 tablet (40 mg total) by mouth every morning., Starting 06/30/2011, Until Discontinued, Normal    Albuterol Sulfate (VENTOLIN HFA IN) Inhale 2 puffs into the lungs every 4 (four) hours as needed. WHEEZING , Until Discontinued, Historical Med      STOP taking these medications     Multiple Vitamins-Minerals (CENTRUM SILVER PO)        Follow-up Information    Follow up with ALUISIO,FRANK V. Make an appointment in 2 weeks.   Contact information:   [facility] [address] [place] [address] [phone]          Signed: Patrica Duel 12/03/2011, 10:13 PM

## 2011-12-03 NOTE — Progress Notes (Signed)
Occupational Therapy Evaluation Patient Details Name: [name] MRN: [id number] DOB: 02-Nov-1946 Today's Date: 12/03/2011 EV1   [id number] Problem List:  Patient Active Problem List  Diagnoses  . PANIC DISORDER  . ASTHMA  . DIVERTICULOSIS OF COLON  . TROCHANTERIC BURSITIS, LEFT  . COLONIC POLYPS, ADENOMATOUS, HX OF  . ALLERGIC RHINITIS  . HYPERLIPIDEMIA  . BENIGN PROSTATIC HYPERTROPHY, WITH OBSTRUCTION  . OSTEOARTHRITIS  . PROSTATITIS, HX OF  . Pure hypercholesterolemia    Past Medical History:  Past Medical History  Diagnosis Date  . Panic disorder 2002  . Allergy   . Kidney stones     lithotripsy 01/2008 (Dr. Annabell Howells), cystoscopy and ureteroscopy 04/2020 ([facility])  . Pure hypercholesterolemia   . Alcohol abuse quit 04/20/1992  . DDD (degenerative disc disease), lumbar   . Asthma     PT STATES MILD ASTHMA-INHALERS ONLY IF NEEDED  -  . Anxiety    Past Surgical History:  Past Surgical History  Procedure Date  . Appendectomy age 48  . Tonsillectomy and adenoidectomy age 38  . Left heel spur surgery 1989  . Arthroscopic knee surgery 1990,repeated in 1996    cartilage injury Left  . C spine sugery 1993  . Nasal polyp surgery 1993,repeat 2006  . Elbow surgery right, 1995, repeated 1996  . Laparoscopic cholecystectomy 1998  . Arthroscopic shoulder surgery left,2009,repeat 2010  . 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 Lequita Halt  . Kidney stone 03/2009, 04/2011    removal (cystoscopically) Dr. Annabell Howells    OT Assessment/Plan/Recommendation OT Assessment Clinical Impression Statement: Pt s/p L THR revision who is very familiar with all adl routines and is not in need of acute OT at this time.  D/C acute OT. OT Recommendation/Assessment: Patient does not need any further OT services OT Recommendation Equipment Recommended: None recommended by OT OT Goals    OT Evaluation Precautions/Restrictions  Precautions Precautions: Posterior Hip Precaution Comments: sign  hung in room Required Braces or Orthoses: No Restrictions Weight Bearing Restrictions: No LLE Weight Bearing: Weight bearing as tolerated Prior Functioning Home Living Lives With: Alone Receives Help From: Friend(s);Neighbor Type of Home: House Home Layout: One level Home Access: Level entry Bathroom Shower/Tub: Walk-in shower;Door [employer] Toilet: Standard Bathroom Accessibility: Yes How Accessible: Accessible via walker Home Adaptive Equipment: Bedside commode/3-in-1;Reacher;Sock aid;Long-handled shoehorn;Long-handled sponge;Walker - rolling Prior Function Level of Independence: Independent with basic ADLs;Independent with gait;Independent with transfers Able to Take Stairs?: Yes Driving: Yes ADL ADL Eating/Feeding: Simulated;Independent Where Assessed - Eating/Feeding: Chair Grooming: Performed;Independent Where Assessed - Grooming: Standing at sink Upper Body Bathing: Simulated;Set up Where Assessed - Upper Body Bathing: Sitting, chair Lower Body Bathing: Simulated;Minimal assistance Lower Body Bathing Details (indicate cue type and reason): has long sponge at home to reach below knees. Where Assessed - Lower Body Bathing: Sit to stand from chair Upper Body Dressing: Performed;Independent Where Assessed - Upper Body Dressing: Sitting, chair Lower Body Dressing: Modified independent;Set up Lower Body Dressing Details (indicate cue type and reason): used reacher and sock aid. Where Assessed - Lower Body Dressing: Sit to stand from chair Toilet Transfer: Supervision/safety;Performed Toilet Transfer Method: Proofreader: Raised toilet seat with arms (or 3-in-1 over toilet) Toileting - Clothing Manipulation: Performed;Modified independent Where Assessed - Toileting Clothing Manipulation: Standing Toileting - Hygiene: Performed;Independent Where Assessed - Toileting Hygiene: Sit on 3-in-1 or toilet Tub/Shower Transfer: Not assessed;Other (comment)  (reviewed technique.  Pt familiar from old THR) Tub/Shower Transfer Method: Science writer: Walk in  shower Equipment Used: Reacher;Rolling walker;Sock aid ADL Comments: Pt very independent with basic adls at this point.  Pt familiar with all techniques. Vision/Perception    Cognition   Sensation/Coordination   Extremity Assessment RUE Assessment RUE Assessment: Within Functional Limits LUE Assessment LUE Assessment: Within Functional Limits Mobility  Transfers Transfers: Yes Sit to Stand: 5: Supervision;From chair/3-in-1;Without upper extremity assist;With armrests Stand to Sit: 5: Supervision;With armrests;To chair/3-in-1 Exercises   End of Session OT - End of Session Activity Tolerance: Patient tolerated treatment well Patient left: in chair;with call bell in reach Nurse Communication: Mobility status for transfers General Behavior During Session: [facility] for tasks performed Cognition: [facility] for tasks performed   Hope Budds [phone] 12/03/2011, 9:39 AM

## 2011-12-03 NOTE — Progress Notes (Signed)
Physical Therapy Treatment Patient Details Name: [name] MRN: [id number] DOB: 1946/11/18 Today's Date: 12/03/2011 4098 - 1191 GT, TE PT Assessment/Plan  PT - Assessment/Plan Comments on Treatment Session: Reviewed car transfers and stairs - pt expresses understanding PT Plan: Discharge plan remains appropriate PT Frequency: 7X/week Follow Up Recommendations: Home health PT Equipment Recommended: None recommended by OT PT Goals  Acute Rehab PT Goals Time For Goal Achievement: 7 days Pt will go Supine/Side to Sit: with modified independence PT Goal: Supine/Side to Sit - Progress: Progressing toward goal Pt will go Sit to Supine/Side: with modified independence PT Goal: Sit to Supine/Side - Progress: Progressing toward goal Pt will go Sit to Stand: with modified independence PT Goal: Sit to Stand - Progress: Progressing toward goal Pt will go Stand to Sit: with modified independence PT Goal: Stand to Sit - Progress: Progressing toward goal Pt will Ambulate: >150 feet;with supervision;with rolling walker PT Goal: Ambulate - Progress: Progressing toward goal  PT Treatment Precautions/Restrictions  Precautions Precautions: Posterior Hip Precaution Comments: sign hung in room Required Braces or Orthoses: No Restrictions Weight Bearing Restrictions: No LLE Weight Bearing: Weight bearing as tolerated Mobility (including Balance) Bed Mobility Supine to Sit: 5: Supervision Transfers Sit to Stand: 5: Supervision;From chair/3-in-1;Without upper extremity assist;With armrests Sit to Stand Details (indicate cue type and reason): cues for LE position Stand to Sit: 5: Supervision;With armrests;To chair/3-in-1 Stand to Sit Details: cues for LE position Ambulation/Gait Ambulation/Gait Assistance: 5: Supervision Ambulation/Gait Assistance Details (indicate cue type and reason): cues for ER on L Ambulation Distance (Feet): 400 Feet Assistive device: Rolling walker Gait  Pattern: Step-through pattern    Exercise  Total Joint Exercises Ankle Circles/Pumps: AROM;20 reps;Supine;Both Quad Sets: AROM;20 reps;Supine;Both Gluteal Sets: AROM;Both;10 reps;Supine Heel Slides: 20 reps;AAROM;Left;Supine Hip ABduction/ADduction: AAROM;Left;20 reps;Supine End of Session PT - End of Session Activity Tolerance: Patient tolerated treatment well Patient left: in chair;with call bell in reach General Behavior During Session: [facility] for tasks performed Cognition: [facility] for tasks performed  [name] 12/03/2011, 12:05 PM

## 2011-12-05 LAB — TISSUE CULTURE
Culture: NO GROWTH
Gram Stain: NONE SEEN

## 2011-12-05 LAB — BODY FLUID CULTURE: Culture: NO GROWTH

## 2011-12-06 LAB — ANAEROBIC CULTURE

## 2012-01-01 ENCOUNTER — Encounter: Payer: Medicare Other | Admitting: Family Medicine

## 2012-01-30 ENCOUNTER — Encounter: Payer: Self-pay | Admitting: Medical

## 2012-01-30 ENCOUNTER — Ambulatory Visit (INDEPENDENT_AMBULATORY_CARE_PROVIDER_SITE_OTHER): Payer: Medicare Other | Admitting: Medical

## 2012-01-30 VITALS — BP 122/80 | HR 62 | Temp 97.5°F | Resp 16 | Wt 214.0 lb

## 2012-01-30 DIAGNOSIS — J329 Chronic sinusitis, unspecified: Secondary | ICD-10-CM

## 2012-01-30 DIAGNOSIS — R52 Pain, unspecified: Secondary | ICD-10-CM | POA: Insufficient documentation

## 2012-01-30 LAB — POCT INFLUENZA A/B
Influenza A, POC: NEGATIVE
Influenza B, POC: NEGATIVE

## 2012-01-30 NOTE — Progress Notes (Signed)
Subjective:  [name] is a 66 y.o. male who presents for recheck.  He went to Urgent Care 2 days ago for 8 day hx/o nasal drainage, pain over the eyes, sinus pressure, ears feeling clogged up, diagnosed with sinus infection and put on Augmentin.  He has also been using Afrin BID for the last several days.  Yesterday though he began getting chills, body aches, frontal headache, and subjective fever.  He was concerned that he may have some other infection besides sinus infection.  Denies sick contacts.  No other aggravating or relieving factors.  No other c/o.  Past Medical History  Diagnosis Date  . Panic disorder 2002  . Allergy   . Kidney stones     lithotripsy 01/2008 (Dr. Annabell Howells), cystoscopy and ureteroscopy 04/2020 ([facility])  . Pure hypercholesterolemia   . Alcohol abuse quit 04/20/1992  . DDD (degenerative disc disease), lumbar   . Asthma     PT STATES MILD ASTHMA-INHALERS ONLY IF NEEDED  -  . Anxiety    ROS: Gen: +subj fever, chills Skin: no rash HEENT: +congestion, ear fullness, sinus pressure, but no ST GI: no abdominal pain, NVD GU: negative   Objective:   Filed Vitals:   01/30/12 1456  BP: 122/80  Pulse: 62  Temp: 97.5 F (36.4 C)  Resp: 16    General appearance: Alert, WD/WN, no distress                             Skin: warm, no rash                           Head: + mild sinus tenderness,                            Eyes: conjunctiva normal, corneas clear, PERRLA                            Ears: pearly TMs, external ear canals normal                          Nose: septum midline, turbinates swollen, with erythema and clear discharge             Mouth/throat: MMM, tongue normal, mild pharyngeal erythema                           Neck: supple, no adenopathy, no thyromegaly, nontender                          Heart: RRR, normal S1, S2, no murmurs                         Lungs: CTA bilaterally, no wheezes, rales, or rhonchi  Abdomen: nontender, non distended, no hepatosplenomegaly                           MSK: nontender      Assessment and Plan:   Encounter Diagnoses  Name Primary?  . Sinusitis Yes  . Body aches    Flu test negative.  Advised that he should c/t the Augmentin and give it more time as he has  only been on it a few days.   Advised rest, hydrate well, nasal saline, and discussed ways to wean off Afrin.  If worse or not improving in 2-3 days, call or return.

## 2012-02-02 ENCOUNTER — Ambulatory Visit (INDEPENDENT_AMBULATORY_CARE_PROVIDER_SITE_OTHER): Payer: Medicare Other | Admitting: Family Medicine

## 2012-02-02 ENCOUNTER — Encounter: Payer: Self-pay | Admitting: Family Medicine

## 2012-02-02 VITALS — BP 104/60 | HR 64 | Temp 98.5°F | Ht 75.0 in | Wt 206.0 lb

## 2012-02-02 DIAGNOSIS — R6883 Chills (without fever): Secondary | ICD-10-CM

## 2012-02-02 DIAGNOSIS — J329 Chronic sinusitis, unspecified: Secondary | ICD-10-CM

## 2012-02-02 DIAGNOSIS — R350 Frequency of micturition: Secondary | ICD-10-CM

## 2012-02-02 LAB — CBC WITH DIFFERENTIAL/PLATELET
Basophils Absolute: 0 10*3/uL (ref 0.0–0.1)
Basophils Relative: 0 % (ref 0–1)
Eosinophils Absolute: 0.1 10*3/uL (ref 0.0–0.7)
MCHC: 34.3 g/dL (ref 30.0–36.0)
Neutro Abs: 5.5 10*3/uL (ref 1.7–7.7)
Neutrophils Relative %: 74 % (ref 43–77)
RDW: 12.2 % (ref 11.5–15.5)

## 2012-02-02 LAB — POCT URINALYSIS DIPSTICK
Leukocytes, UA: NEGATIVE
Nitrite, UA: NEGATIVE
Urobilinogen, UA: NEGATIVE
pH, UA: 5

## 2012-02-02 MED ORDER — AZITHROMYCIN 250 MG PO TABS
ORAL_TABLET | ORAL | Status: AC
Start: 2012-02-02 — End: 2012-02-07

## 2012-02-02 NOTE — Patient Instructions (Signed)
Stop the Augmentin (amox-clav). Change over to Z-pak--take two pills today, then one pill daily for the next 4 days. Make sure you are drinking plenty of fluids.  Some of your weight loss is probably related to poor fluid and food intake.  There is no evidence of urinary or prostate infection.  Follow up next week if you continue to feel bad.  We will contact you with your blood test results in the next 1-2 days.

## 2012-02-02 NOTE — Progress Notes (Signed)
Chief complaint:  Chills and body aches since Saturday. Saw Shane 01/30/12 for URI, he is stilll on abx and states that his sinuses are clear and feeling better. He has no appetitte and urinating almost every hour at night. Pt had a neg flu test 01/30/12 here in our office  HPI:  Originally seen at [facility] 5 days ago, then seen here 3 days ago in f/u.  He is on the 5th day of his Augmentin course, and his sinuses are feeling much better.  Not having as much nasal congestion, no longer having significant drainage, and can breathe through his nose now.  Still having some headaches (frontal).  He has been having the chills x 4 days.  Hasn't checked his temperature.  Woke up last night wringing wet. Denies rigors, just feeling very chilled. He reports feeling achey all over, finds that even his skin hurts to touch. Also having myalgias.  Denies joint pains. He has been taking Equate Ibuprofen PM at bedtime, took 3 at bedtime last night (previously only taking 2 at bedtime without problems)--woke up with sweats last night.  Never had fever at Urgent Care or at visit last week.    He is voiding every 1-1.5 hours at night, with weakened stream.  Denies any back pain.  Previously had prostatitis, but denies feeling similar, as he isn't having any back pain.  Slight increased urinary frequency during the day also.  Denies hematuria, dysuria.  Had L hip replacement surgery in December and denies any hip pain. He was also having some cramps in the backs of his legs, took a muscle relaxant he had, with good improvement. +decreased appetite.  Has lost 8 pounds since Friday.  Had some diarrhea this morning, and some nausea and dry heaves this morning.  Used a suppository this morning for nausea (likely phenergan).  Past Medical History  Diagnosis Date  . Panic disorder 2002  . Allergy   . Kidney stones     lithotripsy 01/2008 (Dr. Annabell Howells), cystoscopy and ureteroscopy 04/2020 ([facility])  . Pure hypercholesterolemia     . Alcohol abuse quit 04/20/1992  . DDD (degenerative disc disease), lumbar   . Asthma     PT STATES MILD ASTHMA-INHALERS ONLY IF NEEDED  -  . Anxiety     Past Surgical History  Procedure Date  . Appendectomy age 66  . Tonsillectomy and adenoidectomy age 66  . Left heel spur surgery 1989  . Arthroscopic knee surgery 1990,repeated in 1996    cartilage injury Left  . C spine sugery 1993  . Nasal polyp surgery 1993,repeat 2006  . Elbow surgery right, 1995, repeated 1996  . Laparoscopic cholecystectomy 1998  . Arthroscopic shoulder surgery left,2009,repeat 2010  . 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 Lequita Halt  . Kidney stone 03/2009, 04/2011    removal (cystoscopically) Dr. Annabell Howells  . Total hip revision 12/01/2011    Procedure: TOTAL HIP REVISION;  Surgeon: Gus Rankin Aluisio;  Location: [facility];  Service: Orthopedics;  Laterality: Left;    History   Social History  . Marital Status: Divorced    Spouse Name: N/A    Number of Children: 2  . Years of Education: N/A   Occupational History  . retired from [employer]    Social History Main Topics  . Smoking status: Never Smoker   . Smokeless tobacco: Never Used  . Alcohol Use: No     quit drinking 04/20/1992  . Drug Use: No  . Sexually  Active: Not Currently   Other Topics Concern  . Not on file   Social History Narrative   Lives alone, 1 dog.  Children are in [place], [place]    Family History  Problem Relation Age of Onset  . Hypertension Mother   . Anxiety disorder Mother   . Cancer Mother     male cancer  . Cancer Father     colon  . Panic disorder Daughter   . Diverticulosis Daughter   . Cancer Maternal Grandmother     breast and stomach  . Cancer Paternal Grandmother 76    colon cancer  . Alcohol abuse Brother   . Diabetes Neg Hx    Current Outpatient Prescriptions on File Prior to Visit  Medication Sig Dispense Refill  . Albuterol Sulfate (VENTOLIN HFA IN) Inhale 2 puffs into the lungs every 4 (four) hours as  needed. WHEEZING       . ALPRAZolam (XANAX) 0.5 MG tablet Take 0.5 mg by mouth 3 (three) times daily as needed. ANXIETY--PT USUSALLY WOULD ONLY TAKE AT BEDTIME BECAUSE HE CAN'T SLEEP       . fluticasone (FLOVENT HFA) 110 MCG/ACT inhaler Inhale 1 puff into the lungs 2 (two) times daily as needed. WHEEZING       . levocetirizine (XYZAL) 5 MG tablet Take 5 mg by mouth every morning.       Marland Kitchen PARoxetine (PAXIL) 40 MG tablet Take 1 tablet (40 mg total) by mouth every morning.  30 tablet  5  . polysaccharide iron (NIFEREX) 150 MG CAPS capsule Take 1 capsule (150 mg total) by mouth daily.  21 each  0  . rivaroxaban (XARELTO) 10 MG TABS tablet Take 1 tablet (10 mg total) by mouth daily with breakfast.  19 tablet  0    No Known Allergies  ROS:  See HPI--+nausea, diarrhea, headaches, chills. Improving URI symptoms.  Denies sore throat, cough, shortness of breath.  Denies dysuria, back pain, hip pain, skin rash or other concerns.  PHYSICAL EXAM: BP 104/60  Pulse 64  Temp(Src) 98.5 F (36.9 C) (Oral)  Ht 6\' 3"  (1.905 m)  Wt 206 lb (93.441 kg)  BMI 25.75 kg/m2 Well developed, pleasant male, in no distress.  Well-appearing. HEENT:  PERRL, EOMI, conjunctiva clear.  TM's and EAC's normal.  OP clear.  Nasal mucosa mildly edematous with clear-white mucus.  Mildly tender over sinuses x 4 Neck: no lymphadenopathy Heart: regular rate and rhythm without murmur Lungs: clear bilaterally Back: no CVA tenderness Prostate--nonboggy, nontender Skin: no rash, no bruising  Urine dip--ketones, otherwise normal  ASSESSMENT/PLAN: 1. Urinary frequency  POCT Urinalysis Dipstick  2. Sinusitis  azithromycin (ZITHROMAX) 250 MG tablet  3. Chills  CBC with Differential    Sinusitis, improving clinically, but complaining of skin pain, myalgias, and chills since being on the Augmentin, despite clinical improvement in sinus infection. Check CBC today.  Change ABX to Zpak. ?possibility of side effects from Augmentin, vs  sign of significant infection.  F/u if symptoms persist, worsen

## 2012-02-19 ENCOUNTER — Other Ambulatory Visit: Payer: Medicare Other

## 2012-03-01 ENCOUNTER — Encounter: Payer: Self-pay | Admitting: Medical

## 2012-03-01 ENCOUNTER — Ambulatory Visit (INDEPENDENT_AMBULATORY_CARE_PROVIDER_SITE_OTHER): Payer: Medicare Other | Admitting: Medical

## 2012-03-01 VITALS — BP 120/80 | HR 68 | Temp 97.7°F | Resp 16 | Wt 208.0 lb

## 2012-03-01 DIAGNOSIS — J309 Allergic rhinitis, unspecified: Secondary | ICD-10-CM

## 2012-03-01 DIAGNOSIS — J329 Chronic sinusitis, unspecified: Secondary | ICD-10-CM

## 2012-03-01 MED ORDER — MOMETASONE FUROATE 50 MCG/ACT NA SUSP: 2.0000 | Freq: Every day | NASAL | Status: DC

## 2012-03-01 MED ORDER — AZITHROMYCIN 250 MG PO TABS: ORAL_TABLET | ORAL | Status: DC

## 2012-03-01 NOTE — Patient Instructions (Signed)
For today, begin Azithromycin antibiotic, drink plenty of water, consider plain Mucinex, and continue the Xyzal daily.  Begin Nasonex 1-2 sprays per nostril twice daily the first week, then once daily after that.  Continue this through at least spring season.

## 2012-03-01 NOTE — Progress Notes (Signed)
Subjective: Here for another sinus issue.  He notes that he has hx/o chronic sinus issues, hx/o 2 sinus surgeries.  He was just seen here a month ago for sinus infection, had what he said was a flu after the sinuses got better with Augmentin.  He now reports 1 week hx/o sinus pressure, feels clogged up, but nothing coming out.  He also notes ear pressure, popping, but no fever, cough, sore throat.  Probably has some post nasal drainage.  Last few days using Afrin to help.  He is using his Xyzal daily in general.  He has tried nasal saline several times before, but feels like this clogs him up worse.   Past Medical History  Diagnosis Date  . Panic disorder 2002  . Allergy   . Kidney stones     lithotripsy 01/2008 (Dr. Annabell Howells), cystoscopy and ureteroscopy 04/2020 ([facility])  . Pure hypercholesterolemia   . Alcohol abuse quit 04/20/1992  . DDD (degenerative disc disease), lumbar   . Asthma     PT STATES MILD ASTHMA-INHALERS ONLY IF NEEDED  -  . Anxiety    ROS As noted in the HPI  Objective: Gen: wd, wn, and Skin: no rash HEENT: tender frontal sinuses, TM pearly, nares with some turbinate swelling, somewhat of a mucous odor, pharynx normal appearing Lungs: CTA Herat: RRR, no murmurs  Assessment: Encounter Diagnoses  Name Primary?  . Sinusitis, chronic Yes  . Allergic rhinitis    Plan: Sinusitis - Azithromycin, hydrate well, consider OTC Mucinex, rest, humidifier, call/return if worse or not improving.  Use caution with Afrin.  Allergic rhinitis - c/t Xyzal, begin Nasonex.  He has not done well with nasal saline.

## 2012-04-15 ENCOUNTER — Telehealth: Payer: Self-pay | Admitting: Internal Medicine

## 2012-04-15 MED ORDER — LEVOCETIRIZINE DIHYDROCHLORIDE 5 MG PO TABS: 5.0000 mg | ORAL_TABLET | ORAL | Status: DC

## 2012-04-16 NOTE — Telephone Encounter (Signed)
done

## 2012-07-26 ENCOUNTER — Other Ambulatory Visit: Payer: Self-pay | Admitting: Family Medicine

## 2012-09-10 ENCOUNTER — Telehealth: Payer: Self-pay | Admitting: Family Medicine

## 2012-09-10 DIAGNOSIS — J309 Allergic rhinitis, unspecified: Secondary | ICD-10-CM

## 2012-09-10 MED ORDER — FLUTICASONE PROPIONATE 50 MCG/ACT NA SUSP: 2.0000 | Freq: Every day | NASAL | Status: DC

## 2012-09-10 NOTE — Telephone Encounter (Signed)
He is on Rx Nasonex but he wants to change to Generic flonase  Due to [employer] aid    [place]

## 2012-09-10 NOTE — Telephone Encounter (Signed)
done

## 2012-09-21 ENCOUNTER — Other Ambulatory Visit: Payer: Self-pay | Admitting: Orthopedic Surgery

## 2012-09-21 MED ORDER — DEXAMETHASONE SODIUM PHOSPHATE 10 MG/ML IJ SOLN: 10.0000 mg | Freq: Once | INTRAMUSCULAR | Status: DC

## 2012-09-21 MED ORDER — BUPIVACAINE LIPOSOME 1.3 % IJ SUSP: 20.0000 mL | Freq: Once | INTRAMUSCULAR | Status: DC

## 2012-09-21 NOTE — Progress Notes (Signed)
Preoperative surgical orders have been place into the [facility] hospital system for Afton Mikelson on 09/21/2012, 8:15 PM  by Patrica Duel for surgery on 10/26/2012.  Preop Hip orders including Experel Injecion, IV Tylenol, and IV Decadron as long as there are no contraindications to the above medications. [name], PA-C

## 2012-10-04 ENCOUNTER — Other Ambulatory Visit: Payer: Self-pay | Admitting: Family Medicine

## 2012-10-19 ENCOUNTER — Encounter (HOSPITAL_COMMUNITY): Payer: Self-pay | Admitting: Pharmacy Technician

## 2012-10-21 ENCOUNTER — Encounter (HOSPITAL_COMMUNITY)
Admission: RE | Admit: 2012-10-21 | Discharge: 2012-10-21 | Disposition: A | Discharge status: Home / Self Care | Payer: Medicare Other | Source: Ambulatory Visit | Attending: Orthopedic Surgery | Admitting: Orthopedic Surgery

## 2012-10-21 ENCOUNTER — Encounter (HOSPITAL_COMMUNITY): Payer: Self-pay

## 2012-10-21 ENCOUNTER — Ambulatory Visit (HOSPITAL_COMMUNITY)
Admission: RE | Admit: 2012-10-21 | Discharge: 2012-10-21 | Disposition: A | Discharge status: Home / Self Care | Payer: Medicare Other | Source: Ambulatory Visit | Attending: Orthopedic Surgery | Admitting: Orthopedic Surgery

## 2012-10-21 DIAGNOSIS — Z01818 Encounter for other preprocedural examination: Secondary | ICD-10-CM | POA: Insufficient documentation

## 2012-10-21 LAB — CBC
MCH: 33.1 pg (ref 26.0–34.0)
Platelets: 206 10*3/uL (ref 150–400)
RBC: 4.32 MIL/uL (ref 4.22–5.81)
RDW: 12.5 % (ref 11.5–15.5)
WBC: 5.7 10*3/uL (ref 4.0–10.5)

## 2012-10-21 LAB — BASIC METABOLIC PANEL
CO2: 28 mEq/L (ref 19–32)
Calcium: 9.2 mg/dL (ref 8.4–10.5)
Chloride: 105 mEq/L (ref 96–112)
GFR calc Af Amer: >90 mL/min (ref >90)
Sodium: 140 mEq/L (ref 135–145)

## 2012-10-21 NOTE — Patient Instructions (Signed)
20      Your procedure is scheduled on:  Tuesday 10/26/2012 at 245 pm  Report to [facility] at 1215  pm  Call this number if you have problems the morning of surgery: [phone]   Remember:   Do not eat food after midnight!  YOU CAN HAVE CLEAR LIQUIDS FROM MIDNIGHT UP UNTIL 0845 AM THE MORNING OF SURGERY THEN NOTHING UNTIL AFTER SURGERY!  Take these medicines the morning of surgery with A SIP OF WATER: Paxil, Flonase nasal spray, use Albuterol inhaler if needed, use Flovent inhaler if needed   Do not bring valuables to the hospital.  .  Leave suitcase in the car. After surgery it may be brought to your room.  For patients admitted to the hospital, checkout time is 11:00 AM the day of              Discharge.    Special Instructions: See [facility] Preparing  For Surgery Instruction Sheet. Do not wear jewelry, lotions powders, perfumes. Women do not shave legs or underarms for 12 hours before showers. Contacts, partial plates, or dentures may not be worn into surgery.                          Patients discharged the day of surgery will not be allowed to drive home.   If going home the same day of surgery, must have someone stay with you first 24 hrs.at home and arrange for someone to drive you home from the Hospital.              Please read over the following fact sheets that you were given: MRSA              INFORMATION, Sleep apnea sheet, Incentive Spirometry sheet               Telford Nab.Ambriel Gorelick,RN,BSN [phone]

## 2012-10-26 ENCOUNTER — Ambulatory Visit (HOSPITAL_COMMUNITY): Payer: Medicare Other | Admitting: Anesthesiology

## 2012-10-26 ENCOUNTER — Encounter (HOSPITAL_COMMUNITY): Payer: Self-pay | Admitting: *Deleted

## 2012-10-26 ENCOUNTER — Ambulatory Visit (HOSPITAL_COMMUNITY)
Admission: RE | Admit: 2012-10-26 | Discharge: 2012-10-26 | Disposition: A | Discharge status: Home / Self Care | Payer: Medicare Other | Source: Ambulatory Visit | Attending: Orthopedic Surgery | Admitting: Orthopedic Surgery

## 2012-10-26 ENCOUNTER — Encounter (HOSPITAL_COMMUNITY)
Admission: RE | Disposition: A | Discharge status: Home / Self Care | Payer: Self-pay | Source: Ambulatory Visit | Attending: Orthopedic Surgery

## 2012-10-26 ENCOUNTER — Encounter (HOSPITAL_COMMUNITY): Payer: Self-pay | Admitting: Anesthesiology

## 2012-10-26 DIAGNOSIS — Z96649 Presence of unspecified artificial hip joint: Secondary | ICD-10-CM | POA: Insufficient documentation

## 2012-10-26 DIAGNOSIS — Z79899 Other long term (current) drug therapy: Secondary | ICD-10-CM | POA: Insufficient documentation

## 2012-10-26 DIAGNOSIS — M76899 Other specified enthesopathies of unspecified lower limb, excluding foot: Secondary | ICD-10-CM | POA: Insufficient documentation

## 2012-10-26 DIAGNOSIS — M7070 Other bursitis of hip, unspecified hip: Secondary | ICD-10-CM | POA: Diagnosis present

## 2012-10-26 DIAGNOSIS — M6688 Spontaneous rupture of other tendons, other: Secondary | ICD-10-CM | POA: Insufficient documentation

## 2012-10-26 DIAGNOSIS — E7800 Pure hypercholesterolemia, unspecified: Secondary | ICD-10-CM | POA: Insufficient documentation

## 2012-10-26 HISTORY — PX: TENDON REPAIR: SHX5111

## 2012-10-26 HISTORY — PX: EXCISION/RELEASE BURSA HIP: SHX5014

## 2012-10-26 SURGERY — RELEASE, BURSA, TROCHANTERIC
Anesthesia: General | Site: Hip | Laterality: Left | Wound class: Clean

## 2012-10-26 MED ORDER — BUPIVACAINE-EPINEPHRINE PF 0.25-1:200000 % IJ SOLN
INTRAMUSCULAR | Status: AC
  Filled 2012-10-26: qty 30

## 2012-10-26 MED ORDER — MEPERIDINE HCL 50 MG/ML IJ SOLN
6.2500 mg | INTRAMUSCULAR | Status: DC | PRN
  Administered 2012-10-26: 12.5 mg via INTRAVENOUS

## 2012-10-26 MED ORDER — LIDOCAINE HCL (CARDIAC) 20 MG/ML IV SOLN
INTRAVENOUS | Status: DC | PRN
  Administered 2012-10-26: 100 mg via INTRAVENOUS

## 2012-10-26 MED ORDER — MEPERIDINE HCL 50 MG/ML IJ SOLN
INTRAMUSCULAR | Status: AC
  Filled 2012-10-26: qty 1

## 2012-10-26 MED ORDER — HYDROMORPHONE HCL PF 1 MG/ML IJ SOLN
0.2500 mg | INTRAMUSCULAR | Status: DC | PRN
  Administered 2012-10-26: 0.5 mg via INTRAVENOUS

## 2012-10-26 MED ORDER — ONDANSETRON HCL 4 MG/2ML IJ SOLN
INTRAMUSCULAR | Status: DC | PRN
  Administered 2012-10-26: 4 mg via INTRAVENOUS

## 2012-10-26 MED ORDER — MIDAZOLAM HCL 5 MG/5ML IJ SOLN
INTRAMUSCULAR | Status: DC | PRN
  Administered 2012-10-26: 2 mg via INTRAVENOUS

## 2012-10-26 MED ORDER — CHLORHEXIDINE GLUCONATE 4 % EX LIQD
60.0000 mL | Freq: Once | CUTANEOUS | Status: DC
  Filled 2012-10-26: qty 60

## 2012-10-26 MED ORDER — SUCCINYLCHOLINE CHLORIDE 20 MG/ML IJ SOLN
INTRAMUSCULAR | Status: DC | PRN
  Administered 2012-10-26: 100 mg via INTRAVENOUS

## 2012-10-26 MED ORDER — CEFAZOLIN SODIUM-DEXTROSE 2-3 GM-% IV SOLR
2.0000 g | INTRAVENOUS | Status: AC
  Administered 2012-10-26: 2 g via INTRAVENOUS

## 2012-10-26 MED ORDER — ACETAMINOPHEN 10 MG/ML IV SOLN
1000.0000 mg | Freq: Once | INTRAVENOUS | Status: AC
  Administered 2012-10-26: 1000 mg via INTRAVENOUS

## 2012-10-26 MED ORDER — GLYCOPYRROLATE 0.2 MG/ML IJ SOLN
INTRAMUSCULAR | Status: DC | PRN
  Administered 2012-10-26: 0.2 mg via INTRAVENOUS

## 2012-10-26 MED ORDER — ROCURONIUM BROMIDE 100 MG/10ML IV SOLN
INTRAVENOUS | Status: DC | PRN
  Administered 2012-10-26: 20 mg via INTRAVENOUS
  Administered 2012-10-26: 10 mg via INTRAVENOUS

## 2012-10-26 MED ORDER — FENTANYL CITRATE 0.05 MG/ML IJ SOLN
INTRAMUSCULAR | Status: DC | PRN
  Administered 2012-10-26: 150 ug via INTRAVENOUS
  Administered 2012-10-26 (×2): 50 ug via INTRAVENOUS

## 2012-10-26 MED ORDER — BUPIVACAINE LIPOSOME 1.3 % IJ SUSP
20.0000 mL | Freq: Once | INTRAMUSCULAR | Status: DC
  Filled 2012-10-26: qty 20

## 2012-10-26 MED ORDER — ACETAMINOPHEN 10 MG/ML IV SOLN
INTRAVENOUS | Status: AC
  Filled 2012-10-26: qty 100

## 2012-10-26 MED ORDER — CEFAZOLIN SODIUM-DEXTROSE 2-3 GM-% IV SOLR
INTRAVENOUS | Status: AC
  Filled 2012-10-26: qty 50

## 2012-10-26 MED ORDER — PROPOFOL 10 MG/ML IV BOLUS
INTRAVENOUS | Status: DC | PRN
  Administered 2012-10-26: 170 mg via INTRAVENOUS

## 2012-10-26 MED ORDER — HYDROMORPHONE HCL PF 1 MG/ML IJ SOLN
INTRAMUSCULAR | Status: AC
  Filled 2012-10-26: qty 1

## 2012-10-26 MED ORDER — METHOCARBAMOL 500 MG PO TABS: 500.0000 mg | ORAL_TABLET | Freq: Four times a day (QID) | ORAL | Status: DC

## 2012-10-26 MED ORDER — LACTATED RINGERS IV SOLN
INTRAVENOUS | Status: DC
  Administered 2012-10-26: 17:00:00 via INTRAVENOUS
  Administered 2012-10-26: 1000 mL via INTRAVENOUS

## 2012-10-26 MED ORDER — SODIUM CHLORIDE 0.9 % IV SOLN: INTRAVENOUS | Status: DC

## 2012-10-26 MED ORDER — BUPIVACAINE LIPOSOME 1.3 % IJ SUSP
INTRAMUSCULAR | Status: DC | PRN
  Administered 2012-10-26: 20 mL

## 2012-10-26 MED ORDER — LACTATED RINGERS IV SOLN: INTRAVENOUS | Status: DC

## 2012-10-26 MED ORDER — HYDROCODONE-ACETAMINOPHEN 5-325 MG PO TABS: 1.0000 | ORAL_TABLET | Freq: Four times a day (QID) | ORAL | Status: DC | PRN

## 2012-10-26 SURGICAL SUPPLY — 40 items
ANCHOR SUPER QUICK (Anchor) ×2 IMPLANT
BAG ZIPLOCK 12X15 (MISCELLANEOUS) ×2 IMPLANT
BIT DRILL 2.8 QUICK RELEASE (BIT) ×1 IMPLANT
BLADE EXTENDED COATED 6.5IN (ELECTRODE) ×2 IMPLANT
CLOTH BEACON ORANGE TIMEOUT ST (SAFETY) ×2 IMPLANT
CLSR STERI-STRIP ANTIMIC 1/2X4 (GAUZE/BANDAGES/DRESSINGS) ×2 IMPLANT
DRAPE INCISE IOBAN 66X45 STRL (DRAPES) ×2 IMPLANT
DRAPE ORTHO SPLIT 77X108 STRL (DRAPES) ×2
DRAPE POUCH INSTRU U-SHP 10X18 (DRAPES) ×2 IMPLANT
DRAPE SURG ORHT 6 SPLT 77X108 (DRAPES) ×2 IMPLANT
DRAPE U-SHAPE 47X51 STRL (DRAPES) ×2 IMPLANT
DRILL 2.8 QUICK RELEASE (BIT) ×2
DRSG ADAPTIC 3X8 NADH LF (GAUZE/BANDAGES/DRESSINGS) ×2 IMPLANT
DRSG MEPILEX BORDER 4X4 (GAUZE/BANDAGES/DRESSINGS) IMPLANT
DRSG MEPILEX BORDER 4X8 (GAUZE/BANDAGES/DRESSINGS) ×2 IMPLANT
ELECT REM PT RETURN 9FT ADLT (ELECTROSURGICAL) ×2
ELECTRODE REM PT RTRN 9FT ADLT (ELECTROSURGICAL) ×1 IMPLANT
GLOVE BIO SURGEON STRL SZ7.5 (GLOVE) ×2 IMPLANT
GLOVE BIO SURGEON STRL SZ8 (GLOVE) ×4 IMPLANT
GOWN STRL NON-REIN LRG LVL3 (GOWN DISPOSABLE) IMPLANT
GOWN STRL REIN XL XLG (GOWN DISPOSABLE) ×2 IMPLANT
IV SET HUBERPLUS 22X1 SAFETY (NEEDLE) IMPLANT
MANIFOLD NEPTUNE II (INSTRUMENTS) ×2 IMPLANT
NS IRRIG 1000ML POUR BTL (IV SOLUTION) ×2 IMPLANT
PACK TOTAL JOINT (CUSTOM PROCEDURE TRAY) ×2 IMPLANT
PASSER SUT SWANSON 36MM LOOP (INSTRUMENTS) ×2 IMPLANT
POSITIONER SURGICAL ARM (MISCELLANEOUS) ×2 IMPLANT
SPONGE GAUZE 4X4 12PLY (GAUZE/BANDAGES/DRESSINGS) ×2 IMPLANT
STAPLER VISISTAT 35W (STAPLE) ×2 IMPLANT
STRIP CLOSURE SKIN 1/2X4 (GAUZE/BANDAGES/DRESSINGS) ×2 IMPLANT
SUT ETHIBOND NAB CT1 #1 30IN (SUTURE) ×4 IMPLANT
SUT MNCRL AB 4-0 PS2 18 (SUTURE) ×2 IMPLANT
SUT VIC AB 1 CT1 27 (SUTURE) ×3
SUT VIC AB 1 CT1 27XBRD ANTBC (SUTURE) ×3 IMPLANT
SUT VIC AB 2-0 CT1 27 (SUTURE) ×3
SUT VIC AB 2-0 CT1 TAPERPNT 27 (SUTURE) ×3 IMPLANT
SUT VLOC 180 0 24IN GS25 (SUTURE) ×2 IMPLANT
SYR 30ML LL (SYRINGE) ×2 IMPLANT
TOWEL OR 17X26 10 PK STRL BLUE (TOWEL DISPOSABLE) ×4 IMPLANT
WATER STERILE IRR 1500ML POUR (IV SOLUTION) IMPLANT

## 2012-10-26 NOTE — Anesthesia Preprocedure Evaluation (Addendum)
Anesthesia Evaluation  Patient identified by MRN, date of birth, ID band Patient awake    Reviewed: Allergy & Precautions, H&P , NPO status , Patient's Chart, lab work & pertinent test results  Airway Mallampati: II TM Distance: >3 FB Neck ROM: full    Dental  (+) Caps and Dental Advisory Given,    Pulmonary asthma ,  Mild asthma breath sounds clear to auscultation  Pulmonary exam normal       Cardiovascular Exercise Tolerance: Good negative cardio ROS  Rhythm:regular Rate:Normal     Neuro/Psych Anxiety Panic disorder 2002Cervical fusion negative neurological ROS  negative psych ROS   GI/Hepatic negative GI ROS, Neg liver ROS,   Endo/Other  negative endocrine ROS  Renal/GU negative Renal ROS  negative genitourinary   Musculoskeletal   Abdominal   Peds  Hematology negative hematology ROS (+)   Anesthesia Other Findings   Reproductive/Obstetrics negative OB ROS                          Anesthesia Physical Anesthesia Plan  ASA: II  Anesthesia Plan: General   Post-op Pain Management:    Induction: Intravenous  Airway Management Planned: Oral ETT  Additional Equipment:   Intra-op Plan:   Post-operative Plan: Extubation in OR  Informed Consent: I have reviewed the patients History and Physical, chart, labs and discussed the procedure including the risks, benefits and alternatives for the proposed anesthesia with the patient or authorized representative who has indicated his/her understanding and acceptance.   Dental Advisory Given  Plan Discussed with: Surgeon  Anesthesia Plan Comments:         Anesthesia Quick Evaluation

## 2012-10-26 NOTE — Brief Op Note (Signed)
10/26/2012  4:53 PM  PATIENT:  Lavella Lemons  66 y.o. male  PRE-OPERATIVE DIAGNOSIS:  Left Hip Gluteal Tendon Tear  POST-OPERATIVE DIAGNOSIS:  left hip gluteal tendon tear  PROCEDURE:  Procedure(s) (LRB) with comments: EXCISION/RELEASE BURSA HIP (Left) - Left Hip Bursectomy with Tendon Repair TENDON REPAIR (Left)  SURGEON:  Surgeon(s) and Role:    * Loanne Drilling, MD - Primary  PHYSICIAN ASSISTANT:   ASSISTANTS: Dimitri Ped, PA-C   ANESTHESIA:   general  EBL:     BLOOD ADMINISTERED:none  DRAINS: none   LOCAL MEDICATIONS USED:  OTHER Exparel 20 ml mixed with 50 ml saline  COUNTS:  YES  TOURNIQUET:  * No tourniquets in log *  DICTATION: .Other Dictation: Dictation Number [id number]  PLAN OF CARE: Discharge to home after PACU  PATIENT DISPOSITION:  PACU - hemodynamically stable.

## 2012-10-26 NOTE — Transfer of Care (Signed)
Immediate Anesthesia Transfer of Care Note  Patient: [name]  Procedure(s) Performed: Procedure(s) (LRB) with comments: EXCISION/RELEASE BURSA HIP (Left) - Left Hip Bursectomy with Tendon Repair TENDON REPAIR (Left)  Patient Location: PACU  Anesthesia Type:General  Level of Consciousness: awake, alert  and oriented  Airway & Oxygen Therapy: Patient Spontanous Breathing and Patient connected to face mask oxygen  Post-op Assessment: Report given to PACU RN  Post vital signs: Reviewed and stable  Complications: No apparent anesthesia complications

## 2012-10-26 NOTE — Interval H&P Note (Signed)
History and Physical Interval Note:  10/26/2012 3:26 PM  [name]  has presented today for surgery, with the diagnosis of Left Hip Gluteal Tendon Tear  The various methods of treatment have been discussed with the patient and family. After consideration of risks, benefits and other options for treatment, the patient has consented to  Procedure(s) (LRB) with comments: EXCISION/RELEASE BURSA HIP (Left) - Left Hip Bursectomy with Tendon Repair TENDON REPAIR (Left) as a surgical intervention .  The patient's history has been reviewed, patient examined, no change in status, stable for surgery.  I have reviewed the patient's chart and labs.  Questions were answered to the patient's satisfaction.     Loanne Drilling

## 2012-10-26 NOTE — Anesthesia Procedure Notes (Signed)
Procedure Name: Intubation Date/Time: 10/26/2012 3:57 PM Performed by: Hulan Fess Pre-anesthesia Checklist: Patient identified, Emergency Drugs available, Suction available, Patient being monitored and Timeout performed Patient Re-evaluated:Patient Re-evaluated prior to inductionOxygen Delivery Method: Circle system utilized Preoxygenation: Pre-oxygenation with 100% oxygen Intubation Type: IV induction Ventilation: Mask ventilation without difficulty Laryngoscope Size: Mac and 3 Grade View: Grade II Tube type: Oral Tube size: 8.0 mm Number of attempts: 1 Placement Confirmation: ETT inserted through vocal cords under direct vision and positive ETCO2 Secured at: 22 cm Tube secured with: Tape Dental Injury: Teeth and Oropharynx as per pre-operative assessment

## 2012-10-26 NOTE — Anesthesia Postprocedure Evaluation (Signed)
  Anesthesia Post-op Note  Patient: [name]  Procedure(s) Performed: Procedure(s) (LRB): EXCISION/RELEASE BURSA HIP (Left) TENDON REPAIR (Left)  Patient Location: PACU  Anesthesia Type: General  Level of Consciousness: awake and alert   Airway and Oxygen Therapy: Patient Spontanous Breathing  Post-op Pain: mild  Post-op Assessment: Post-op Vital signs reviewed, Patient's Cardiovascular Status Stable, Respiratory Function Stable, Patent Airway and No signs of Nausea or vomiting  Post-op Vital Signs: stable  Complications: No apparent anesthesia complications

## 2012-10-26 NOTE — H&P (Signed)
CC- [name] is a 66 y.o. male who presents with left hip pain  Hip Pain: Patient complains of left hip pain. Onset of the symptoms was several months ago. Inciting event: none. He has had persistent lateral hip paibn since his left hip revision. It has not responded to injections or PT. MARS MRI did not show any pseudotumor or fluid collection. MRI suggested gluteal tendon tear. He presents for exploration and repair.  Past Medical History  Diagnosis Date  . Panic disorder 2002  . Allergy   . Kidney stones     lithotripsy 01/2008 (Dr. Annabell Howells), cystoscopy and ureteroscopy 04/2020 ([facility])  . Pure hypercholesterolemia   . Alcohol abuse quit 04/20/1992  . DDD (degenerative disc disease), lumbar   . Asthma     PT STATES MILD ASTHMA-INHALERS ONLY IF NEEDED  -  . Anxiety     Past Surgical History  Procedure Date  . Appendectomy age 27  . Tonsillectomy and adenoidectomy age 86  . Left heel spur surgery 1989  . Arthroscopic knee surgery 1990,repeated in 1996    cartilage injury Left  . C spine sugery 1993  . Nasal polyp surgery 1993,repeat 2006  . Elbow surgery right, 1995, repeated 1996  . Laparoscopic cholecystectomy 1998  . Arthroscopic shoulder surgery left,2009,repeat 2010  . 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 Lequita Halt  . Kidney stone 03/2009, 04/2011    removal (cystoscopically) Dr. Annabell Howells  . Total hip revision 12/01/2011    Procedure: TOTAL HIP REVISION;  Surgeon: Gus Rankin Ryli Standlee;  Location: [facility];  Service: Orthopedics;  Laterality: Left;    Prior to Admission medications   Medication Sig Start Date End Date Taking? Authorizing Provider  acetaminophen (TYLENOL) 500 MG tablet Take 500 mg by mouth every 6 (six) hours as needed. For pain   Yes Historical Provider, MD  albuterol (PROVENTIL HFA;VENTOLIN HFA) 108 (90 BASE) MCG/ACT inhaler Inhale 2 puffs into the lungs every 6 (six) hours as needed.   Yes Historical Provider, MD  ALPRAZolam Prudy Feeler) 0.5 MG tablet Take  0.5 mg by mouth 3 (three) times daily as needed. ANXIETY--PT USUSALLY WOULD ONLY TAKE AT BEDTIME BECAUSE HE CAN'T SLEEP   Yes Historical Provider, MD  fluticasone (FLONASE) 50 MCG/ACT nasal spray Place 2 sprays into the nose daily.   Yes Historical Provider, MD  fluticasone (FLOVENT HFA) 110 MCG/ACT inhaler Inhale 1 puff into the lungs 2 (two) times daily.   Yes Historical Provider, MD  levocetirizine (XYZAL) 5 MG tablet Take 5 mg by mouth daily with breakfast.    Yes Historical Provider, MD  PARoxetine (PAXIL) 40 MG tablet Take 40 mg by mouth every morning.   Yes Historical Provider, MD  Multiple Vitamin (MULTIVITAMIN WITH MINERALS) TABS Take 1 tablet by mouth daily.    Historical Provider, MD    Physical Examination: General appearance - alert, well appearing, and in no distress Mental status - alert, oriented to person, place, and time Chest - clear to auscultation, no wheezes, rales or rhonchi, symmetric air entry Heart - normal rate, regular rhythm, normal S1, S2, no murmurs, rubs, clicks or gallops Abdomen - soft, nontender, nondistended, no masses or organomegaly Neurological - alert, oriented, normal speech, no focal findings or movement disorder noted  A left hip exam was performed. SWELLING: none WARMTH: no warmth TENDERNESS: maximal at greater trochanter ROM: normal STRENGTH: equal bilaterally  ASSESSMENT: Left hip recurrent bursitis and gluteal tendon tear  Plan Left hip bursectomy and tendon  repair. Discussed procedure, risks and potential complications and he elects to proceed.  Gus Rankin Akaiya Touchette, MD    10/26/2012, 1:05 PM

## 2012-10-27 ENCOUNTER — Encounter (HOSPITAL_COMMUNITY): Payer: Self-pay | Admitting: Orthopedic Surgery

## 2012-10-27 NOTE — Op Note (Signed)
NAME:  [name], [name]           ACCOUNT NO.:  [id number]  MEDICAL RECORD NO.:  [id number]  LOCATION:  [facility]                         FACILITY:  [facility]  PHYSICIAN:  Ollen Gross, M.D.    DATE OF BIRTH:  10/20/1946  DATE OF PROCEDURE:  10/26/2012 DATE OF DISCHARGE:  10/26/2012                              OPERATIVE REPORT   PREOPERATIVE DIAGNOSIS:  Left hip recurrent intractable bursitis with gluteal tendon tear.  POSTOPERATIVE DIAGNOSIS:  Left hip recurrent intractable bursitis with gluteal tendon tear.  PROCEDURE:  Left hip bursectomy with gluteal tendon repair.  SURGEON:  Ollen Gross, MD  ASSISTANT:  Dimitri Ped, PA-C  ANESTHESIA:  General.  ESTIMATED BLOOD LOSS:  Minimal.  DRAINS:  None.  COMPLICATIONS:  None.  CONDITION:  Stable to recovery.  CLINICAL NOTE:  Mr. Antavion is a 66 year old male.  He has a complex history in regards to his left hip.  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  He did well initially and he has had significant persistent lateral hip pain.  This has not responded to physical therapy or injection.  MARS MRI showed no fluid collection but did show a possible tendon tear.  He presents now for left hip bursectomy and tendon repair with possible arthrotomy.  PROCEDURE IN DETAIL:  After successful administration of general anesthetic, the patient was placed in the right lateral decubitus position with the left side up and held with the hip positioner.  The left lower extremity was isolated from perineum with plastic drapes and prepped and draped in the usual sterile fashion.  Previous posterolateral incisions were utilized.  Skin cut with a 10 blade through subcutaneous tissue to the level of fascia lata which incised in line with the skin incision.  There was some  hypertrophic bursal tissue present which was excised.  No evidence of any damage to the gluteal muscles, but there was a significant thinning of the tendon attachment onto the greater trochanter.  I felt that this needs to be reinforced and repaired.  I did an arthrotomy to make sure there was no fluid in the joint.  The joint was opened.  There was no fluid but there was some hypertrophic capsule.  I removed some of the capsule internally as I felt that may have been causing some impingement of the hip.  This was excised and the capsule appeared normal.  We then placed a Mitek anchor into the greater trochanter and repaired the gluteus medius tendon back to the bone.  I over sewed this then over sewed the repair of the pseudocapsule back to the femur.  We thoroughly irrigated with saline prior to doing this.  I inspected for any of the bursal tissue and none were present.  All the muscle had a normal appearance.  The wound was further irrigated and meticulous hemostasis achieved.  The fascia lata was closed with a running #1 V-Loc, subcu closed in 2 layers with interrupted 2-0 Vicryl, the subcuticular closed with running 4-0 Monocryl.  Prior to closure, a total  of 20 mL of Exparel with 50 mL of saline were injected into the gluteal muscles, the fascia lata, the subcu tissues.  After completing the closure, the incisions were clean and dry and Steri-Strips and bulky sterile dressing applied.  He was then awakened and transported to recovery in stable condition.     Ollen Gross, M.D.     FA/MEDQ  D:  10/26/2012  T:  10/27/2012  Job:  [id number]

## 2012-11-17 ENCOUNTER — Ambulatory Visit (INDEPENDENT_AMBULATORY_CARE_PROVIDER_SITE_OTHER): Payer: Medicare Other | Admitting: Family Medicine

## 2012-11-17 ENCOUNTER — Encounter: Payer: Self-pay | Admitting: Family Medicine

## 2012-11-17 VITALS — BP 120/88 | HR 60 | Ht 75.0 in | Wt 220.0 lb

## 2012-11-17 DIAGNOSIS — Z86010 Personal history of colonic polyps: Secondary | ICD-10-CM

## 2012-11-17 DIAGNOSIS — Z23 Encounter for immunization: Secondary | ICD-10-CM

## 2012-11-17 DIAGNOSIS — Z125 Encounter for screening for malignant neoplasm of prostate: Secondary | ICD-10-CM

## 2012-11-17 DIAGNOSIS — E7800 Pure hypercholesterolemia, unspecified: Secondary | ICD-10-CM

## 2012-11-17 DIAGNOSIS — Z131 Encounter for screening for diabetes mellitus: Secondary | ICD-10-CM

## 2012-11-17 DIAGNOSIS — Z0000 Encounter for general adult medical examination without abnormal findings: Secondary | ICD-10-CM

## 2012-11-17 DIAGNOSIS — F410 Panic disorder [episodic paroxysmal anxiety] without agoraphobia: Secondary | ICD-10-CM

## 2012-11-17 DIAGNOSIS — J309 Allergic rhinitis, unspecified: Secondary | ICD-10-CM

## 2012-11-17 MED ORDER — PAROXETINE HCL 40 MG PO TABS: 40.0000 mg | ORAL_TABLET | ORAL | Status: DC

## 2012-11-17 NOTE — Progress Notes (Signed)
Chief Complaint  Patient presents with  . Med Check Plus    fasting med check plus. Patient turns 66 next month and would like to see if you cam change his asthma inhalers to generics.(he was a little unsure about his inhaled steroid med-I looked in chart and in 2011 he was taking Symbicort) Pt declined flu vaccine.   Patient presents for med check, and annual wellness visit.  He has no specific concerns other than lowering cost of his medications, preferring generics.   Asthma: doing well overall.  Used Flovent over the summer, hasn't needed since, and hasn't needed to use albuterol at all. Breathing seems to be doing much better since allergies have been better controlled.  Allergies:  Well controlled with flonase and xyzal.  Hasn't had sinus issues since February, since using the medications daily.  Anxiety and panic disorder:  Well controlled with Paxil.  Only rarely needs to use xanax, "if I get very excited" and has trouble sleeping.  Doesn't even need it once a month (last filled by Dr. Zenaida Niece).  Hyperlipidemia:  He has been prescribed meds in the past, but admits to never taking them, worries about effects on liver.  Last lipids checked 1 year ago and LDL was 169, chol/HDL ratio was 4.7.  He improved his diet, but never returned for repeat lipids in 3 months.  He is fasting today for labs.  Annual Wellness Visit: Other MD's involved in patient's care: Dr. Lequita Halt (ortho) Dr. Leticia Penna (dentist). Last ophtho was a few years ago  End of life issues:  Doesn't have a living will or health care power of attorney. Depression, fall risk and ADL screenings done.  See scanned sheets--no concerns.  Health maintenance: Immunization History  Administered Date(s) Administered  . Influenza Split 11/12/2011  . Pneumococcal Conjugate 05/22/2001  . Td 05/22/2001  . Tdap 09/11/2008   Last colonoscopy: 01/2010 Dr. Laural Benes, every 5 years Last PSA: 10/2011 Dentist: regularly Ophtho: a few years  ago Exercise daily  Past Medical History  Diagnosis Date  . Panic disorder 2002  . Allergy   . Kidney stones     lithotripsy 01/2008 (Dr. Annabell Howells), cystoscopy and ureteroscopy 04/2011 ([facility])  . Pure hypercholesterolemia   . Alcohol abuse quit 04/20/1992  . DDD (degenerative disc disease), lumbar   . Asthma     PT STATES MILD ASTHMA-INHALERS ONLY IF NEEDED  -  . Anxiety     Past Surgical History  Procedure Date  . Appendectomy age 84  . Tonsillectomy and adenoidectomy age 34  . Left heel spur surgery 1989  . Arthroscopic knee surgery 1990,repeated in 1996    cartilage injury Left  . C spine sugery 1993  . Nasal polyp surgery 1993,repeat 2006  . Elbow surgery right, 1995, repeated 1996  . Laparoscopic cholecystectomy 1998  . Arthroscopic shoulder surgery left,2009,repeat 2010  . 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 Lequita Halt  . Kidney stone 03/2009, 04/2011    removal (cystoscopically) Dr. Annabell Howells  . Total hip revision 12/01/2011    Procedure: TOTAL HIP REVISION;  Surgeon: Gus Rankin Aluisio;  Location: [facility];  Service: Orthopedics;  Laterality: Left;  . Excision/release bursa hip 10/26/2012    Procedure: EXCISION/RELEASE BURSA HIP;  Surgeon: Loanne Drilling, MD;  Location: [facility];  Service: Orthopedics;  Laterality: Left;  Left Hip Bursectomy with Tendon Repair  . Tendon repair 10/26/2012    Procedure: TENDON REPAIR;  Surgeon: Loanne Drilling, MD;  Location: [facility];  Service: Orthopedics;  Laterality: Left;    History   Social History  . Marital Status: Divorced    Spouse Name: N/A    Number of Children: 2  . Years of Education: N/A   Occupational History  . retired from [employer]    Social History Main Topics  . Smoking status: Never Smoker   . Smokeless tobacco: Never Used  . Alcohol Use: No     Comment: quit drinking 04/20/1992  . Drug Use: No  . Sexually Active: Not Currently   Other Topics Concern  . Not on file   Social History Narrative   Lives alone, 1 dog.   Children are in [place], [place]    Family History  Problem Relation Age of Onset  . Hypertension Mother   . Anxiety disorder Mother   . Cancer Mother     male cancer  . Cancer Father     colon  . Panic disorder Daughter   . Diverticulosis Daughter   . Cancer Maternal Grandmother     breast and stomach  . Heart disease Maternal Grandmother   . Cancer Paternal Grandmother 23    colon cancer  . Alcohol abuse Brother   . Diabetes Neg Hx     Current outpatient prescriptions:fluticasone (FLONASE) 50 MCG/ACT nasal spray, Place 2 sprays into the nose daily., Disp: , Rfl: ;  HYDROcodone-acetaminophen (NORCO) 5-325 MG per tablet, Take 1-2 tablets by mouth every 6 (six) hours as needed for pain., Disp: 50 tablet, Rfl: 1;  levocetirizine (XYZAL) 5 MG tablet, Take 5 mg by mouth daily with breakfast. , Disp: , Rfl:  Multiple Vitamin (MULTIVITAMIN WITH MINERALS) TABS, Take 1 tablet by mouth daily., Disp: , Rfl: ;  PARoxetine (PAXIL) 40 MG tablet, Take 1 tablet (40 mg total) by mouth every morning., Disp: 30 tablet, Rfl: 11;  acetaminophen (TYLENOL) 500 MG tablet, Take 500 mg by mouth every 6 (six) hours as needed. For pain, Disp: , Rfl:  albuterol (PROVENTIL HFA;VENTOLIN HFA) 108 (90 BASE) MCG/ACT inhaler, Inhale 2 puffs into the lungs every 6 (six) hours as needed., Disp: , Rfl: ;  ALPRAZolam (XANAX) 0.5 MG tablet, Take 0.5 mg by mouth 3 (three) times daily as needed. ANXIETY--PT USUSALLY WOULD ONLY TAKE AT BEDTIME BECAUSE HE CAN'T SLEEP, Disp: , Rfl: ;  fluticasone (FLOVENT HFA) 110 MCG/ACT inhaler, Inhale 1 puff into the lungs 2 (two) times daily., Disp: , Rfl:  methocarbamol (ROBAXIN) 500 MG tablet, Take 1 tablet (500 mg total) by mouth 4 (four) times daily., Disp: 30 tablet, Rfl: 1;  [DISCONTINUED] polysaccharide iron (NIFEREX) 150 MG CAPS capsule, Take 1 capsule (150 mg total) by mouth daily., Disp: 21 each, Rfl: 0;  [DISCONTINUED] rivaroxaban (XARELTO) 10 MG TABS tablet, Take 1 tablet (10 mg  total) by mouth daily with breakfast., Disp: 19 tablet, Rfl: 0  No Known Allergies  ROS:  The patient denies anorexia, fever, weight changes, headaches,  vision loss, decreased hearing, ear pain, hoarseness, chest pain, palpitations, dizziness, syncope, dyspnea on exertion, cough, swelling, nausea, vomiting, diarrhea, constipation, abdominal pain, melena, hematochezia, indigestion/heartburn, hematuria, incontinence, erectile dysfunction, nocturia, weakened urine stream, dysuria, genital lesions, joint pains (some residual incisional pain L hip from recent surgery), numbness, tingling, weakness, tremor, suspicious skin lesions, depression, anxiety, abnormal bleeding/bruising, or enlarged lymph nodes Up 1-2 times/night to void, drinks a lot of water at night.   PHYSICAL EXAM BP 120/88  Pulse 60  Ht 6\' 3"  (1.905 m)  Wt 220 lb (99.791 kg)  BMI  27.50 kg/m2  General Appearance:    Alert, cooperative, no distress, appears stated age  Head:    Normocephalic, without obvious abnormality, atraumatic  Eyes:    PERRL, conjunctiva/corneas clear, EOM's intact, fundi    benign  Ears:    Normal TM's and external ear canals  Nose:   Nares normal, mucosa normal, no drainage or sinus   tenderness  Throat:   Lips, mucosa, and tongue normal; teeth and gums normal  Neck:   Supple, no lymphadenopathy;  thyroid:  no   enlargement/tenderness/nodules; no carotid   bruit or JVD  Back:    Spine nontender, no curvature, ROM normal, no CVA     tenderness  Lungs:     Clear to auscultation bilaterally without wheezes, rales or     ronchi; respirations unlabored  Chest Wall:    No tenderness or deformity   Heart:    Regular rate and rhythm, S1 and S2 normal, no murmur, rub   or gallop  Breast Exam:    No chest wall tenderness, masses or gynecomastia  Abdomen:     Soft, non-tender, nondistended, normoactive bowel sounds,    no masses, no hepatosplenomegaly  Genitalia:    Normal male external genitalia without lesions.   Testicles without masses.  No inguinal hernias.  Rectal:    Normal sphincter tone, no masses or tenderness; guaiac negative stool.  Prostate smooth, no nodules, moderately enlarged.  Extremities:   No clubbing, cyanosis or edema  Pulses:   2+ and symmetric all extremities  Skin:   Skin color, texture, turgor normal, no rashes or lesions.  Healing incision L lateral hip  Lymph nodes:   Cervical, supraclavicular, and axillary nodes normal  Neurologic:   CNII-XII intact, normal strength, sensation and gait; reflexes 2+ and symmetric throughout          Psych:   Normal mood, affect, hygiene and grooming.   ASSESSMENT/PLAN:  1. Pure hypercholesterolemia  Lipid panel  2. PANIC DISORDER  PARoxetine (PAXIL) 40 MG tablet  3. COLONIC POLYPS, ADENOMATOUS, HX OF    4. Allergic rhinitis    5. Screening for diabetes mellitus  Glucose, random  6. Special screening for malignant neoplasm of prostate  PSA, Medicare  7. Need for prophylactic vaccination and inoculation against influenza  Flu vaccine greater than or equal to 3yo preservative free IM    Recent chem, CBC in hospital. PSA, lipid, glucose today  Flu shot given--he was very concerned about risks of mercury and we discussed risks/benefits, and that shot is thimerisol-free Pneumovax booster recommended, even though he isn't 66 yet, due to his h/o asthma, and last vaccine >10 years ago. He declines and would like to wait until next year. Shingles vaccine recommended.  Risks/benefits reviewed.  rx given to patient and advised to wait a month.  End of life care--given handout/info on Living Will and healthcare power of attorney.  MOST form filled out. Hyperlipidemia--reviewed risks of untreated lipids, vs potential side effects of meds (that he has never tried). Allergies--controlled Asthma--much improved since allergies improved, hasn't been on preventative meds in a few months.  He will check with pharmacy re: generic inhaled steroids and call  for rx when needed.

## 2012-11-17 NOTE — Patient Instructions (Addendum)
HEALTH MAINTENANCE RECOMMENDATIONS:  It is recommended that you get at least 30 minutes of aerobic exercise at least 5 days/week (for weight loss, you may need as much as 60-90 minutes). This can be any activity that gets your heart rate up. This can be divided in 10-15 minute intervals if needed, but try and build up your endurance at least once a week.  Weight bearing exercise is also recommended twice weekly.  Eat a healthy diet with lots of vegetables, fruits and fiber.  "Colorful" foods have a lot of vitamins (ie green vegetables, tomatoes, red peppers, etc).  Limit sweet tea, regular sodas and alcoholic beverages, all of which has a lot of calories and sugar.  Up to 2 alcoholic drinks daily may be beneficial for men (unless trying to lose weight, watch sugars).  Drink a lot of water.  Sunscreen of at least SPF 30 should be used on all sun-exposed parts of the skin when outside between the hours of 10 am and 4 pm (not just when at beach or pool, but even with exercise, golf, tennis, and yard work!)  Use a sunscreen that says "broad spectrum" so it covers both UVA and UVB rays, and make sure to reapply every 1-2 hours.  Remember to change the batteries in your smoke detectors when changing your clock times in the spring and fall.  Use your seat belt every time you are in a car, and please drive safely and not be distracted with cell phones and texting while driving.  Pneumonia shot is recommended.  You declined today--feel free to return as a nurse visit in January (when you're 66) rather than waiting a full year to get.  Shingles vaccine is recommended--you need to get from a pharmacy (usually [facility] has them).  Please call us if you get the vaccine, with the date, so we can put it in the computer.  You should wait a month after any other vaccines (ie flu shot today) before getting the shingles vaccine.  I am not sure which inhaled steroids are available as generic--please check with your  pharmacy, and let me know if there is a preferred one based on cost and your insurance formulary, and I'm happy to change from the Flovent.

## 2012-11-18 LAB — LIPID PANEL
HDL: 45 mg/dL (ref >39)
LDL Cholesterol: 155 mg/dL — ABNORMAL HIGH (ref 0–99)
Total CHOL/HDL Ratio: 5 Ratio
Triglycerides: 115 mg/dL (ref <150)

## 2012-11-18 LAB — GLUCOSE, RANDOM: Glucose, Bld: 100 mg/dL — ABNORMAL HIGH (ref 70–99)

## 2012-11-20 ENCOUNTER — Other Ambulatory Visit: Payer: Self-pay | Admitting: Family Medicine

## 2012-11-22 ENCOUNTER — Other Ambulatory Visit: Payer: Self-pay | Admitting: *Deleted

## 2012-11-22 DIAGNOSIS — Z79899 Other long term (current) drug therapy: Secondary | ICD-10-CM

## 2012-11-22 DIAGNOSIS — E785 Hyperlipidemia, unspecified: Secondary | ICD-10-CM

## 2013-01-31 ENCOUNTER — Telehealth: Payer: Self-pay | Admitting: Family Medicine

## 2013-01-31 DIAGNOSIS — J309 Allergic rhinitis, unspecified: Secondary | ICD-10-CM

## 2013-01-31 MED ORDER — LEVOCETIRIZINE DIHYDROCHLORIDE 5 MG PO TABS: 5.0000 mg | ORAL_TABLET | Freq: Every day | ORAL | Status: DC

## 2013-01-31 NOTE — Telephone Encounter (Signed)
Pt called requesting a refill on xyzal to a new pharmacy. xyzal 5 mg new pharm is [facility]

## 2013-01-31 NOTE — Telephone Encounter (Signed)
done

## 2013-02-22 ENCOUNTER — Telehealth: Payer: Self-pay | Admitting: Family Medicine

## 2013-02-22 ENCOUNTER — Ambulatory Visit (INDEPENDENT_AMBULATORY_CARE_PROVIDER_SITE_OTHER): Payer: Medicare Other | Admitting: Medical

## 2013-02-22 ENCOUNTER — Encounter: Payer: Self-pay | Admitting: Medical

## 2013-02-22 VITALS — BP 120/70 | HR 72 | Temp 98.0°F | Resp 16 | Wt 223.0 lb

## 2013-02-22 DIAGNOSIS — R82998 Other abnormal findings in urine: Secondary | ICD-10-CM

## 2013-02-22 DIAGNOSIS — N419 Inflammatory disease of prostate, unspecified: Secondary | ICD-10-CM

## 2013-02-22 LAB — COMPREHENSIVE METABOLIC PANEL
ALT: 20 U/L (ref 0–53)
AST: 23 U/L (ref 0–37)
Albumin: 4.2 g/dL (ref 3.5–5.2)
Alkaline Phosphatase: 53 U/L (ref 39–117)
BUN: 14 mg/dL (ref 6–23)
CO2: 29 mEq/L (ref 19–32)
Calcium: 9.4 mg/dL (ref 8.4–10.5)
Chloride: 106 mEq/L (ref 96–112)
Creat: 0.96 mg/dL (ref 0.50–1.35)
Glucose, Bld: 116 mg/dL — ABNORMAL HIGH (ref 70–99)
Potassium: 4.6 mEq/L (ref 3.5–5.3)
Sodium: 141 mEq/L (ref 135–145)
Total Bilirubin: 0.4 mg/dL (ref 0.3–1.2)
Total Protein: 6.2 g/dL (ref 6.0–8.3)

## 2013-02-22 LAB — POCT URINALYSIS DIPSTICK
Ketones, UA: NEGATIVE
Spec Grav, UA: 1.02
Urobilinogen, UA: NEGATIVE

## 2013-02-22 MED ORDER — FLUTICASONE PROPIONATE HFA 110 MCG/ACT IN AERO: 1.0000 | INHALATION_SPRAY | Freq: Two times a day (BID) | RESPIRATORY_TRACT | Status: DC

## 2013-02-22 MED ORDER — CIPROFLOXACIN HCL 500 MG PO TABS: 500.0000 mg | ORAL_TABLET | Freq: Two times a day (BID) | ORAL | Status: DC

## 2013-02-22 NOTE — Telephone Encounter (Signed)
Done

## 2013-02-22 NOTE — Progress Notes (Signed)
Subjective:  [name] is a 67 y.o. male who presents with possible prostatitis.  He notes hx/o prostatitis in the past with minimal symptoms.   He notes the last 2 days with low back pain, mild amount of faint blood in urine, urinary pressure, urine cloudy.  This was the same symptoms in the past with prostatitis.  He also notes hx/o UTI with burning and frequency, but denies those symptoms now.   Hx/o renal stones in the past.  Denies fever, odorous urine, perineal or scrotal pain, no GI c/o.  No other aggravating or relieving factors.    No other c/o.  The following portions of the patient's history were reviewed and updated as appropriate: allergies, current medications, past family history, past medical history, past social history, past surgical history and problem list.  ROS Otherwise as in subjective above  Objective: Physical Exam  Vital signs reviewed  General appearance: alert, no distress, WD/WN Lungs: CTA, no rhonchi, rales, bronchi Heart: RRR, normal S1, S2, no murmurs Abdomen: +bs, soft, non tender, non distended, no masses, no hepatomegaly, no splenomegaly Back: nontender, no CVA tenderness GU: normal male external genitalia, nontender, no mass Prostate enlarge, but not boggy, no nodules, no warmth or tenderness  Assessment: Encounter Diagnoses  Name Primary?  . Hematuria Yes  . Prostatitis   . Abnormal urinalysis   . Unspecified asthma     Plan: Discussed symptoms, diagnosis, prior similar with prostatitis.   Prostatitis seems likely diagnosis today.   Begin Cipro, urine sent for culture, CMET given symptoms and almost brown appearing urine.    Asthma - refilled Flovent.  Doing well with current asthma control.  Follow up: pending labs

## 2013-02-23 LAB — URINE CULTURE
Colony Count: NO GROWTH
Organism ID, Bacteria: NO GROWTH

## 2013-03-19 ENCOUNTER — Ambulatory Visit (INDEPENDENT_AMBULATORY_CARE_PROVIDER_SITE_OTHER): Payer: Medicare Other | Admitting: Family Medicine

## 2013-03-19 VITALS — BP 120/75 | HR 64 | Temp 97.7°F | Resp 16 | Ht 75.5 in | Wt 226.0 lb

## 2013-03-19 DIAGNOSIS — H6123 Impacted cerumen, bilateral: Secondary | ICD-10-CM

## 2013-03-19 DIAGNOSIS — H9209 Otalgia, unspecified ear: Secondary | ICD-10-CM

## 2013-03-19 DIAGNOSIS — H9201 Otalgia, right ear: Secondary | ICD-10-CM

## 2013-03-19 DIAGNOSIS — S00431A Contusion of right ear, initial encounter: Secondary | ICD-10-CM

## 2013-03-19 DIAGNOSIS — S1093XA Contusion of unspecified part of neck, initial encounter: Secondary | ICD-10-CM

## 2013-03-19 DIAGNOSIS — H6120 Impacted cerumen, unspecified ear: Secondary | ICD-10-CM

## 2013-03-19 DIAGNOSIS — S0003XA Contusion of scalp, initial encounter: Secondary | ICD-10-CM

## 2013-03-19 NOTE — Patient Instructions (Addendum)
Otitis Externa Otitis externa is a bacterial or fungal infection of the outer ear canal. This is the area from the eardrum to the outside of the ear. Otitis externa is sometimes called "swimmer's ear." CAUSES  Possible causes of infection include:  Swimming in dirty water.  Moisture remaining in the ear after swimming or bathing.  Mild injury (trauma) to the ear.  Objects stuck in the ear (foreign body).  Cuts or scrapes (abrasions) on the outside of the ear. SYMPTOMS  The first symptom of infection is often itching in the ear canal. Later signs and symptoms may include swelling and redness of the ear canal, ear pain, and yellowish-white fluid (pus) coming from the ear. The ear pain may be worse when pulling on the earlobe. DIAGNOSIS  Your caregiver will perform a physical exam. A sample of fluid may be taken from the ear and examined for bacteria or fungi. TREATMENT  Antibiotic ear drops are often given for 10 to 14 days. Treatment may also include pain medicine or corticosteroids to reduce itching and swelling. PREVENTION   Keep your ear dry. Use the corner of a towel to absorb water out of the ear canal after swimming or bathing.  Avoid scratching or putting objects inside your ear. This can damage the ear canal or remove the protective wax that lines the canal. This makes it easier for bacteria and fungi to grow.  Avoid swimming in lakes, polluted water, or poorly chlorinated pools.  You may use ear drops made of rubbing alcohol and vinegar after swimming. Combine equal parts of white vinegar and alcohol in a bottle. Put 3 or 4 drops into each ear after swimming. HOME CARE INSTRUCTIONS   Apply antibiotic ear drops to the ear canal as prescribed by your caregiver.  Only take over-the-counter or prescription medicines for pain, discomfort, or fever as directed by your caregiver.  If you have diabetes, follow any additional treatment instructions from your caregiver.  Keep all  follow-up appointments as directed by your caregiver. SEEK MEDICAL CARE IF:   You have a fever.  Your ear is still red, swollen, painful, or draining pus after 3 days.  Your redness, swelling, or pain gets worse.  You have a severe headache.  You have redness, swelling, pain, or tenderness in the area behind your ear. MAKE SURE YOU:   Understand these instructions.  Will watch your condition.  Will get help right away if you are not doing well or get worse. Document Released: 12/08/2005 Document Revised: 03/01/2012 Document Reviewed: 12/25/2011 [employer] Patient Information 2013 [employer].    Use eardrops as directed.  Use the Debrox ear drops about every 4-6 months to try and keep from getting the wax buildup.  

## 2013-03-19 NOTE — Progress Notes (Signed)
Subjective: Patient complains of discomfort in his right ear. He tried to use a Q-tip and got even worse. He has a history of cerumen impaction every 3 or 4 years.  Objective: Bilateral cerumen impaction. He has a little blood blister on the wall of his right ear canal. He been using the Q-tip there.  Both ears were irrigated and curetted by me since my assistant was busy.. Tolerated the procedure well. Large plugs of wax removed from both sides.  Assessment: Otalgia Bilateral cerumen impaction, irrigated and curetted Small hematoma right ear canal.  Plan:  Use Debrox periodically in the year to see if she can keep from getting the impactions.  Cautioned him that the hematoma might pop sometime in very little bit, but it was on the ear canal. he gets more problems he should come in for recheck.

## 2013-04-25 ENCOUNTER — Other Ambulatory Visit: Payer: Medicare Other

## 2013-06-30 ENCOUNTER — Other Ambulatory Visit: Payer: Self-pay | Admitting: Urology

## 2013-07-27 ENCOUNTER — Encounter (HOSPITAL_BASED_OUTPATIENT_CLINIC_OR_DEPARTMENT_OTHER): Payer: Self-pay | Admitting: *Deleted

## 2013-07-27 NOTE — Progress Notes (Signed)
NPO AFTER MN.  ARRIVES AT 0930. NEEDS HG. WILL TAKE XYZAL, DO FLOVENT INHALER AND FLONASE SPRAY AM OF SURG W/ SIPS OF WATER.

## 2013-08-02 ENCOUNTER — Encounter (HOSPITAL_BASED_OUTPATIENT_CLINIC_OR_DEPARTMENT_OTHER): Payer: Self-pay | Admitting: *Deleted

## 2013-08-02 NOTE — Progress Notes (Signed)
Patient states with surgery in 10/2013 patient states he woke up when anesthesia was extubating him.

## 2013-08-03 NOTE — H&P (Signed)
ctive Problems Problems  1. 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 Nephrolithiasis 592.0 3. Prostatitis 601.9 4. Proximal Ureteral Stone On The Right 592.1 5. Pyuria 791.9  History of Present Illness     [name] returns today in f/u for his stones.   He was seen in May for some low back pain possibly on the left.   The symptoms abated prior to being seen and the film was felt to show that a stone had passed but he had a stable RLP stone.  He has no symptoms now.   He had a KUB today and there maybe a stone that is now in the proximal ureter that is 12.1mm.   I am not sure whether the RLP stone is still present.   He has no associated signs or symptoms.   Past Medical History Problems  1. History of  Anxiety (Symptom) 300.00 2. History of  Diffuse Abdominal Pain 789.07 3. History of  Distal Ureteral Stone On The Left 592.1 4. History of  Gross Hematuria 599.71 5. History of  Gross Hematuria 599.71 6. History of  Hematuria 599.70 7. History of  Nephrolithiasis Of The Left Kidney V13.01 8. History of  Prostatitis 601.9 9. History of  Proximal Ureteral Stone On The Right 592.1  Surgical History Problems  1. History of  Appendectomy 2. History of  Cholecystectomy 3. History of  Cystoscopy With Insertion Of Ureteral Stent Right 4. History of  Cystoscopy With Pyeloscopy With Lithotripsy 5. History of  Cystoscopy With Ureteroscopy With Lithotripsy 6. History of  Elbow Arthroplasty 7. History of  Knee Arthroplasty 8. History of  Lithotripsy 9. History of  Osteotomy Of The Calcaneus 10. History of  Rotator Cuff Repair 11. History of  Sinus Surgery 12. History of  Tonsillectomy 13. History of  Total Disc Arthroplasty Cervical 14. History of  Total Hip Replacement 15. History of  Total Hip Replacement Left 16. History of  Total Hip Replacement Left  Current Meds 1. ALPRAZolam 0.5 MG Oral Tablet; Therapy: 12Jan2012 to 2. Flonase SUSP; Therapy: (Recorded:24Mar2014) to 3. Flovent HFA 110  MCG/ACT Inhalation Aerosol; Therapy: 30Mar2012 to 4. Multi-Vitamin TABS; Therapy: (Recorded:16Jan2009) to 5. PARoxetine HCl 40 MG Oral Tablet; Therapy: 11Feb2013 to 6. Xyzal TABS; Therapy: (Recorded:11May2012) to  Allergies Medication  1. Relafen TABS  Family History Problems  1. Fraternal history of  Cirrhosis 2. Paternal history of  Colon Cancer V16.0 3. Family history of  Father Deceased At Age ____ Age 71 from colon cancer. 4. Paternal history of  Mother Deceased At Age ____ Age 36 from uterine cancer 5. Paternal history of  Uterine Cancer V16.49  Social History Problems  1. Marital History - Divorced V61.0 2. Never A Smoker 3. Occupation: retired from [employer]  4. History of  Alcohol Use 5. History of  Caffeine Use 6. History of  Former Smoker  Review of Systems Genitourinary, constitutional, skin, eye, otolaryngeal, hematologic/lymphatic, cardiovascular, pulmonary, endocrine, musculoskeletal, gastrointestinal, neurological and psychiatric system(s) were reviewed and pertinent findings if present are noted.  Cardiovascular: no chest pain.  Respiratory: no shortness of breath.    Vitals Vital Signs [Data Includes: Last 1 Day]  09Jul2014 03:41PM  Blood Pressure: 109 / 65 Temperature: 97.4 F Heart Rate: 62  Physical Exam Constitutional: Well nourished and well developed . No acute distress.  Pulmonary: No respiratory distress and normal respiratory rhythm and effort.  Cardiovascular: Heart rate and rhythm are normal . No peripheral edema.  Abdomen: The abdomen is soft and nontender. No  CVA tenderness.    Results/Data Urine [Data Includes: Last 1 Day]   09Jul2014  COLOR YELLOW   APPEARANCE CLEAR   SPECIFIC GRAVITY 1.020   pH 6.0   GLUCOSE NEG mg/dL  BILIRUBIN NEG   KETONE NEG mg/dL  BLOOD NEG   PROTEIN NEG mg/dL  UROBILINOGEN 0.2 mg/dL  NITRITE NEG   LEUKOCYTE ESTERASE TRACE   SQUAMOUS EPITHELIAL/HPF NONE SEEN   WBC 3-6 WBC/hpf  RBC NONE SEEN  RBC/hpf  BACTERIA RARE   CRYSTALS NONE SEEN   CASTS NONE SEEN    The following images/tracing/specimen were independently visualized:  KUB today shows a possible 12x76mm left proximal stone and I am not sure if the RLP stone is still present. There is a 7mm shadow but it could be bowel content. The film is otherwise unremarkable. CT stone study shows a 9mm in cross section right UPJ stone with a 7mm RLP stone. There is moderate hydro. A full report is pending.  The following clinical lab reports were reviewed:  UA has mild pyuria.    Assessment Assessed  1. Nephrolithiasis 592.0 2. Pyuria 791.9 3. Proximal Ureteral Stone On The Right 592.1   He has a 9 x 12 mm right UPJ stone with obstruction and a 7mm RLP stone.   He has mild pyuria.   Plan Health Maintenance (V70.0)  1. UA With REFLEX  Done: 09Jul2014 02:30PM Nephrolithiasis (592.0)  2. AU CT-STONE PROTOCOL  Done: 09Jul2014 12:00AM Proximal Ureteral Stone On The Left (592.1)  3. Follow-up Schedule Surgery Office  Follow-up  Requested for: 09Jul2014 Pyuria (791.9)  4. URINE CULTURE  Requested for: 09Jul2014   I discussed the options including ESWL which he doesn't want to do based on prior experience along with PCNL and Ureteroscopy.   He had done well with ureteroscopy for a similar stone 2 years ago and with the exception of immediate post op pain tolerated that well.   I will set him up for ureteroscopy and reviewed the risks of bleeding, infection, ureteral injury, secondary procedures, need for stent, thrombotic events and anesthetic complications.

## 2013-08-04 ENCOUNTER — Encounter (HOSPITAL_COMMUNITY): Payer: Self-pay | Admitting: Anesthesiology

## 2013-08-04 ENCOUNTER — Ambulatory Visit (HOSPITAL_BASED_OUTPATIENT_CLINIC_OR_DEPARTMENT_OTHER)
Admission: RE | Admit: 2013-08-04 | Discharge: 2013-08-04 | Disposition: A | Discharge status: Home / Self Care | Payer: Medicare Other | Source: Ambulatory Visit | Attending: Urology | Admitting: Urology

## 2013-08-04 ENCOUNTER — Ambulatory Visit (HOSPITAL_COMMUNITY): Payer: Medicare Other | Admitting: Anesthesiology

## 2013-08-04 ENCOUNTER — Encounter (HOSPITAL_COMMUNITY): Payer: Self-pay

## 2013-08-04 ENCOUNTER — Encounter (HOSPITAL_COMMUNITY)
Admission: RE | Disposition: A | Discharge status: Home / Self Care | Payer: Self-pay | Source: Ambulatory Visit | Attending: Urology

## 2013-08-04 DIAGNOSIS — N419 Inflammatory disease of prostate, unspecified: Secondary | ICD-10-CM | POA: Insufficient documentation

## 2013-08-04 DIAGNOSIS — R82998 Other abnormal findings in urine: Secondary | ICD-10-CM | POA: Insufficient documentation

## 2013-08-04 DIAGNOSIS — Z96649 Presence of unspecified artificial hip joint: Secondary | ICD-10-CM | POA: Insufficient documentation

## 2013-08-04 DIAGNOSIS — N201 Calculus of ureter: Secondary | ICD-10-CM | POA: Diagnosis present

## 2013-08-04 DIAGNOSIS — N320 Bladder-neck obstruction: Secondary | ICD-10-CM | POA: Insufficient documentation

## 2013-08-04 DIAGNOSIS — N200 Calculus of kidney: Secondary | ICD-10-CM | POA: Diagnosis present

## 2013-08-04 DIAGNOSIS — Z79899 Other long term (current) drug therapy: Secondary | ICD-10-CM | POA: Insufficient documentation

## 2013-08-04 HISTORY — DX: Unspecified asthma, uncomplicated: J45.909

## 2013-08-04 HISTORY — DX: Personal history of urinary calculi: Z87.442

## 2013-08-04 HISTORY — DX: Unspecified osteoarthritis, unspecified site: M19.90

## 2013-08-04 HISTORY — DX: Nausea with vomiting, unspecified: R11.2

## 2013-08-04 HISTORY — DX: Personal history of other diseases of the digestive system: Z87.19

## 2013-08-04 HISTORY — DX: Other chronic pain: G89.29

## 2013-08-04 HISTORY — DX: Dorsalgia, unspecified: M54.9

## 2013-08-04 HISTORY — DX: Other specified postprocedural states: Z98.890

## 2013-08-04 HISTORY — PX: CYSTOSCOPY WITH RETROGRADE PYELOGRAM, URETEROSCOPY AND STENT PLACEMENT: SHX5789

## 2013-08-04 HISTORY — DX: Calculus of kidney: N20.0

## 2013-08-04 HISTORY — DX: Calculus of ureter: N20.1

## 2013-08-04 HISTORY — DX: Alcohol abuse, in remission: F10.11

## 2013-08-04 LAB — CBC
HCT: 37.6 % — ABNORMAL LOW (ref 39.0–52.0)
Hemoglobin: 13 g/dL (ref 13.0–17.0)
MCH: 32.3 pg (ref 26.0–34.0)
MCHC: 34.6 g/dL (ref 30.0–36.0)

## 2013-08-04 SURGERY — CYSTOURETEROSCOPY, WITH RETROGRADE PYELOGRAM AND STENT INSERTION
Anesthesia: General | Laterality: Right | Wound class: Clean Contaminated

## 2013-08-04 SURGERY — CYSTOURETEROSCOPY, WITH STENT INSERTION
Anesthesia: Choice | Laterality: Right

## 2013-08-04 MED ORDER — CIPROFLOXACIN IN D5W 400 MG/200ML IV SOLN
INTRAVENOUS | Status: AC
  Filled 2013-08-04: qty 200

## 2013-08-04 MED ORDER — FENTANYL CITRATE 0.05 MG/ML IJ SOLN
INTRAMUSCULAR | Status: AC
  Filled 2013-08-04: qty 2

## 2013-08-04 MED ORDER — ONDANSETRON HCL 4 MG/2ML IJ SOLN
INTRAMUSCULAR | Status: DC | PRN
  Administered 2013-08-04: 4 mg via INTRAVENOUS

## 2013-08-04 MED ORDER — FENTANYL CITRATE 0.05 MG/ML IJ SOLN
INTRAMUSCULAR | Status: DC | PRN
  Administered 2013-08-04 (×2): 50 ug via INTRAVENOUS

## 2013-08-04 MED ORDER — KETOROLAC TROMETHAMINE 30 MG/ML IJ SOLN: 15.0000 mg | Freq: Once | INTRAMUSCULAR | Status: DC | PRN

## 2013-08-04 MED ORDER — SODIUM CHLORIDE 0.9 % IJ SOLN: 3.0000 mL | Freq: Two times a day (BID) | INTRAMUSCULAR | Status: DC

## 2013-08-04 MED ORDER — GLYCOPYRROLATE 0.2 MG/ML IJ SOLN
INTRAMUSCULAR | Status: DC | PRN
  Administered 2013-08-04: 0.2 mg via INTRAVENOUS

## 2013-08-04 MED ORDER — PROMETHAZINE HCL 25 MG/ML IJ SOLN: 6.2500 mg | INTRAMUSCULAR | Status: DC | PRN

## 2013-08-04 MED ORDER — FENTANYL CITRATE 0.05 MG/ML IJ SOLN: 25.0000 ug | INTRAMUSCULAR | Status: DC | PRN

## 2013-08-04 MED ORDER — BELLADONNA ALKALOIDS-OPIUM 16.2-60 MG RE SUPP
RECTAL | Status: DC | PRN
  Administered 2013-08-04: 1 via RECTAL

## 2013-08-04 MED ORDER — ONDANSETRON HCL 4 MG/2ML IJ SOLN: 4.0000 mg | Freq: Four times a day (QID) | INTRAMUSCULAR | Status: DC | PRN

## 2013-08-04 MED ORDER — PHENAZOPYRIDINE HCL 200 MG PO TABS: 200.0000 mg | ORAL_TABLET | Freq: Three times a day (TID) | ORAL | Status: DC | PRN

## 2013-08-04 MED ORDER — DEXAMETHASONE SODIUM PHOSPHATE 10 MG/ML IJ SOLN
INTRAMUSCULAR | Status: DC | PRN
  Administered 2013-08-04: 10 mg via INTRAVENOUS

## 2013-08-04 MED ORDER — ACETAMINOPHEN 325 MG PO TABS: 650.0000 mg | ORAL_TABLET | ORAL | Status: DC | PRN

## 2013-08-04 MED ORDER — BELLADONNA ALKALOIDS-OPIUM 16.2-60 MG RE SUPP
RECTAL | Status: AC
  Filled 2013-08-04: qty 1

## 2013-08-04 MED ORDER — HYOSCYAMINE SULFATE 0.125 MG SL SUBL: 0.1250 mg | SUBLINGUAL_TABLET | SUBLINGUAL | Status: DC | PRN

## 2013-08-04 MED ORDER — SODIUM CHLORIDE 0.9 % IV SOLN: 250.0000 mL | INTRAVENOUS | Status: DC | PRN

## 2013-08-04 MED ORDER — LIDOCAINE HCL 2 % EX GEL
CUTANEOUS | Status: AC
  Filled 2013-08-04: qty 10

## 2013-08-04 MED ORDER — LIDOCAINE HCL (CARDIAC) 20 MG/ML IV SOLN
INTRAVENOUS | Status: DC | PRN
  Administered 2013-08-04: 100 mg via INTRAVENOUS

## 2013-08-04 MED ORDER — MIDAZOLAM HCL 5 MG/5ML IJ SOLN
INTRAMUSCULAR | Status: DC | PRN
  Administered 2013-08-04: 2 mg via INTRAVENOUS

## 2013-08-04 MED ORDER — LACTATED RINGERS IV SOLN
INTRAVENOUS | Status: DC
  Administered 2013-08-04: 14:00:00 via INTRAVENOUS

## 2013-08-04 MED ORDER — ATROPINE SULFATE 0.4 MG/ML IJ SOLN
INTRAMUSCULAR | Status: DC | PRN
  Administered 2013-08-04: 0.2 mg via INTRAVENOUS

## 2013-08-04 MED ORDER — IOHEXOL 300 MG/ML  SOLN
INTRAMUSCULAR | Status: AC
  Filled 2013-08-04: qty 1

## 2013-08-04 MED ORDER — OXYCODONE HCL 5 MG PO TABS: 5.0000 mg | ORAL_TABLET | ORAL | Status: DC | PRN

## 2013-08-04 MED ORDER — FENTANYL CITRATE 0.05 MG/ML IJ SOLN
25.0000 ug | INTRAMUSCULAR | Status: DC | PRN
  Administered 2013-08-04 (×3): 50 ug via INTRAVENOUS

## 2013-08-04 MED ORDER — HYDROMORPHONE HCL 2 MG PO TABS: 2.0000 mg | ORAL_TABLET | ORAL | Status: DC | PRN

## 2013-08-04 MED ORDER — LIDOCAINE HCL 2 % EX GEL
CUTANEOUS | Status: DC | PRN
  Administered 2013-08-04: 1 via URETHRAL

## 2013-08-04 MED ORDER — LACTATED RINGERS IV SOLN
INTRAVENOUS | Status: DC
  Administered 2013-08-04: 1000 mL via INTRAVENOUS

## 2013-08-04 MED ORDER — SODIUM CHLORIDE 0.9 % IJ SOLN: 3.0000 mL | INTRAMUSCULAR | Status: DC | PRN

## 2013-08-04 MED ORDER — CIPROFLOXACIN IN D5W 400 MG/200ML IV SOLN
400.0000 mg | INTRAVENOUS | Status: AC
  Administered 2013-08-04: 400 mg via INTRAVENOUS

## 2013-08-04 MED ORDER — PROPOFOL 10 MG/ML IV BOLUS
INTRAVENOUS | Status: DC | PRN
  Administered 2013-08-04: 200 mg via INTRAVENOUS

## 2013-08-04 MED ORDER — ACETAMINOPHEN 650 MG RE SUPP
650.0000 mg | RECTAL | Status: DC | PRN
  Filled 2013-08-04: qty 1

## 2013-08-04 SURGICAL SUPPLY — 14 items
BAG URO CATCHER STRL LF (DRAPE) ×2 IMPLANT
BASKET ZERO TIP NITINOL 2.4FR (BASKET) ×2 IMPLANT
CATH URET 5FR 28IN OPEN ENDED (CATHETERS) ×2 IMPLANT
CLOTH BEACON ORANGE TIMEOUT ST (SAFETY) ×2 IMPLANT
DRAPE CAMERA CLOSED 9X96 (DRAPES) ×2 IMPLANT
GLOVE SURG SS PI 8.0 STRL IVOR (GLOVE) ×2 IMPLANT
GOWN STRL REIN XL XLG (GOWN DISPOSABLE) ×2 IMPLANT
GUIDEWIRE STR DUAL SENSOR (WIRE) ×2 IMPLANT
LASER FIBER DISP (UROLOGICAL SUPPLIES) ×2 IMPLANT
MANIFOLD NEPTUNE II (INSTRUMENTS) ×2 IMPLANT
PACK CYSTO (CUSTOM PROCEDURE TRAY) ×2 IMPLANT
SHEATH ACCESS URETERAL 54CM (SHEATH) ×2 IMPLANT
STENT CONTOUR 6FRX28X.038 (STENTS) ×2 IMPLANT
TUBING CONNECTING 10 (TUBING) ×2 IMPLANT

## 2013-08-04 NOTE — Preoperative (Signed)
Beta Blockers   Reason not to administer Beta Blockers:Not Applicable 

## 2013-08-04 NOTE — Anesthesia Preprocedure Evaluation (Addendum)
Anesthesia Evaluation  Patient identified by MRN, date of birth, ID band Patient awake    Reviewed: Allergy & Precautions, H&P , NPO status , Patient's Chart, lab work & pertinent test results  Airway Mallampati: III TM Distance: <3 FB Neck ROM: Full    Dental  (+) Caps   Pulmonary asthma ,  breath sounds clear to auscultation  Pulmonary exam normal       Cardiovascular negative cardio ROS  Rhythm:Regular Rate:Normal     Neuro/Psych Anxiety negative neurological ROS     GI/Hepatic negative GI ROS, Neg liver ROS,   Endo/Other  negative endocrine ROS  Renal/GU negative Renal ROS  negative genitourinary   Musculoskeletal negative musculoskeletal ROS (+)   Abdominal   Peds negative pediatric ROS (+)  Hematology negative hematology ROS (+)   Anesthesia Other Findings   Reproductive/Obstetrics negative OB ROS                          Anesthesia Physical Anesthesia Plan  ASA: II  Anesthesia Plan: General   Post-op Pain Management:    Induction: Intravenous  Airway Management Planned: LMA  Additional Equipment:   Intra-op Plan:   Post-operative Plan:   Informed Consent: I have reviewed the patients History and Physical, chart, labs and discussed the procedure including the risks, benefits and alternatives for the proposed anesthesia with the patient or authorized representative who has indicated his/her understanding and acceptance.   Dental advisory given  Plan Discussed with: CRNA and Surgeon  Anesthesia Plan Comments:         Anesthesia Quick Evaluation

## 2013-08-04 NOTE — Transfer of Care (Signed)
Immediate Anesthesia Transfer of Care Note  Patient: [name]  Procedure(s) Performed: Procedure(s): , STONE BASKETRY AND STENT PLACEMENT (Right)  Patient Location: PACU  Anesthesia Type:General  Level of Consciousness: sedated  Airway & Oxygen Therapy: Patient Spontanous Breathing and Patient connected to face mask oxygen  Post-op Assessment: Report given to PACU RN and Post -op Vital signs reviewed and stable  Post vital signs: Reviewed and stable  Complications: No apparent anesthesia complications

## 2013-08-04 NOTE — Brief Op Note (Signed)
08/04/2013  1:01 PM  PATIENT:  [name]  67 y.o. male  PRE-OPERATIVE DIAGNOSIS:  Right UPJ Stone/Right Lower Pole Stone  POST-OPERATIVE DIAGNOSIS:  right upj stone, right lower pole stone  PROCEDURE:  Procedure(s): Cystoscopy with right RTG pyelogram, ureteroscopy with lasertripsy, STONE BASKETRY AND STENT PLACEMENT (Right)  SURGEON:  Surgeon(s) and Role:    * Anner Crete, MD - Primary  PHYSICIAN ASSISTANT:   ASSISTANTS: none   ANESTHESIA:   general  EBL:  Total I/O In: 800 [I.V.:800] Out: -   BLOOD ADMINISTERED:none  DRAINS: 6x28cm right JJ stent   LOCAL MEDICATIONS USED:  LIDOCAINE   SPECIMEN:  Source of Specimen:  ureteral and renal stone fragments  DISPOSITION OF SPECIMEN:  to family  COUNTS:  YES  TOURNIQUET:  * No tourniquets in log *  DICTATION: .Other Dictation: Dictation Number [id number]  PLAN OF CARE: Discharge to home after PACU  PATIENT DISPOSITION:  PACU - hemodynamically stable.   Delay start of Pharmacological VTE agent (>24hrs) due to surgical blood loss or risk of bleeding: not applicable

## 2013-08-04 NOTE — Anesthesia Postprocedure Evaluation (Signed)
  Anesthesia Post-op Note  Patient: [name]  Procedure(s) Performed: Procedure(s) (LRB): , STONE BASKETRY AND STENT PLACEMENT (Right)  Patient Location: PACU  Anesthesia Type: General  Level of Consciousness: awake and alert   Airway and Oxygen Therapy: Patient Spontanous Breathing  Post-op Pain: mild  Post-op Assessment: Post-op Vital signs reviewed, Patient's Cardiovascular Status Stable, Respiratory Function Stable, Patent Airway and No signs of Nausea or vomiting  Last Vitals:  Filed Vitals:   08/04/13 0832  BP: 153/81  Pulse: 57  Temp: 36.6 C  Resp: 18    Post-op Vital Signs: stable   Complications: No apparent anesthesia complications

## 2013-08-04 NOTE — Interval H&P Note (Signed)
History and Physical Interval Note:  08/04/2013 11:19 AM  [name]  has presented today for surgery, with the diagnosis of Right UPJ Stone/Right Lower Pole Stone  The various methods of treatment have been discussed with the patient and family. After consideration of risks, benefits and other options for treatment, the patient has consented to  Procedure(s): CYSTOSCOPY WITH RIGHT URETEROSCOPY WITH LASER AND POSSIBLE STENT PLACEMENT (Right) as a surgical intervention .  The patient's history has been reviewed, patient examined, no change in status, stable for surgery.  I have reviewed the patient's chart and labs.  Questions were answered to the patient's satisfaction.     [name] J

## 2013-08-05 ENCOUNTER — Encounter (HOSPITAL_COMMUNITY): Payer: Self-pay | Admitting: Urology

## 2013-08-05 NOTE — Op Note (Signed)
NAME:  [name], [name]           ACCOUNT NO.:  [id number]  MEDICAL RECORD NO.:  [id number]  LOCATION:  [facility]                         FACILITY:  [facility]  PHYSICIAN:  Excell Seltzer. Annabell Howells, M.D.    DATE OF BIRTH:  1945/12/26  DATE OF PROCEDURE:  08/04/2013 DATE OF DISCHARGE:  08/04/2013                              OPERATIVE REPORT   PROCEDURE:  Cystoscopy, right retrograde pyelogram with interpretation, right ureteroscopy with holmium laser lithotripsy of right proximal ureteral and right lower pole stones, insertion of right double-J stent.  PREOPERATIVE DIAGNOSES:  Right proximal ureter and right lower pole stones.  SURGEON:  Excell Seltzer. Annabell Howells, M.D.  ANESTHESIA:  General.  SPECIMENS:  Stone fragments.  DRAINS:  A 6-French 28 cm, right double-J stent.  COMPLICATIONS:  None.  INDICATIONS:  [name] is a 67 year old white male with a history of stones.  He has a 9 mm right proximal stone and a 7 mm right lower pole stone.  He has elected ureteroscopy for treatment.  FINDINGS OF PROCEDURE:  He was taken to the operating room, where general anesthetic was induced.  He was given Cipro.  He was placed in lithotomy position and fitted with PAS hose.  B and O suppository was placed.  His perineum and genitalia were prepped with Betadine solution and he was draped in usual sterile fashion.  Cystoscopy was performed using a 22-French scope and 12-degree lens. Examination revealed a normal urethra.  The external sphincter was intact.  The prostatic urethra was approximately 2 cm in length with mild bilobar hyperplasia with minimal obstruction.  Examination of the bladder revealed mild trabeculation.  No tumors, stones, or inflammation were noted.  Ureteral orifices were unremarkable.  The right ureteral orifice was cannulated with an 5-French open-end catheter and contrast was instilled.  Right retrograde pyelogram revealed a normal ureter up to the level of the stone, where there is an  ovoid filling defect just below the UPJ consistent with a stone.  No strictures or filling defects were noted.  The open-end catheter was cannulated with a Sensor guidewire which was then passed to the kidney.  The stone appeared to bounce back into the kidney with wire passage.  At this point, the bladder was drained and the cystoscope was removed. A 55 cm digital access sheath was placed on the field and the inner core was used to dilate the ureter just below to the level of the stone and at this point, it did appear the stone bounced back into the kidney. The access sheath was then  reassembled and reinserted over the wire to the level of the stone and the inner core and wire were removed.  The digital ureteroscope was then passed and the kidney was inspected. There was some edema at the area, where the stone had been impacted, but no other ureteral injury.  The stone was eventually localized in the mid pole of the kidney.  The intrarenal collecting system did appeared to be dilated as if it had been chronically obstructed.  There was also a stone in the lower pole which on inspection had only a small portion visible above the mucosa.  Of 200 micron laser fiber was then  passed and the primary stone in the mid pole was then engaged at 0.5 watts and 10 hertz.  The stone broke up very readily and to very fine fragments.  A Nitinol basket was then passed and a few stone fragments were removed, but the bulk were too small to engage the basket so once all the fragments of any significant size were felt to have been removed, my attention turned to the lower pole stone as the remaining fragments should be small enough to pass.  The lower pole of the stone was then engaged with the laser and broken into small fragments.  I did have to unroof the mucosa to treat the entire stone, but eventually I was able to tease it from the pocket at the caliceal tip and fragmented successfully.  The  basket once again was used to remove some of the larger fragments of this stone, but most of the fragments were too small to retrieve.  At this point, a guidewire was passed through the ureteroscope to the kidney.  The scope was backed out over the wire and removed and the sheath.  No significant ureteral injury was noted.  The cystoscope was reinserted over the wire and a 6-French 28 cm double- J stent was passed to the kidney under fluoroscopic guidance.  The wire was removed leaving a good coil in the kidney and a good coil in the bladder.  The bladder was drained and the cystoscope was removed leaving the stent string exiting urethra.  At this point,10 mL of 2% lidocaine jelly were instilled per urethra and this was held in place with 4 x 4 gentle tourniquet and the tethering string was then subsequently secured to the penis.  The patient's drapes removed.  He was taken down from lithotomy position.  The gauze removed from the penis.  Upon transfer, he was then taken to recovery room in stable condition after reversal of his anesthetic.     Excell Seltzer. Annabell Howells, M.D.     JJW/MEDQ  D:  08/04/2013  T:  08/04/2013  Job:  [id number]

## 2013-09-22 ENCOUNTER — Other Ambulatory Visit: Payer: Self-pay | Admitting: Family Medicine

## 2013-11-23 ENCOUNTER — Other Ambulatory Visit: Payer: Self-pay | Admitting: Family Medicine

## 2013-12-13 ENCOUNTER — Other Ambulatory Visit: Payer: Self-pay

## 2014-01-07 ENCOUNTER — Other Ambulatory Visit: Payer: Self-pay | Admitting: Family Medicine

## 2014-02-02 ENCOUNTER — Other Ambulatory Visit: Payer: Self-pay | Admitting: Family Medicine

## 2014-02-09 ENCOUNTER — Ambulatory Visit (INDEPENDENT_AMBULATORY_CARE_PROVIDER_SITE_OTHER): Payer: Medicare Other | Admitting: Family Medicine

## 2014-02-09 ENCOUNTER — Encounter: Payer: Medicare Other | Admitting: Family Medicine

## 2014-02-09 ENCOUNTER — Encounter: Payer: Self-pay | Admitting: Family Medicine

## 2014-02-09 VITALS — BP 134/84 | HR 64 | Ht 74.25 in | Wt 215.0 lb

## 2014-02-09 DIAGNOSIS — Z79899 Other long term (current) drug therapy: Secondary | ICD-10-CM

## 2014-02-09 DIAGNOSIS — J309 Allergic rhinitis, unspecified: Secondary | ICD-10-CM

## 2014-02-09 DIAGNOSIS — Z125 Encounter for screening for malignant neoplasm of prostate: Secondary | ICD-10-CM

## 2014-02-09 DIAGNOSIS — N200 Calculus of kidney: Secondary | ICD-10-CM

## 2014-02-09 DIAGNOSIS — F410 Panic disorder [episodic paroxysmal anxiety] without agoraphobia: Secondary | ICD-10-CM

## 2014-02-09 DIAGNOSIS — R319 Hematuria, unspecified: Secondary | ICD-10-CM

## 2014-02-09 DIAGNOSIS — J45909 Unspecified asthma, uncomplicated: Secondary | ICD-10-CM

## 2014-02-09 DIAGNOSIS — Z0000 Encounter for general adult medical examination without abnormal findings: Secondary | ICD-10-CM

## 2014-02-09 DIAGNOSIS — E7800 Pure hypercholesterolemia, unspecified: Secondary | ICD-10-CM

## 2014-02-09 LAB — CBC WITH DIFFERENTIAL/PLATELET
BASOS PCT: 1 % (ref 0–1)
Basophils Absolute: 0 10*3/uL (ref 0.0–0.1)
EOS ABS: 0.2 10*3/uL (ref 0.0–0.7)
EOS PCT: 5 % (ref 0–5)
HCT: 42.6 % (ref 39.0–52.0)
Hemoglobin: 14.9 g/dL (ref 13.0–17.0)
LYMPHS ABS: 1.1 10*3/uL (ref 0.7–4.0)
Lymphocytes Relative: 28 % (ref 12–46)
MCH: 33 pg (ref 26.0–34.0)
MCHC: 35 g/dL (ref 30.0–36.0)
MCV: 94.2 fL (ref 78.0–100.0)
MONOS PCT: 8 % (ref 3–12)
Monocytes Absolute: 0.3 10*3/uL (ref 0.1–1.0)
NEUTROS PCT: 58 % (ref 43–77)
Neutro Abs: 2.4 10*3/uL (ref 1.7–7.7)
PLATELETS: 200 10*3/uL (ref 150–400)
RBC: 4.52 MIL/uL (ref 4.22–5.81)
RDW: 14.2 % (ref 11.5–15.5)
WBC: 4.1 10*3/uL (ref 4.0–10.5)

## 2014-02-09 LAB — LIPID PANEL
CHOLESTEROL: 229 mg/dL — AB (ref 0–200)
HDL: 49 mg/dL (ref >39)
LDL CALC: 162 mg/dL — AB (ref 0–99)
TRIGLYCERIDES: 88 mg/dL (ref <150)
Total CHOL/HDL Ratio: 4.7 Ratio
VLDL: 18 mg/dL (ref 0–40)

## 2014-02-09 LAB — COMPREHENSIVE METABOLIC PANEL
ALK PHOS: 59 U/L (ref 39–117)
ALT: 19 U/L (ref 0–53)
AST: 23 U/L (ref 0–37)
Albumin: 4.5 g/dL (ref 3.5–5.2)
BILIRUBIN TOTAL: 0.6 mg/dL (ref 0.2–1.2)
BUN: 11 mg/dL (ref 6–23)
CO2: 28 meq/L (ref 19–32)
CREATININE: 1.02 mg/dL (ref 0.50–1.35)
Calcium: 9.3 mg/dL (ref 8.4–10.5)
Chloride: 105 mEq/L (ref 96–112)
GLUCOSE: 101 mg/dL — AB (ref 70–99)
Potassium: 4.4 mEq/L (ref 3.5–5.3)
Sodium: 143 mEq/L (ref 135–145)
Total Protein: 6.7 g/dL (ref 6.0–8.3)

## 2014-02-09 LAB — POCT URINALYSIS DIPSTICK
Bilirubin, UA: NEGATIVE
Glucose, UA: NEGATIVE
KETONES UA: NEGATIVE
Leukocytes, UA: NEGATIVE
Nitrite, UA: NEGATIVE
PH UA: 5
PROTEIN UA: NEGATIVE
SPEC GRAV UA: 1.015
UROBILINOGEN UA: NEGATIVE

## 2014-02-09 LAB — TSH: TSH: 2.227 u[IU]/mL (ref 0.350–4.500)

## 2014-02-09 MED ORDER — LEVOCETIRIZINE DIHYDROCHLORIDE 5 MG PO TABS: ORAL_TABLET | ORAL | Status: DC

## 2014-02-09 MED ORDER — ALPRAZOLAM 0.5 MG PO TABS: 0.2500 mg | ORAL_TABLET | Freq: Three times a day (TID) | ORAL | Status: DC | PRN

## 2014-02-09 MED ORDER — FLUTICASONE PROPIONATE 50 MCG/ACT NA SUSP: NASAL | Status: DC

## 2014-02-09 MED ORDER — PAROXETINE HCL 40 MG PO TABS: ORAL_TABLET | ORAL | Status: DC

## 2014-02-09 MED ORDER — FLUTICASONE PROPIONATE HFA 110 MCG/ACT IN AERO: 1.0000 | INHALATION_SPRAY | Freq: Two times a day (BID) | RESPIRATORY_TRACT | Status: DC

## 2014-02-09 NOTE — Patient Instructions (Signed)
  HEALTH MAINTENANCE RECOMMENDATIONS:  It is recommended that you get at least 30 minutes of aerobic exercise at least 5 days/week (for weight loss, you may need as much as 60-90 minutes). This can be any activity that gets your heart rate up. This can be divided in 10-15 minute intervals if needed, but try and build up your endurance at least once a week.  Weight bearing exercise is also recommended twice weekly.  Eat a healthy diet with lots of vegetables, fruits and fiber.  "Colorful" foods have a lot of vitamins (ie green vegetables, tomatoes, red peppers, etc).  Limit sweet tea, regular sodas and alcoholic beverages, all of which has a lot of calories and sugar.  Up to 2 alcoholic drinks daily may be beneficial for men (unless trying to lose weight, watch sugars).  Drink a lot of water.  Sunscreen of at least SPF 30 should be used on all sun-exposed parts of the skin when outside between the hours of 10 am and 4 pm (not just when at beach or pool, but even with exercise, golf, tennis, and yard work!)  Use a sunscreen that says "broad spectrum" so it covers both UVA and UVB rays, and make sure to reapply every 1-2 hours.  Remember to change the batteries in your smoke detectors when changing your clock times in the spring and fall.  Use your seat belt every time you are in a car, and please drive safely and not be distracted with cell phones and texting while driving.  Please schedule a routine eye exam.  I strongly recommend that you get yearly flu shots, and pneumonia vaccination ([id number] is recommended).  Shingles vaccine is also recommended.

## 2014-02-09 NOTE — Progress Notes (Signed)
Chief Complaint  Patient presents with  . Annual Exam    fasting annual exam. Is having some lower back pain, or thinks he may possibly a prostate. Does not want to do flu shot or pneumonia vaccine.    [name] is a 68 y.o. male who presents for a complete physical.  He has the following concerns:  Last week he had some back pain after helping a friend lift some heavy items.  He saw a small amount of blood in his urine a few days later.  He denies any dysuria, urgency, frequency, hesitancy, weakened stream or dribbling.  Just having the back pain concerned him for possible prostate symptoms (has had prostatitis in the past).  Massage chair has helped with the pain.  Hasn't tried heat or NSAIDs. It seems to feel better after being on the elliptical.  He has known degenerative changes in his back.  Asthma: doing well overall. He uses Flovent 1 inhalation every morning, and has not needed to use a rescue inhaler in a long time. Breathing seems to be doing much better since allergies have been better controlled.   Allergies: Well controlled with flonase and xyzal, using the medications daily (just 1 spray daily of the Flonase).   Anxiety and panic disorder: Well controlled with Paxil.  He ran out of xanax 3-4 months ago, and hasn't really needed it.  He would like a refill to have on hand, "just in case".    Hyperlipidemia: He has been prescribed meds in the past, but admits to never taking them, worries about effects on liver. After labs 10/2012 he started taking red yeast rice.  He took it for "two bottles", maybe 3-4 months.  He didn't have any side effects.  Never returned for labwork while on the medication.  He has been off of the RYR for quite a while now.  He rarely eats red meat.  He eats a lot of Healthy Choice steamer meals, with chicken.  He also eats a lot of soup, tries to get soups with 500mg  sodium.  Uses mayonnaise on his sandwiches daily.  He refuses flu shots and pneumonia  shot--he has done "research" and read about the toxins.  Absolutely refuses.  He feels sick for about a week after taking the flu shot.  He periodically checks his BP at the pharmacy, and runs 110-112/70's, never high.  Annual Wellness Visit:  Other MD's involved in patient's care:  Dr. Wynelle Link (ortho)  Dr. Geroge Baseman (dentist).  Dr. Jeffie Pollock (urologist) Dr. Wynetta Emery (GI) Nelda Severe (derm at [facility])  Last ophtho was several years ago   End of life issues: Doesn't have a living will or health care power of attorney--discussed last year.  Never had forms filled out.  Plans to discuss with his Doristine Bosworth.  Depression, fall risk and ADL screenings done. See scanned sheets--no concerns.  Immunization History  Administered Date(s) Administered  . Influenza Split 11/12/2011  . Influenza, Seasonal, Injecte, Preservative Fre 11/17/2012  . Pneumococcal Conjugate-13 05/22/2001  . Td 05/22/2001  . Tdap 09/11/2008  he did not get flu shot; he declined pneumonia vaccine at last visit; risks/benefits of zostavax reviewed at last visit, given rx, but he didn't get at pharmacy Last colonoscopy: 01/2010 Dr. Wynetta Emery, every 5 years  Last PSA: 10/2012  Dentist: regularly, every 6 months Ophtho: several years ago  Exercise daily--20 minutes on the elliptical daily, plus ab lounger.  He also does resistance work for upper and lower body  Past  Medical History  Diagnosis Date  . Panic disorder 2002  . Pure hypercholesterolemia   . DDD (degenerative disc disease), lumbar   . Anxiety   . History of alcohol abuse     QUIT 1993  . Mild asthma   . History of kidney stones   . Right ureteral stone   . DJD (degenerative joint disease)   . History of diverticulosis   . Renal calculus     RIGHT LOWER POLE  . Chronic back pain   . PONV (postoperative nausea and vomiting)   . Renal stone 08/04/2013    Past Surgical History  Procedure Laterality Date  . Appendectomy  age 45  . Tonsillectomy and adenoidectomy   age 7  . Left heel spur surgery  1989  . Nasal polyp surgery  1993,repeat 2006  . Elbow surgery Right 1995  &   1996  . Laparoscopic cholecystectomy  1998  . Total hip arthroplasty Left 04/26/2010    Dr. Wynelle Link  . Total hip revision  12/01/2011    Procedure: TOTAL HIP REVISION;  Surgeon: Dione Plover Aluisio;  Location: [facility];  Service: Orthopedics;  Laterality: Left;  . Excision/release bursa hip  10/26/2012    Procedure: EXCISION/RELEASE BURSA HIP;  Surgeon: Gearlean Alf, MD;  Location: [facility];  Service: Orthopedics;  Laterality: Left;  Left Hip Bursectomy with Tendon Repair  . Tendon repair  10/26/2012    Procedure: TENDON REPAIR;  Surgeon: Gearlean Alf, MD;  Location: [facility];  Service: Orthopedics;  Laterality: Left;  . Knee arthroscopy w/ meniscectomy  [facility]  . Cervical fusion  1993  . Left ureteroscopic stone extraction  04-17-2009  . Right ureteroscopic holium lasertripsy/ stent placement  05-15-2011  . Shoulder arthroscopy with rotator cuff repair and subacromial decompression Left 10-05-2008    AND DEBRIDEMENT LABRAL  . Closed left shoulder manipulation/ excision lipoma  02-15-2009  . Cystoscopy with retrograde pyelogram, ureteroscopy and stent placement Right 08/04/2013    Procedure: [place];  Surgeon: Malka So, MD;  Location: [facility];  Service: Urology;  Laterality: Right;  URETEROSCOPY WITH LASER APPLICATION    History   Social History  . Marital Status: Divorced    Spouse Name: N/A    Number of Children: 2  . Years of Education: N/A   Occupational History  . retired from [employer]  . Smoking status: Never Smoker   . Smokeless tobacco: Never Used  . Alcohol Use: No     Comment: quit alcohol 04/20/1992  . Drug Use: No  . Sexual Activity: Not Currently   Other Topics Concern  . Not on file   Social History Narrative   Lives alone, 1 dog.  Children are in [place], [place]    Family History   Problem Relation Age of Onset  . Hypertension Mother   . Anxiety disorder Mother   . Cancer Mother     male cancer  . Cancer Father     colon  . Colon cancer Father   . Panic disorder Daughter   . Diverticulosis Daughter   . Cancer Maternal Grandmother     breast and stomach  . Heart disease Maternal Grandmother   . Cancer Paternal Grandmother 20    colon cancer  . Colon cancer Paternal Grandmother   . Alcohol abuse Brother   . Diabetes Neg Hx     Current outpatient prescriptions:fluticasone (FLONASE) 50 MCG/ACT  nasal spray, USE 2 SPRAYS IN THE NOSTRIL ONCE A DAY, Disp: 16 g, Rfl: 11;  fluticasone (FLOVENT HFA) 110 MCG/ACT inhaler, Inhale 1 puff into the lungs 2 (two) times daily., Disp: 1 Inhaler, Rfl: 11;  levocetirizine (XYZAL) 5 MG tablet, TAKE 1 TABLET (5 MG TOTAL) BY MOUTH DAILY WITH BREAKFAST., Disp: 30 tablet, Rfl: 11 Multiple Vitamin (MULTIVITAMIN WITH MINERALS) TABS, Take 1 tablet by mouth daily., Disp: , Rfl: ;  PARoxetine (PAXIL) 40 MG tablet, TAKE 1 TABLET EVERY MORNING, Disp: 30 tablet, Rfl: 11;  acetaminophen (TYLENOL) 500 MG tablet, Take 500 mg by mouth every 6 (six) hours as needed. For pain, Disp: , Rfl: ;  albuterol (PROVENTIL HFA;VENTOLIN HFA) 108 (90 BASE) MCG/ACT inhaler, Inhale 2 puffs into the lungs every 6 (six) hours as needed., Disp: , Rfl:  ALPRAZolam (XANAX) 0.5 MG tablet, Take 0.5-1 tablets (0.25-0.5 mg total) by mouth 3 (three) times daily as needed for anxiety or sleep., Disp: 20 tablet, Rfl: 0;  [DISCONTINUED] polysaccharide iron (NIFEREX) 150 MG CAPS capsule, Take 1 capsule (150 mg total) by mouth daily., Disp: 21 each, Rfl: 0;  [DISCONTINUED] rivaroxaban (XARELTO) 10 MG TABS tablet, Take 1 tablet (10 mg total) by mouth daily with breakfast., Disp: 19 tablet, Rfl: 0  No Known Allergies  ROS: The patient denies anorexia, fever, weight changes, headaches, vision loss, decreased hearing, ear pain, hoarseness, chest pain, palpitations, dizziness, syncope,  dyspnea on exertion, cough, swelling, nausea, vomiting, diarrhea, constipation, abdominal pain, melena, hematochezia, indigestion/heartburn, incontinence, erectile dysfunction, nocturia, weakened urine stream, dysuria, genital lesions, joint pains, numbness, tingling, weakness, tremor, suspicious skin lesions, depression, anxiety, abnormal bleeding/bruising, or enlarged lymph nodes  Up 1-2 times/night to void, drinks a lot of water at night. +left sided low back pain, improving +recent hematuria;  H/o kidney stones, currently denies any pain similar to stones in past  PHYSICAL EXAM:  BP 140/94  Pulse 64  Ht 6' 2.25" (1.886 m)  Wt 215 lb (97.523 kg)  BMI 27.42 kg/m2 134/84 on repeat by MD  General Appearance:  Alert, cooperative, no distress, appears stated age, somewhat older   Head:  Normocephalic, without obvious abnormality, atraumatic   Eyes:  PERRL, conjunctiva/corneas clear, EOM's intact, fundi  benign   Ears:  Normal TM's and external ear canals. Mod cerumen in L ear, minimal in right.  Nose:  Nares normal, mucosa normal, no drainage or sinus tenderness   Throat:  Lips, mucosa, and tongue normal; teeth and gums normal   Neck:  Supple, no lymphadenopathy; thyroid: no enlargement/tenderness/nodules; no carotid  bruit or JVD   Back:  Spine nontender, no curvature, ROM normal, no CVA tenderness. Area of discomfort is left lower paraspinous muscle--nontender today, no muscle spasm  Lungs:  Clear to auscultation bilaterally without wheezes, rales or ronchi; respirations unlabored   Chest Wall:  No tenderness or deformity   Heart:  Regular rate and rhythm, S1 and S2 normal, no murmur, rub  or gallop   Breast Exam:  No chest wall tenderness, masses or gynecomastia   Abdomen:  Soft, non-tender, nondistended, normoactive bowel sounds,  no masses, no hepatosplenomegaly   Genitalia:  Normal male external genitalia without lesions. Testicles without masses. No inguinal hernias.   Rectal:   Normal sphincter tone, no masses or tenderness; guaiac negative stool. Prostate smooth, no nodules, min-mod enlarged.   Extremities:  No clubbing, cyanosis or edema   Pulses:  2+ and symmetric all extremities   Skin:  Skin color, texture, turgor normal, no rashes  or lesions.   Lymph nodes:  Cervical, supraclavicular, and axillary nodes normal   Neurologic:  CNII-XII intact, normal strength, sensation and gait; reflexes 2+ and symmetric throughout          Psych: Normal mood, affect, hygiene and grooming.    ASSESSMENT/PLAN:  Routine general medical examination at a health care facility - Plan: Visual acuity screening, POCT Urinalysis Dipstick, Comprehensive metabolic panel, Lipid panel, PSA, Medicare, CBC with Differential, TSH  PANIC DISORDER - stable/controlled - Plan: PARoxetine (PAXIL) 40 MG tablet, ALPRAZolam (XANAX) 0.5 MG tablet  ASTHMA - controlled.  pneumovax and flu shots recommended yearly.  counseled re: risks/benefits and pt refuses - Plan: fluticasone (FLOVENT HFA) 110 MCG/ACT inhaler  ALLERGIC RHINITIS - controlled - Plan: levocetirizine (XYZAL) 5 MG tablet, fluticasone (FLONASE) 50 MCG/ACT nasal spray  Pure hypercholesterolemia - Plan: Lipid panel  Renal stone  Hematuria - microscopic, noted on today's exam.  h/o stones. encouraged adequate hydration - Plan: PSA, Medicare, CBC with Differential  Special screening for malignant neoplasm of prostate  Encounter for long-term (current) use of other medications - Plan: Comprehensive metabolic panel, Lipid panel, CBC with Differential, TSH  Discussed PSA screening (risks/benefits), recommended at least 30 minutes of aerobic activity at least 5 days/week; proper sunscreen use reviewed; healthy diet and alcohol recommendations (less than or equal to 2 drinks/day) reviewed; regular seatbelt use; changing batteries in smoke detectors. Self-testicular exams. Immunization recommendations discussed at length--see below.  Colonoscopy  recommendations reviewed, due again 01/2015  End of life care--given handout/info on Living Will and healthcare power of attorney (again). MOST form reviewed and updated.  Offered EKG--he states he has had many related to surgery, all normal; declines.  Allergies--controlled  Asthma--controlled.  Advised to increase flovent to BID if symptoms worsen, requiring albuterol prn  Hyperlipidemia--reviewed lowfat low cholesterol diet in detail. Advised that if remains elevated, he can retry red yeast rice (hesitant to take rx statin), but to return in 3 mos to have labs drawn to see if treatment is adequate.  We also discussed need for monitoring liver tests while on the RYR.  Counseled extensively re: immunizations, toxins (minimal levels, not causing disease), reasons to feel bad after vaccine (as immune system kicks in). Discussed risks  Schedule routine eye exam

## 2014-02-10 LAB — PSA, MEDICARE: PSA: 1.48 ng/mL (ref <=4.00)

## 2014-02-22 ENCOUNTER — Other Ambulatory Visit: Payer: Self-pay | Admitting: *Deleted

## 2014-02-22 DIAGNOSIS — Z79899 Other long term (current) drug therapy: Secondary | ICD-10-CM

## 2014-02-22 DIAGNOSIS — E7800 Pure hypercholesterolemia, unspecified: Secondary | ICD-10-CM

## 2014-03-11 ENCOUNTER — Other Ambulatory Visit: Payer: Self-pay | Admitting: Family Medicine

## 2014-04-13 ENCOUNTER — Encounter: Payer: Self-pay | Admitting: Family Medicine

## 2014-04-13 ENCOUNTER — Ambulatory Visit (INDEPENDENT_AMBULATORY_CARE_PROVIDER_SITE_OTHER): Payer: Medicare Other | Admitting: Family Medicine

## 2014-04-13 VITALS — BP 110/68 | HR 72 | Temp 97.6°F | Ht 74.25 in | Wt 218.0 lb

## 2014-04-13 DIAGNOSIS — N390 Urinary tract infection, site not specified: Secondary | ICD-10-CM

## 2014-04-13 DIAGNOSIS — R350 Frequency of micturition: Secondary | ICD-10-CM

## 2014-04-13 LAB — POCT URINALYSIS DIPSTICK
BILIRUBIN UA: NEGATIVE
GLUCOSE UA: NEGATIVE
Ketones, UA: NEGATIVE
NITRITE UA: NEGATIVE
PH UA: 5
SPEC GRAV UA: 1.015
Urobilinogen, UA: NEGATIVE

## 2014-04-13 MED ORDER — CIPROFLOXACIN HCL 250 MG PO TABS: 250.0000 mg | ORAL_TABLET | Freq: Two times a day (BID) | ORAL | Status: DC

## 2014-04-13 NOTE — Progress Notes (Signed)
Chief Complaint  Patient presents with  . Urinary Frequency    and burning that started yesterday. Also conplains of decreased stream and wonders if he could have a prostate infection. Also wants to know if he should be taking his xyzal year round.    Yesterday he started having urinary frequency, urine is slow to come out.  He feels like he empties his bladder well when he voids.  He has some mild burning when he voids.  He has a h/o UTI in the past, and it felt similar.  He denies fevers, chills, nausea, vomiting.  He also has a h/o prostatitis in the past, as well as h/o kidney stones; recently passed some, but he currently denies any back or abdominal pain.  Past Medical History  Diagnosis Date  . Panic disorder 2002  . Pure hypercholesterolemia   . DDD (degenerative disc disease), lumbar   . Anxiety   . History of alcohol abuse     QUIT 1993  . Mild asthma   . History of kidney stones   . Right ureteral stone   . DJD (degenerative joint disease)   . History of diverticulosis   . Renal calculus     RIGHT LOWER POLE  . Chronic back pain   . PONV (postoperative nausea and vomiting)   . Renal stone 08/04/2013   Past Surgical History  Procedure Laterality Date  . Appendectomy  age 46  . Tonsillectomy and adenoidectomy  age 48  . Left heel spur surgery  1989  . Nasal polyp surgery  1993,repeat 2006  . Elbow surgery Right 1995  &   1996  . Laparoscopic cholecystectomy  1998  . Total hip arthroplasty Left 04/26/2010    Dr. Wynelle Link  . Total hip revision  12/01/2011    Procedure: TOTAL HIP REVISION;  Surgeon: Dione Plover Aluisio;  [facility];  Service: Orthopedics;  Laterality: Left;  . Excision/release bursa hip  10/26/2012    Procedure: EXCISION/RELEASE BURSA HIP;  Surgeon: Gearlean Alf, MD;  Location[facility];  Service: Orthopedics;  Laterality: Left;  Left Hip Bursectomy with Tendon Repair  . Tendon repair  10/26/2012    Procedure: TENDON REPAIR;  Surgeon: Gearlean Alf, MD;   Location[facility];  Service: Orthopedics;  Laterality: Left;  . Knee arthroscopy w/ meniscectomy  [place]  . Cervical fusion  1993  . Left ureteroscopic stone extraction  04-17-2009  . Right ureteroscopic holium lasertripsy/ stent placement  05-15-2011  . Shoulder arthroscopy with rotator cuff repair and subacromial decompression Left 10-05-2008    AND DEBRIDEMENT LABRAL  . Closed left shoulder manipulation/ excision lipoma  02-15-2009  . Cystoscopy with retrograde pyelogram, ureteroscopy and stent placement Right 08/04/2013    Procedure: [place];  Surgeon: Malka So, MD;  [facility];  Service: Urology;  Laterality: Right;  URETEROSCOPY WITH LASER APPLICATION   History   Social History  . Marital Status: Divorced    Spouse Name: N/A    Number of Children: 2  . Years of Education: N/A   Occupational History  . retired from [employer]  . Smoking status: Never Smoker   . Smokeless tobacco: Never Used  . Alcohol Use: No     Comment: quit alcohol 04/20/1992  . Drug Use: No  . Sexual Activity: Not Currently   Other Topics Concern  . Not on file   Social History Narrative  Lives alone, 1 dog.  Children are in [place], [place]   Outpatient Encounter Prescriptions as of 04/13/2014  Medication Sig Note  . ALPRAZolam (XANAX) 0.5 MG tablet Take 0.5-1 tablets (0.25-0.5 mg total) by mouth 3 (three) times daily as needed for anxiety or sleep.   . fluticasone (FLONASE) 50 MCG/ACT nasal spray USE 2 SPRAYS IN THE NOSTRIL ONCE A DAY   . fluticasone (FLOVENT HFA) 110 MCG/ACT inhaler Inhale 1 puff into the lungs 2 (two) times daily.   Marland Kitchen levocetirizine (XYZAL) 5 MG tablet TAKE 1 TABLET (5 MG TOTAL) BY MOUTH DAILY WITH BREAKFAST.   . Multiple Vitamin (MULTIVITAMIN WITH MINERALS) TABS Take 1 tablet by mouth daily. 11/17/2012: Taking daily  . PARoxetine (PAXIL) 40 MG tablet TAKE 1 TABLET EVERY MORNING   . acetaminophen (TYLENOL) 500  MG tablet Take 500 mg by mouth every 6 (six) hours as needed. For pain   . albuterol (PROVENTIL HFA;VENTOLIN HFA) 108 (90 BASE) MCG/ACT inhaler Inhale 2 puffs into the lungs every 6 (six) hours as needed.   . ciprofloxacin (CIPRO) 250 MG tablet Take 1 tablet (250 mg total) by mouth 2 (two) times daily.   . [DISCONTINUED] PARoxetine (PAXIL) 40 MG tablet TAKE 1 TABLET EVERY MORNING    No Known Allergies  ROS:  Denies fevers, chills, nausea, vomiting, abdominal pain, flank pain, bleeding/bruising, rashes, URI symptoms, chest pain or other complaints  PHYSICAL EXAM: BP 110/68  Pulse 72  Temp(Src) 97.6 F (36.4 C) (Oral)  Ht 6' 2.25" (1.886 m)  Wt 218 lb (98.884 kg)  BMI 27.80 kg/m2 Well developed, pleasant male in no distress  Back: no CVA tenderness Abdomen: soft, nontender.  No suprapubic tenderness, no bladder distension/mass.  Urine dip 3+ blood, trace protein, trace leuks  ASSESSMENT/PLAN:  Urinary tract infection, site not specified - Plan: ciprofloxacin (CIPRO) 250 MG tablet, Urine culture  Urinary frequency - Plan: POCT Urinalysis Dipstick  Suspect UTI vs prostatitis.  Given hematuria on urine dip, and acute onset, and feeling like he empties bladder well, suspect simple UTI.  However, the blood could be remnant of recent stones.  Treat for UTI, send urine culture.  If culture is negative and symptoms persist, then increase the dose of cipro and treat for a longer course.

## 2014-04-13 NOTE — Patient Instructions (Signed)
Start the cipro today.  Drink plenty of fluids. We will be in touch next week with your urine culture results. Return if developing fever, chills, back pain (flank/kidney), vomiting (and not keeping down your antibiotics) or any other new concerns.   It is okay to cut back the use of xyzal to taking it just as needed, starting this AFTER your peak season of allergies has ended.

## 2014-04-15 LAB — URINE CULTURE: Colony Count: 8000

## 2014-04-16 ENCOUNTER — Other Ambulatory Visit: Payer: Self-pay | Admitting: Medical

## 2014-05-11 ENCOUNTER — Other Ambulatory Visit: Payer: Self-pay

## 2014-06-27 ENCOUNTER — Other Ambulatory Visit: Payer: Self-pay | Admitting: Family Medicine

## 2014-07-17 ENCOUNTER — Telehealth: Payer: Self-pay | Admitting: *Deleted

## 2014-07-17 NOTE — Telephone Encounter (Signed)
If he is willing to do some sort of treatment if the cholesterol remains high, then it is worth rechecking (liver tests aren't needed if not on supplements). If not willing to consider any form of treatment, then we will just recheck at his physical in February.

## 2014-07-17 NOTE — Telephone Encounter (Signed)
Message copied by Clinton Sawyer on Mon Jul 17, 2014  4:52 PM ------      Message from: KNAPP, EVE      Created: Mon Jul 17, 2014  8:35 AM      Regarding: expired/extended labs       I just extended his labs.  He was supposed to come in May to have his lipids and LFT's checked while on red yeast rice, but he canceled his appt.  He is due to have lipids rechecked, and if taking the red yeast rice, LFT's are also recommended.  His next appt isn't until Feb for CPE. ------

## 2014-07-17 NOTE — Telephone Encounter (Signed)
Spoke with patient and he states that he decided not to take the red yeast rice so there was no need for him to come in for the labs in May. He said he is working on diet and exercise. Is this okay or were you wanting him to have lipids checked anyhow?

## 2014-07-19 NOTE — Telephone Encounter (Signed)
Would like to wait until CPE in Feb 2016.

## 2014-12-06 ENCOUNTER — Other Ambulatory Visit: Payer: Self-pay | Admitting: Family Medicine

## 2014-12-19 ENCOUNTER — Telehealth: Payer: Self-pay | Admitting: Family Medicine

## 2014-12-19 MED ORDER — ALBUTEROL SULFATE HFA 108 (90 BASE) MCG/ACT IN AERS: 2.0000 | INHALATION_SPRAY | Freq: Four times a day (QID) | RESPIRATORY_TRACT | Status: AC | PRN

## 2014-12-19 NOTE — Telephone Encounter (Signed)
done

## 2015-02-12 ENCOUNTER — Encounter: Payer: Self-pay | Admitting: Family Medicine

## 2015-02-12 ENCOUNTER — Ambulatory Visit (INDEPENDENT_AMBULATORY_CARE_PROVIDER_SITE_OTHER): Payer: Medicare Other | Admitting: Family Medicine

## 2015-02-12 VITALS — BP 120/84 | HR 60 | Ht 74.25 in | Wt 203.0 lb

## 2015-02-12 DIAGNOSIS — F410 Panic disorder [episodic paroxysmal anxiety] without agoraphobia: Secondary | ICD-10-CM

## 2015-02-12 DIAGNOSIS — Z860100 Personal history of colon polyps, unspecified: Secondary | ICD-10-CM

## 2015-02-12 DIAGNOSIS — J4520 Mild intermittent asthma, uncomplicated: Secondary | ICD-10-CM

## 2015-02-12 DIAGNOSIS — E7800 Pure hypercholesterolemia, unspecified: Secondary | ICD-10-CM

## 2015-02-12 DIAGNOSIS — Z7189 Other specified counseling: Secondary | ICD-10-CM

## 2015-02-12 DIAGNOSIS — Z125 Encounter for screening for malignant neoplasm of prostate: Secondary | ICD-10-CM

## 2015-02-12 DIAGNOSIS — Z0000 Encounter for general adult medical examination without abnormal findings: Secondary | ICD-10-CM

## 2015-02-12 DIAGNOSIS — J302 Other seasonal allergic rhinitis: Secondary | ICD-10-CM

## 2015-02-12 LAB — CBC WITH DIFFERENTIAL/PLATELET
BASOS PCT: 1 % (ref 0–1)
Basophils Absolute: 0 10*3/uL (ref 0.0–0.1)
EOS PCT: 5 % (ref 0–5)
Eosinophils Absolute: 0.2 10*3/uL (ref 0.0–0.7)
HEMATOCRIT: 41.3 % (ref 39.0–52.0)
Hemoglobin: 14.5 g/dL (ref 13.0–17.0)
LYMPHS ABS: 1.3 10*3/uL (ref 0.7–4.0)
LYMPHS PCT: 28 % (ref 12–46)
MCH: 33.3 pg (ref 26.0–34.0)
MCHC: 35.1 g/dL (ref 30.0–36.0)
MCV: 94.9 fL (ref 78.0–100.0)
MONO ABS: 0.3 10*3/uL (ref 0.1–1.0)
MPV: 9.1 fL (ref 8.6–12.4)
Monocytes Relative: 7 % (ref 3–12)
NEUTROS ABS: 2.8 10*3/uL (ref 1.7–7.7)
NEUTROS PCT: 59 % (ref 43–77)
PLATELETS: 206 10*3/uL (ref 150–400)
RBC: 4.35 MIL/uL (ref 4.22–5.81)
RDW: 13.3 % (ref 11.5–15.5)
WBC: 4.7 10*3/uL (ref 4.0–10.5)

## 2015-02-12 LAB — TSH: TSH: 1.689 u[IU]/mL (ref 0.350–4.500)

## 2015-02-12 LAB — COMPREHENSIVE METABOLIC PANEL
ALBUMIN: 4.4 g/dL (ref 3.5–5.2)
ALK PHOS: 56 U/L (ref 39–117)
ALT: 18 U/L (ref 0–53)
AST: 22 U/L (ref 0–37)
BILIRUBIN TOTAL: 0.5 mg/dL (ref 0.2–1.2)
BUN: 13 mg/dL (ref 6–23)
CHLORIDE: 105 meq/L (ref 96–112)
CO2: 28 meq/L (ref 19–32)
Calcium: 9.5 mg/dL (ref 8.4–10.5)
Creat: 1.05 mg/dL (ref 0.50–1.35)
Glucose, Bld: 99 mg/dL (ref 70–99)
Potassium: 4.3 mEq/L (ref 3.5–5.3)
SODIUM: 141 meq/L (ref 135–145)
Total Protein: 6.7 g/dL (ref 6.0–8.3)

## 2015-02-12 LAB — POCT URINALYSIS DIPSTICK
BILIRUBIN UA: NEGATIVE
Glucose, UA: NEGATIVE
Ketones, UA: NEGATIVE
Leukocytes, UA: NEGATIVE
Nitrite, UA: NEGATIVE
PH UA: 6
PROTEIN UA: NEGATIVE
RBC UA: NEGATIVE
SPEC GRAV UA: 1.02
Urobilinogen, UA: NEGATIVE

## 2015-02-12 LAB — LIPID PANEL
CHOLESTEROL: 210 mg/dL — AB (ref 0–200)
HDL: 52 mg/dL (ref >=40)
LDL CALC: 140 mg/dL — AB (ref 0–99)
TRIGLYCERIDES: 89 mg/dL (ref <150)
Total CHOL/HDL Ratio: 4 Ratio
VLDL: 18 mg/dL (ref 0–40)

## 2015-02-12 MED ORDER — PAROXETINE HCL 40 MG PO TABS: ORAL_TABLET | ORAL | Status: DC

## 2015-02-12 MED ORDER — ALPRAZOLAM 0.5 MG PO TABS: 0.2500 mg | ORAL_TABLET | Freq: Three times a day (TID) | ORAL | Status: DC | PRN

## 2015-02-12 NOTE — Patient Instructions (Addendum)
  HEALTH MAINTENANCE RECOMMENDATIONS:  It is recommended that you get at least 30 minutes of aerobic exercise at least 5 days/week (for weight loss, you may need as much as 60-90 minutes). This can be any activity that gets your heart rate up. This can be divided in 10-15 minute intervals if needed, but try and build up your endurance at least once a week.  Weight bearing exercise is also recommended twice weekly.  Eat a healthy diet with lots of vegetables, fruits and fiber.  "Colorful" foods have a lot of vitamins (ie green vegetables, tomatoes, red peppers, etc).  Limit sweet tea, regular sodas and alcoholic beverages, all of which has a lot of calories and sugar.  Up to 2 alcoholic drinks daily may be beneficial for men (unless trying to lose weight, watch sugars).  Drink a lot of water.  Sunscreen of at least SPF 30 should be used on all sun-exposed parts of the skin when outside between the hours of 10 am and 4 pm (not just when at beach or pool, but even with exercise, golf, tennis, and yard work!)  Use a sunscreen that says "broad spectrum" so it covers both UVA and UVB rays, and make sure to reapply every 1-2 hours.  Remember to change the batteries in your smoke detectors when changing your clock times in the spring and fall.  Use your seat belt every time you are in a car, and please drive safely and not be distracted with cell phones and texting while driving.    If you find you are needing to use the rescue inhaler frequently at night (ie 2x/week) then increase your Flovent to twice daily.  Please call and schedule a routine eye exam--this is important for you to do to have screening for glaucoma and other problems that might not cause you symptoms or problems.   I highly recommend that you get Prevnar-13 (a type of pneumonia vaccine).  As we previously discussed, yearly flu shots and pneumovax are also recommended (the pneumovax should be given a year after the prevnar).  Please  call [facility] and set up your colonoscopy.   Please look again at the Living will and healthcare power of attorney paperwork I gave you last year.  If you need another set, let us know and we can mail it to you.

## 2015-02-12 NOTE — Progress Notes (Signed)
Chief Complaint  Patient presents with  . Annual Exam    fasting annual exam. No concerns.   [name] is a 69 y.o. male who presents for a complete physical.  He is also here to follow up on his chronic problems.  He denies any specific concerns or complaints today.  H/o Kidney stones:  He had a small stone left after his last visit here.  He saw Dr. Jeffie Pollock, and ultimately passed this stone on his own.  No further urinary symptoms.  Asthma: doing well overall. He uses Flovent 1 inhalation every morning, and has not needed to use a rescue inhaler in a long time.  He had that inhaler for 2-3 years, and recently refilled it.  He needed it once at night recently.  Allergies:   He takes Xyzal April through June, takes just seasonally in the Spring. Has not been needing now, and not complaining of any allergy symptoms.  He continues to take Flonase daily (1 spray each nostril).  Anxiety and panic disorder: Well controlled with Paxil. Denies any anxiety or panic attacks.  He got xanax prescription last year, but he never filled it. It has now expired. He would like a new prescription to have on hand, just in case.  Hyperlipidemia: He has been prescribed meds in the past, but admits to never taking them, worries about effects on liver. At one point he took red yeast rice, maybe for 3-4 months, but never had labs while taking it.  Hasn't taken it in over a year. He didn't have any side effects. He rarely eats red meat. He eats a lot of Healthy Choice steamer meals, with chicken. He also eats a lot of soup, tries to get soups with 500mg  sodium. He changed from mayonnaise to mustard on his sandwiches. He cut back on peanut M&Ms and cut out the mayonnaise, and he has last 12#. He cut back from eight 51 ounce bags of peanut M&Ms each month now down to 5 bags/month.  He refuses flu shots and pneumonia shot--as previously discussed, he has done "research" and read about the toxins. Absolutely  refuses. He feels sick for about a week after taking the flu shot.   Immunization History  Administered Date(s) Administered  . Influenza Split 11/12/2011  . Influenza, Seasonal, Injecte, Preservative Fre 11/17/2012  . Pneumococcal Polysaccharide-23 05/22/2001  . Td 05/22/2001  . Tdap 09/11/2008   Last colonoscopy: 01/2010 Dr. Wynetta Emery, every 5 years  Last PSA: last year Dentist: once yearly Ophtho: several years ago  Exercise daily--20 minutes on the elliptical daily, plus ab lounger. He also does resistance work for upper and lower body   Other MD's involved in patient's care:  Dr. Wynelle Link (ortho)  Dr. Geroge Baseman (dentist).  Dr. Jeffie Pollock (urologist) Dr. Wynetta Emery (GI) Nelda Severe (derm at [place])  End of life issues: Doesn't have a living will or health care power of attorney--he was given paperwork last year, but admits that he never had forms filled out. He did discuss with his Doristine Bosworth.  Past Medical History  Diagnosis Date  . Panic disorder 2002  . Pure hypercholesterolemia   . DDD (degenerative disc disease), lumbar   . Anxiety   . History of alcohol abuse     QUIT 1993  . Mild asthma   . History of kidney stones   . Right ureteral stone   . DJD (degenerative joint disease)   . History of diverticulosis   . Renal calculus  RIGHT LOWER POLE  . Chronic back pain   . PONV (postoperative nausea and vomiting)   . Renal stone 08/04/2013    Past Surgical History  Procedure Laterality Date  . Appendectomy  age 74  . Tonsillectomy and adenoidectomy  age 89  . Left heel spur surgery  1989  . Nasal polyp surgery  1993,repeat 2006  . Elbow surgery Right 1995  &   1996  . Laparoscopic cholecystectomy  1998  . Total hip arthroplasty Left 04/26/2010    Dr. Wynelle Link  . Total hip revision  12/01/2011    Procedure: TOTAL HIP REVISION;  Surgeon: Dione Plover Aluisio;  Location: [facility];  Service: Orthopedics;  Laterality: Left;  . Excision/release bursa hip  10/26/2012     Procedure: EXCISION/RELEASE BURSA HIP;  Surgeon: Gearlean Alf, MD;  Location: [facility];  Service: Orthopedics;  Laterality: Left;  Left Hip Bursectomy with Tendon Repair  . Tendon repair  10/26/2012    Procedure: TENDON REPAIR;  Surgeon: Gearlean Alf, MD;  Location: [facility];  Service: Orthopedics;  Laterality: Left;  . Knee arthroscopy w/ meniscectomy  [place]  . Cervical fusion  1993  . Left ureteroscopic stone extraction  04-17-2009  . Right ureteroscopic holium lasertripsy/ stent placement  05-15-2011  . Shoulder arthroscopy with rotator cuff repair and subacromial decompression Left 10-05-2008    AND DEBRIDEMENT LABRAL  . Closed left shoulder manipulation/ excision lipoma  02-15-2009  . Cystoscopy with retrograde pyelogram, ureteroscopy and stent placement Right 08/04/2013    Procedure: [place];  Surgeon: Malka So, MD;  Location: [facility];  Service: Urology;  Laterality: Right;  URETEROSCOPY WITH LASER APPLICATION    History   Social History  . Marital Status: Divorced    Spouse Name: N/A  . Number of Children: 2  . Years of Education: N/A   Occupational History  . retired from [employer]  . Smoking status: Never Smoker   . Smokeless tobacco: Never Used  . Alcohol Use: No     Comment: quit alcohol 04/20/1992  . Drug Use: No  . Sexual Activity: Not Currently   Other Topics Concern  . Not on file   Social History Narrative   Lives alone, 1 dog (cocoa, 37 yo).  Children are in [place], [place], 3 grandchildren    Family History  Problem Relation Age of Onset  . Hypertension Mother   . Anxiety disorder Mother   . Cancer Mother     male cancer  . Cancer Father     colon  . Colon cancer Father   . Panic disorder Daughter   . Diverticulosis Daughter   . Cancer Maternal Grandmother     breast and stomach  . Heart disease Maternal Grandmother   . Cancer Paternal Grandmother 52    colon cancer  . Colon  cancer Paternal Grandmother   . Alcohol abuse Brother   . Diabetes Neg Hx     Outpatient Encounter Prescriptions as of 02/12/2015  Medication Sig Note  . acetaminophen (TYLENOL) 500 MG tablet Take 500 mg by mouth every 6 (six) hours as needed. For pain   . albuterol (PROVENTIL HFA;VENTOLIN HFA) 108 (90 BASE) MCG/ACT inhaler Inhale 2 puffs into the lungs every 6 (six) hours as needed. (Patient not taking: Reported on 02/12/2015)   . ALPRAZolam (XANAX) 0.5 MG tablet Take 0.5-1 tablets (0.25-0.5 mg total) by mouth 3 (three) times daily  as needed for anxiety or sleep. (Patient not taking: Reported on 02/12/2015) 02/12/2015: Never filled rx from last year  . clindamycin (CLEOCIN) 300 MG capsule Take 300 mg by mouth 2 (two) times daily.  02/12/2015: Prior to dental work  . fluticasone (FLONASE) 50 MCG/ACT nasal spray USE 2 SPRAYS IN THE NOSTRIL ONCE A DAY 02/12/2015: Uses 1 spray in each nostril daily  . fluticasone (FLOVENT HFA) 110 MCG/ACT inhaler Inhale 1 puff into the lungs 2 (two) times daily.   Marland Kitchen levocetirizine (XYZAL) 5 MG tablet TAKE 1 TABLET (5 MG TOTAL) BY MOUTH DAILY WITH BREAKFAST. (Patient not taking: Reported on 02/12/2015) 02/12/2015: Uses seasonally, April through June  . Multiple Vitamin (MULTIVITAMIN WITH MINERALS) TABS Take 1 tablet by mouth daily. 11/17/2012: Taking daily  . PARoxetine (PAXIL) 40 MG tablet TAKE 1 TABLET EVERY MORNING   . [DISCONTINUED] ciprofloxacin (CIPRO) 250 MG tablet Take 1 tablet (250 mg total) by mouth 2 (two) times daily.   . [DISCONTINUED] FLOVENT HFA 110 MCG/ACT inhaler INHALE 1 PUFF INTO THE LUNGS 2 (TWO) TIMES DAILY.   . [DISCONTINUED] fluticasone (FLONASE) 50 MCG/ACT nasal spray USE 2 SPRAYS IN THE NOSTRIL ONCE A DAY   . [DISCONTINUED] fluticasone (FLONASE) 50 MCG/ACT nasal spray USE 2 SPRAYS IN THE NOSTRIL ONCE A DAY     No Known Allergies  ROS: The patient denies anorexia, fever, headaches, vision loss, decreased hearing, ear pain, hoarseness, chest pain,  palpitations, dizziness, syncope, dyspnea on exertion, cough, swelling, nausea, vomiting, diarrhea, constipation, abdominal pain, melena, hematochezia, indigestion/heartburn, incontinence, erectile dysfunction, nocturia, weakened urine stream, dysuria, genital lesions, joint pains (arthritis in neck, back, knees--going on for a long time and he is "used to it".  Takes tylenol occasionally if worse).  Denies numbness, tingling, weakness, tremor, suspicious skin lesions, depression, anxiety, abnormal bleeding/bruising, or enlarged lymph nodes  Up 1-2 times/night to void, drinks a lot of water at night. Weight loss of 12# since his last physical last year (since cutting back on eating peanut M&M's)  PHYSICAL EXAM:  BP 120/84 mmHg  Pulse 60  Ht 6' 2.25" (1.886 m)  Wt 203 lb (92.08 kg)  BMI 25.89 kg/m2   General Appearance:  Alert, cooperative, no distress, appears stated age, somewhat older   Head:  Normocephalic, without obvious abnormality, atraumatic   Eyes:  PERRL, conjunctiva/corneas clear, EOM's intact, fundi  benign   Ears:  Normal TM's and external ear canals. Mod cerumen in both ears.  Nose:  Nares normal, mucosa normal, no drainage or sinus tenderness   Throat:  Lips, mucosa, and tongue normal; teeth and gums normal   Neck:  Supple, no lymphadenopathy; thyroid: no enlargement/tenderness/nodules; no carotid  bruit or JVD   Back:  Spine nontender, no curvature, ROM normal, no CVA tenderness.  Lungs:  Clear to auscultation bilaterally without wheezes, rales or ronchi; respirations unlabored   Chest Wall:  No tenderness or deformity   Heart:  Regular rate and rhythm, S1 and S2 normal, no murmur, rub  or gallop   Breast Exam:  No chest wall tenderness, masses or gynecomastia   Abdomen:  Soft, non-tender, nondistended, normoactive bowel sounds,  no masses, no hepatosplenomegaly   Genitalia:  Normal male external genitalia without lesions. Testicles without  masses. No inguinal hernias.   Rectal:  Normal sphincter tone, no masses or tenderness; guaiac negative stool. Prostate smooth, no nodules, mildly enlarged.   Extremities:  No clubbing, cyanosis or edema   Pulses:  2+ and symmetric all extremities  Skin:  Skin color, texture, turgor normal, no rashes or lesions. 2 cm nontender sebaceous cyst at left lower back. Excoriation L shin (recently was scratching there)  Lymph nodes:  Cervical, supraclavicular, and axillary nodes normal   Neurologic:  CNII-XII intact, normal strength, sensation and gait; reflexes 2+ and symmetric throughout   Psych:  Normal mood, affect, hygiene and grooming.          ASSESSMENT/PLAN:  Annual physical exam - Plan: Visual acuity screening, POCT Urinalysis Dipstick, Lipid panel, Comprehensive metabolic panel, CBC with Differential/Platelet, TSH, PSA, Medicare  Asthma, mild intermittent, uncomplicated - advised to increase flovent if requiring albuterol more than 2x/wk  History of colonic polyps - adenomatous (but not on recent colonoscopies).  also +FHx colon cancer.  Due now for repeat colonoscopy; pt to contact Dr. Wynetta Emery  Pure hypercholesterolemia - low cholesterol diet reviewed.  due for recheck - Plan: Lipid panel  Prostate cancer screening - Plan: PSA, Medicare  PANIC DISORDER - stable/controlled - Plan: ALPRAZolam (XANAX) 0.5 MG tablet, PARoxetine (PAXIL) 40 MG tablet  Seasonal allergies - continue flonase, xyzal in the spring  Advanced care planning/counseling discussion - full code, full care. MOST form in chart.  Pt advised to fill out Living Will and Healthcare power of attorney (has forms at home)   Discussed PSA screening (risks/benefits), recommended at least 30 minutes of aerobic activity at least 5 days/week; weight-bearing exercise at least 2x/wk; proper sunscreen use reviewed; healthy diet and alcohol recommendations (less than or equal to 2 drinks/day)  reviewed; regular seatbelt use; changing batteries in smoke detectors. Self-testicular exams. Immunization recommendations discussed at length--see below. Strongly encouraged Prevnar-13 (and followed by pneumovax in future, as well as yearly flu shots, which he refuses). Colonoscopy recommendations reviewed, due again 01/2015--pt to call and schedule.  End of life care--Encouraged him to fill out paperwork on Living Will and healthcare power of attorney (again). MOST form reviewed and updated.  Allergies--controlled  Asthma--controlled. Advised to increase flovent to BID if symptoms worsen, requiring albuterol prn  Hyperlipidemia--he hasn't been on any supplements, but cut out mayo from his diet.  Due for bloodwork.  Counseled extensively re: immunizations; he declines. Strongly encouraged [id number] today, followed by another dose of pneumovax (next year).  He will think about (but likely will refuse)  Pt advised to schedule eye exam.

## 2015-02-13 LAB — PSA, MEDICARE: PSA: 1.62 ng/mL (ref <=4.00)

## 2015-02-18 ENCOUNTER — Other Ambulatory Visit: Payer: Self-pay | Admitting: Family Medicine

## 2015-03-14 ENCOUNTER — Telehealth: Payer: Self-pay | Admitting: Family Medicine

## 2015-03-21 ENCOUNTER — Telehealth: Payer: Self-pay | Admitting: Family Medicine

## 2015-03-21 NOTE — Telephone Encounter (Signed)
Tier exception approved at the Tier 1 level, left message for pt

## 2015-03-21 NOTE — Telephone Encounter (Signed)
lm

## 2015-03-28 ENCOUNTER — Other Ambulatory Visit: Payer: Self-pay | Admitting: Gastroenterology

## 2015-04-25 ENCOUNTER — Telehealth: Payer: Self-pay | Admitting: Family Medicine

## 2015-04-25 DIAGNOSIS — J309 Allergic rhinitis, unspecified: Secondary | ICD-10-CM

## 2015-04-25 MED ORDER — LEVOCETIRIZINE DIHYDROCHLORIDE 5 MG PO TABS: ORAL_TABLET | ORAL | Status: AC

## 2015-04-25 NOTE — Telephone Encounter (Signed)
Done

## 2015-04-25 NOTE — Telephone Encounter (Signed)
Pt needs generic Xyzal to [facility] [address] (changed pharmacies)

## 2015-05-23 DIAGNOSIS — K635 Polyp of colon: Secondary | ICD-10-CM

## 2015-05-23 HISTORY — DX: Polyp of colon: K63.5

## 2015-05-28 ENCOUNTER — Other Ambulatory Visit: Payer: Self-pay | Admitting: Family Medicine

## 2015-06-12 ENCOUNTER — Other Ambulatory Visit: Payer: Self-pay | Admitting: Gastroenterology

## 2015-06-12 ENCOUNTER — Encounter (HOSPITAL_COMMUNITY): Payer: Self-pay | Admitting: *Deleted

## 2015-06-19 ENCOUNTER — Ambulatory Visit (HOSPITAL_COMMUNITY): Payer: Medicare Other | Admitting: Anesthesiology

## 2015-06-19 ENCOUNTER — Ambulatory Visit (HOSPITAL_COMMUNITY)
Admission: RE | Admit: 2015-06-19 | Discharge: 2015-06-19 | Disposition: A | Discharge status: Home / Self Care | Payer: Medicare Other | Source: Ambulatory Visit | Attending: Gastroenterology | Admitting: Gastroenterology

## 2015-06-19 ENCOUNTER — Encounter (HOSPITAL_COMMUNITY): Payer: Self-pay | Admitting: *Deleted

## 2015-06-19 ENCOUNTER — Encounter (HOSPITAL_COMMUNITY)
Admission: RE | Disposition: A | Discharge status: Home / Self Care | Payer: Self-pay | Source: Ambulatory Visit | Attending: Gastroenterology

## 2015-06-19 DIAGNOSIS — Z981 Arthrodesis status: Secondary | ICD-10-CM | POA: Insufficient documentation

## 2015-06-19 DIAGNOSIS — K5730 Diverticulosis of large intestine without perforation or abscess without bleeding: Secondary | ICD-10-CM | POA: Diagnosis not present

## 2015-06-19 DIAGNOSIS — D120 Benign neoplasm of cecum: Secondary | ICD-10-CM | POA: Diagnosis not present

## 2015-06-19 DIAGNOSIS — J45909 Unspecified asthma, uncomplicated: Secondary | ICD-10-CM | POA: Diagnosis not present

## 2015-06-19 DIAGNOSIS — Z800 Family history of malignant neoplasm of digestive organs: Secondary | ICD-10-CM | POA: Diagnosis not present

## 2015-06-19 DIAGNOSIS — Z86010 Personal history of colonic polyps: Secondary | ICD-10-CM | POA: Diagnosis not present

## 2015-06-19 DIAGNOSIS — Z09 Encounter for follow-up examination after completed treatment for conditions other than malignant neoplasm: Secondary | ICD-10-CM | POA: Diagnosis present

## 2015-06-19 DIAGNOSIS — D123 Benign neoplasm of transverse colon: Secondary | ICD-10-CM | POA: Insufficient documentation

## 2015-06-19 HISTORY — DX: Polyp of colon: K63.5

## 2015-06-19 HISTORY — PX: COLONOSCOPY WITH PROPOFOL: SHX5780

## 2015-06-19 SURGERY — COLONOSCOPY WITH PROPOFOL
Anesthesia: Monitor Anesthesia Care

## 2015-06-19 MED ORDER — ONDANSETRON HCL 4 MG/2ML IJ SOLN
INTRAMUSCULAR | Status: AC
  Filled 2015-06-19: qty 2

## 2015-06-19 MED ORDER — LACTATED RINGERS IV SOLN
INTRAVENOUS | Status: DC
  Administered 2015-06-19: 1000 mL via INTRAVENOUS

## 2015-06-19 MED ORDER — PROPOFOL 10 MG/ML IV BOLUS
INTRAVENOUS | Status: AC
  Filled 2015-06-19: qty 20

## 2015-06-19 MED ORDER — ONDANSETRON HCL 4 MG/2ML IJ SOLN
INTRAMUSCULAR | Status: DC | PRN
Start: 2015-06-19 — End: 2015-06-19
  Administered 2015-06-19: 4 mg via INTRAVENOUS

## 2015-06-19 MED ORDER — PROPOFOL INFUSION 10 MG/ML OPTIME
INTRAVENOUS | Status: DC | PRN
  Administered 2015-06-19: 160 ug/kg/min via INTRAVENOUS

## 2015-06-19 MED ORDER — GLUCAGON HCL RDNA (DIAGNOSTIC) 1 MG IJ SOLR
INTRAMUSCULAR | Status: AC
  Filled 2015-06-19: qty 1

## 2015-06-19 MED ORDER — SODIUM CHLORIDE 0.9 % IV SOLN: INTRAVENOUS | Status: DC

## 2015-06-19 MED ORDER — GLUCAGON HCL RDNA (DIAGNOSTIC) 1 MG IJ SOLR
INTRAMUSCULAR | Status: DC | PRN
Start: 2015-06-19 — End: 2015-06-19
  Administered 2015-06-19: .5 mg via INTRAVENOUS

## 2015-06-19 MED ORDER — SODIUM CHLORIDE 0.9 % IV SOLN
INTRAVENOUS | Status: DC
Start: 2015-06-19 — End: 2015-06-19

## 2015-06-19 SURGICAL SUPPLY — 21 items

## 2015-06-19 NOTE — Anesthesia Procedure Notes (Signed)
Procedure Name: MAC Date/Time: 06/19/2015 11:48 AM Performed by: Carleene Cooper A Pre-anesthesia Checklist: Patient identified, Timeout performed, Emergency Drugs available, Suction available and Patient being monitored Patient Re-evaluated:Patient Re-evaluated prior to inductionOxygen Delivery Method: Simple face mask Dental Injury: Teeth and Oropharynx as per pre-operative assessment

## 2015-06-19 NOTE — Discharge Instructions (Signed)

## 2015-06-19 NOTE — Transfer of Care (Signed)
Immediate Anesthesia Transfer of Care Note  Patient: [name]  Procedure(s) Performed: Procedure(s): COLONOSCOPY WITH PROPOFOL (N/A)  Patient Location: PACU and Endoscopy Unit  Anesthesia Type:MAC  Level of Consciousness: awake, alert , oriented and patient cooperative  Airway & Oxygen Therapy: Patient Spontanous Breathing and Patient connected to face mask oxygen  Post-op Assessment: Report given to RN, Post -op Vital signs reviewed and stable and Patient moving all extremities  Post vital signs: Reviewed and stable  Last Vitals:  Filed Vitals:   06/19/15 1044  BP: 168/85  Pulse: 66  Temp: 36.6 C  Resp: 15    Complications: No apparent anesthesia complications

## 2015-06-19 NOTE — Anesthesia Preprocedure Evaluation (Addendum)
Anesthesia Evaluation  Patient identified by MRN, date of birth, ID band Patient awake    Reviewed: Allergy & Precautions, H&P , NPO status , Patient's Chart, lab work & pertinent test results  History of Anesthesia Complications (+) PONV  Airway Mallampati: II  TM Distance: >3 FB Neck ROM: full    Dental  (+) Caps, Dental Advisory Given,    Pulmonary asthma ,  Mild asthma breath sounds clear to auscultation  Pulmonary exam normal       Cardiovascular Exercise Tolerance: Good negative cardio ROS Normal cardiovascular examRhythm:regular Rate:Normal     Neuro/Psych Anxiety Panic disorder 2002Cervical fusion negative neurological ROS  negative psych ROS   GI/Hepatic negative GI ROS, Neg liver ROS,   Endo/Other  negative endocrine ROS  Renal/GU negative Renal ROS  negative genitourinary   Musculoskeletal   Abdominal   Peds  Hematology negative hematology ROS (+)   Anesthesia Other Findings   Reproductive/Obstetrics negative OB ROS                             Anesthesia Physical Anesthesia Plan  ASA: II  Anesthesia Plan: MAC   Post-op Pain Management:    Induction:   Airway Management Planned: Natural Airway and Simple Face Mask  Additional Equipment:   Intra-op Plan:   Post-operative Plan:   Informed Consent:   Plan Discussed with: Surgeon  Anesthesia Plan Comments:        Anesthesia Quick Evaluation

## 2015-06-19 NOTE — H&P (Signed)
  Procedure: Surveillance colonoscopy. 01/31/2010 normal surveillance colonoscopy performed. 1999 flexible proctosigmoidoscopy performed with removal of a neoplastic colon polyp. Father diagnosed with colon cancer after age 68. Maternal grandmother diagnosed with colon cancer.  History: The patient is a 69 year old male born 30-Sep-1946. He is scheduled to undergo a surveillance colonoscopy today.  Past medical history: Hypercholesterolemia. Asthma. Kidney stones. Cervical spine surgery. Knee surgery. Sinus surgery. Elbow surgery. Left shoulder surgery.  Medication allergies: None  Exam: The patient is alert and lying comfortably on the endoscopy stretcher. Abdomen is soft and nontender to palpation. Lungs are clear to auscultation. Cardiac exam reveals a regular rhythm.  Plan: Proceed with surveillance colonoscopy

## 2015-06-19 NOTE — Op Note (Signed)
Procedure: Surveillance colonoscopy. Normal surveillance colonoscopy performed on 01/31/2010. Neoplastic colon polyp removed in 1999 by flexible proctosigmoidoscopy. Father diagnosed with colon cancer after age 68. Paternal grandmother diagnosed with colon cancer  Endoscopist: Earle Gell  Premedication: Propofol administered by anesthesia  Procedure: Surveillance colonoscopy The patient was placed in the left lateral decubitus position. Anal inspection and digital rectal exam were normal. The Pentax pediatric colonoscope was introduced into the rectum and advanced to the cecum. A normal-appearing appendiceal orifice was identified. A normal-appearing ileocecal valve was identified. Colonic preparation for the exam today was poor. The patient did not perform a split dose prep as instructed. As anticipated, there was a thick film coating the cecum and ascending colon. Sessile polyps could have easily been missed in this area.  Rectum. Normal. Retroflex view of the distal rectum was normal  Sigmoid colon and descending colon. colonic diverticulosis  Splenic flexure. Normal  Transverse colon. From the mid transverse colon, a 3 mm sessile polyp was removed with the cold biopsy forceps  Hepatic flexure. Normal  Ascending colon. Normal  Cecum and ileocecal valve. From the distal cecum, a 7 mm sessile polyp was removed with the electrocautery snare and an Endo Clip was applied to the polypectomy site.  Assessment:  #1. Universal colonic diverticulosis  #2. A 7 mm sessile cecal polyp was removed with the electrocautery snare and a 3 mm sessile mid transverse colon polyp was removed with the cold biopsy forceps  #3. Poorly prepped right colon  Recommendation: Schedule repeat colonoscopy in one year

## 2015-06-19 NOTE — Anesthesia Postprocedure Evaluation (Signed)
  Anesthesia Post-op Note  Patient: [name]  Procedure(s) Performed: Procedure(s) (LRB): COLONOSCOPY WITH PROPOFOL (N/A)  Patient Location: PACU  Anesthesia Type: MAC  Level of Consciousness: awake and alert   Airway and Oxygen Therapy: Patient Spontanous Breathing  Post-op Pain: mild  Post-op Assessment: Post-op Vital signs reviewed, Patient's Cardiovascular Status Stable, Respiratory Function Stable, Patent Airway and No signs of Nausea or vomiting  Last Vitals:  Filed Vitals:   06/19/15 1255  BP:   Pulse: 56  Temp:   Resp: 19    Post-op Vital Signs: stable   Complications: No apparent anesthesia complications

## 2015-06-20 ENCOUNTER — Encounter (HOSPITAL_COMMUNITY): Payer: Self-pay | Admitting: Gastroenterology

## 2015-08-01 ENCOUNTER — Other Ambulatory Visit: Payer: Self-pay | Admitting: Family Medicine

## 2015-08-14 ENCOUNTER — Other Ambulatory Visit: Payer: Self-pay | Admitting: Urology

## 2015-08-14 ENCOUNTER — Observation Stay (HOSPITAL_COMMUNITY)
Admission: RE | Admit: 2015-08-14 | Discharge: 2015-08-15 | Disposition: A | Discharge status: Home / Self Care | Payer: Medicare Other | Source: Ambulatory Visit | Attending: Urology | Admitting: Urology

## 2015-08-14 ENCOUNTER — Encounter (HOSPITAL_COMMUNITY): Payer: Self-pay | Admitting: *Deleted

## 2015-08-14 ENCOUNTER — Encounter (HOSPITAL_COMMUNITY)
Admission: RE | Disposition: A | Discharge status: Home / Self Care | Payer: Self-pay | Source: Ambulatory Visit | Attending: Urology

## 2015-08-14 ENCOUNTER — Ambulatory Visit (HOSPITAL_COMMUNITY): Payer: Medicare Other | Admitting: Anesthesiology

## 2015-08-14 DIAGNOSIS — N201 Calculus of ureter: Secondary | ICD-10-CM | POA: Diagnosis present

## 2015-08-14 DIAGNOSIS — Z8049 Family history of malignant neoplasm of other genital organs: Secondary | ICD-10-CM | POA: Insufficient documentation

## 2015-08-14 DIAGNOSIS — E780 Pure hypercholesterolemia: Secondary | ICD-10-CM | POA: Insufficient documentation

## 2015-08-14 DIAGNOSIS — N200 Calculus of kidney: Secondary | ICD-10-CM

## 2015-08-14 DIAGNOSIS — F1021 Alcohol dependence, in remission: Secondary | ICD-10-CM | POA: Diagnosis not present

## 2015-08-14 DIAGNOSIS — M549 Dorsalgia, unspecified: Secondary | ICD-10-CM | POA: Diagnosis not present

## 2015-08-14 DIAGNOSIS — Z86010 Personal history of colonic polyps: Secondary | ICD-10-CM | POA: Diagnosis not present

## 2015-08-14 DIAGNOSIS — Z803 Family history of malignant neoplasm of breast: Secondary | ICD-10-CM | POA: Insufficient documentation

## 2015-08-14 DIAGNOSIS — Z800 Family history of malignant neoplasm of digestive organs: Secondary | ICD-10-CM | POA: Diagnosis not present

## 2015-08-14 DIAGNOSIS — N132 Hydronephrosis with renal and ureteral calculous obstruction: Principal | ICD-10-CM | POA: Insufficient documentation

## 2015-08-14 DIAGNOSIS — Z87442 Personal history of urinary calculi: Secondary | ICD-10-CM | POA: Insufficient documentation

## 2015-08-14 DIAGNOSIS — Z981 Arthrodesis status: Secondary | ICD-10-CM | POA: Insufficient documentation

## 2015-08-14 DIAGNOSIS — J45909 Unspecified asthma, uncomplicated: Secondary | ICD-10-CM | POA: Insufficient documentation

## 2015-08-14 DIAGNOSIS — N323 Diverticulum of bladder: Secondary | ICD-10-CM | POA: Insufficient documentation

## 2015-08-14 DIAGNOSIS — M1990 Unspecified osteoarthritis, unspecified site: Secondary | ICD-10-CM | POA: Insufficient documentation

## 2015-08-14 DIAGNOSIS — F419 Anxiety disorder, unspecified: Secondary | ICD-10-CM | POA: Diagnosis not present

## 2015-08-14 DIAGNOSIS — M5136 Other intervertebral disc degeneration, lumbar region: Secondary | ICD-10-CM | POA: Insufficient documentation

## 2015-08-14 DIAGNOSIS — G8929 Other chronic pain: Secondary | ICD-10-CM | POA: Insufficient documentation

## 2015-08-14 HISTORY — PX: CYSTOSCOPY W/ URETERAL STENT PLACEMENT: SHX1429

## 2015-08-14 HISTORY — PX: CYSTOSCOPY WITH RETROGRADE PYELOGRAM, URETEROSCOPY AND STENT PLACEMENT: SHX5789

## 2015-08-14 HISTORY — PX: HOLMIUM LASER APPLICATION: SHX5852

## 2015-08-14 LAB — BASIC METABOLIC PANEL
Anion gap: 6 (ref 5–15)
BUN: 20 mg/dL (ref 6–20)
CO2: 27 mmol/L (ref 22–32)
Calcium: 8.7 mg/dL — ABNORMAL LOW (ref 8.9–10.3)
Chloride: 102 mmol/L (ref 101–111)
Creatinine, Ser: 2.04 mg/dL — ABNORMAL HIGH (ref 0.61–1.24)
GFR calc Af Amer: 37 mL/min — ABNORMAL LOW (ref >60)
GFR calc non Af Amer: 32 mL/min — ABNORMAL LOW (ref >60)
GLUCOSE: 102 mg/dL — AB (ref 65–99)
POTASSIUM: 3.7 mmol/L (ref 3.5–5.1)
Sodium: 135 mmol/L (ref 135–145)

## 2015-08-14 SURGERY — CYSTOURETEROSCOPY, WITH RETROGRADE PYELOGRAM AND STENT INSERTION
Anesthesia: General | Site: Ureter | Laterality: Right

## 2015-08-14 MED ORDER — 0.9 % SODIUM CHLORIDE (POUR BTL) OPTIME
TOPICAL | Status: DC | PRN
  Administered 2015-08-14: 1000 mL

## 2015-08-14 MED ORDER — DOCUSATE SODIUM 100 MG PO CAPS
100.0000 mg | ORAL_CAPSULE | Freq: Two times a day (BID) | ORAL | Status: DC
  Administered 2015-08-14 – 2015-08-15 (×2): 100 mg via ORAL
  Filled 2015-08-14 (×2): qty 1

## 2015-08-14 MED ORDER — OXYBUTYNIN CHLORIDE 5 MG PO TABS: 5.0000 mg | ORAL_TABLET | Freq: Three times a day (TID) | ORAL | Status: DC | PRN

## 2015-08-14 MED ORDER — DOCUSATE SODIUM 100 MG PO CAPS: 100.0000 mg | ORAL_CAPSULE | Freq: Two times a day (BID) | ORAL | Status: DC

## 2015-08-14 MED ORDER — SODIUM CHLORIDE 0.9 % IR SOLN
Status: DC | PRN
  Administered 2015-08-14: 4000 mL

## 2015-08-14 MED ORDER — TAMSULOSIN HCL 0.4 MG PO CAPS: 0.4000 mg | ORAL_CAPSULE | Freq: Every day | ORAL | Status: DC

## 2015-08-14 MED ORDER — ALPRAZOLAM 0.25 MG PO TABS: 0.2500 mg | ORAL_TABLET | Freq: Three times a day (TID) | ORAL | Status: DC | PRN

## 2015-08-14 MED ORDER — HYDROCODONE-ACETAMINOPHEN 5-325 MG PO TABS: 1.0000 | ORAL_TABLET | Freq: Four times a day (QID) | ORAL | Status: DC | PRN

## 2015-08-14 MED ORDER — FLUTICASONE PROPIONATE 50 MCG/ACT NA SUSP
2.0000 | Freq: Every day | NASAL | Status: DC
  Administered 2015-08-15: 2 via NASAL
  Filled 2015-08-14: qty 16

## 2015-08-14 MED ORDER — MORPHINE SULFATE (PF) 2 MG/ML IV SOLN: 2.0000 mg | INTRAVENOUS | Status: DC | PRN

## 2015-08-14 MED ORDER — FENTANYL CITRATE (PF) 100 MCG/2ML IJ SOLN: 25.0000 ug | INTRAMUSCULAR | Status: DC | PRN

## 2015-08-14 MED ORDER — FENTANYL CITRATE (PF) 100 MCG/2ML IJ SOLN
INTRAMUSCULAR | Status: DC | PRN
  Administered 2015-08-14 (×3): 50 ug via INTRAVENOUS

## 2015-08-14 MED ORDER — CIPROFLOXACIN IN D5W 400 MG/200ML IV SOLN
INTRAVENOUS | Status: AC
  Filled 2015-08-14: qty 200

## 2015-08-14 MED ORDER — BUDESONIDE 0.25 MG/2ML IN SUSP
0.2500 mg | Freq: Two times a day (BID) | RESPIRATORY_TRACT | Status: DC
  Administered 2015-08-14 – 2015-08-15 (×2): 0.25 mg via RESPIRATORY_TRACT
  Filled 2015-08-14 (×3): qty 2

## 2015-08-14 MED ORDER — ATROPINE SULFATE 0.1 MG/ML IJ SOLN
INTRAMUSCULAR | Status: AC
  Filled 2015-08-14: qty 10

## 2015-08-14 MED ORDER — ALBUTEROL SULFATE (2.5 MG/3ML) 0.083% IN NEBU: 2.5000 mg | INHALATION_SOLUTION | Freq: Four times a day (QID) | RESPIRATORY_TRACT | Status: DC | PRN

## 2015-08-14 MED ORDER — CIPROFLOXACIN IN D5W 400 MG/200ML IV SOLN
400.0000 mg | INTRAVENOUS | Status: AC
  Administered 2015-08-14: 400 mg via INTRAVENOUS

## 2015-08-14 MED ORDER — OXYBUTYNIN CHLORIDE 5 MG PO TABS
5.0000 mg | ORAL_TABLET | Freq: Three times a day (TID) | ORAL | Status: DC | PRN
  Administered 2015-08-14: 5 mg via ORAL
  Filled 2015-08-14: qty 1

## 2015-08-14 MED ORDER — LIDOCAINE HCL (CARDIAC) 20 MG/ML IV SOLN
INTRAVENOUS | Status: DC | PRN
  Administered 2015-08-14: 100 mg via INTRAVENOUS

## 2015-08-14 MED ORDER — FENTANYL CITRATE (PF) 100 MCG/2ML IJ SOLN
INTRAMUSCULAR | Status: AC
  Filled 2015-08-14: qty 4

## 2015-08-14 MED ORDER — PAROXETINE HCL 20 MG PO TABS
40.0000 mg | ORAL_TABLET | Freq: Every morning | ORAL | Status: DC
  Administered 2015-08-15: 40 mg via ORAL
  Filled 2015-08-14: qty 2

## 2015-08-14 MED ORDER — ONDANSETRON HCL 4 MG/2ML IJ SOLN: 4.0000 mg | Freq: Once | INTRAMUSCULAR | Status: DC | PRN

## 2015-08-14 MED ORDER — SODIUM CHLORIDE 0.9 % IV SOLN
INTRAVENOUS | Status: DC
  Administered 2015-08-14 (×2): via INTRAVENOUS

## 2015-08-14 MED ORDER — LIDOCAINE HCL (CARDIAC) 20 MG/ML IV SOLN
INTRAVENOUS | Status: AC
  Filled 2015-08-14: qty 5

## 2015-08-14 MED ORDER — SENNA 8.6 MG PO TABS
1.0000 | ORAL_TABLET | Freq: Two times a day (BID) | ORAL | Status: DC
  Administered 2015-08-14 – 2015-08-15 (×2): 8.6 mg via ORAL
  Filled 2015-08-14 (×2): qty 1

## 2015-08-14 MED ORDER — ONDANSETRON HCL 4 MG/2ML IJ SOLN
INTRAMUSCULAR | Status: AC
  Filled 2015-08-14: qty 2

## 2015-08-14 MED ORDER — LACTATED RINGERS IV SOLN
INTRAVENOUS | Status: DC | PRN
  Administered 2015-08-14: 20:00:00 via INTRAVENOUS

## 2015-08-14 MED ORDER — PROPOFOL 10 MG/ML IV BOLUS
INTRAVENOUS | Status: AC
  Filled 2015-08-14: qty 20

## 2015-08-14 MED ORDER — TAMSULOSIN HCL 0.4 MG PO CAPS
0.4000 mg | ORAL_CAPSULE | Freq: Every day | ORAL | Status: DC
  Administered 2015-08-14 – 2015-08-15 (×2): 0.4 mg via ORAL
  Filled 2015-08-14 (×2): qty 1

## 2015-08-14 MED ORDER — HYDROMORPHONE HCL 1 MG/ML IJ SOLN
1.0000 mg | INTRAMUSCULAR | Status: DC | PRN
  Administered 2015-08-14 – 2015-08-15 (×3): 1 mg via INTRAVENOUS
  Filled 2015-08-14 (×3): qty 1

## 2015-08-14 MED ORDER — ATROPINE SULFATE 0.4 MG/ML IJ SOLN
INTRAMUSCULAR | Status: DC | PRN
  Administered 2015-08-14: 0.4 mg via INTRAVENOUS

## 2015-08-14 MED ORDER — LACTATED RINGERS IV SOLN: INTRAVENOUS | Status: DC

## 2015-08-14 MED ORDER — IOHEXOL 300 MG/ML  SOLN
INTRAMUSCULAR | Status: DC | PRN
  Administered 2015-08-14: 40 mL via URETHRAL

## 2015-08-14 MED ORDER — DEXAMETHASONE SODIUM PHOSPHATE 10 MG/ML IJ SOLN
INTRAMUSCULAR | Status: DC | PRN
  Administered 2015-08-14: 10 mg via INTRAVENOUS

## 2015-08-14 MED ORDER — ONDANSETRON HCL 4 MG/2ML IJ SOLN
INTRAMUSCULAR | Status: DC | PRN
  Administered 2015-08-14: 4 mg via INTRAVENOUS

## 2015-08-14 MED ORDER — LEVOCETIRIZINE DIHYDROCHLORIDE 5 MG PO TABS: 5.0000 mg | ORAL_TABLET | Freq: Every day | ORAL | Status: DC | PRN

## 2015-08-14 MED ORDER — PROPOFOL 10 MG/ML IV BOLUS
INTRAVENOUS | Status: DC | PRN
  Administered 2015-08-14: 200 mg via INTRAVENOUS

## 2015-08-14 MED ORDER — HYDROCODONE-ACETAMINOPHEN 5-325 MG PO TABS
1.0000 | ORAL_TABLET | ORAL | Status: DC | PRN
  Administered 2015-08-15 (×2): 1 via ORAL
  Filled 2015-08-14: qty 1
  Filled 2015-08-14: qty 2
  Filled 2015-08-14: qty 1

## 2015-08-14 MED ORDER — LORATADINE 10 MG PO TABS: 10.0000 mg | ORAL_TABLET | Freq: Every day | ORAL | Status: DC | PRN

## 2015-08-14 MED ORDER — ADULT MULTIVITAMIN W/MINERALS CH
1.0000 | ORAL_TABLET | Freq: Every day | ORAL | Status: DC
  Administered 2015-08-15: 1 via ORAL
  Filled 2015-08-14: qty 1

## 2015-08-14 MED ORDER — ONDANSETRON HCL 4 MG/2ML IJ SOLN: 4.0000 mg | INTRAMUSCULAR | Status: DC | PRN

## 2015-08-14 SURGICAL SUPPLY — 23 items
BASKET LASER NITINOL 1.9FR (BASKET) IMPLANT
BASKET STNLS GEMINI 4WIRE 3FR (BASKET) IMPLANT
BASKET ZERO TIP NITINOL 2.4FR (BASKET) ×4 IMPLANT
CATH IMAGER II 65CM (CATHETERS) ×4 IMPLANT
CATH INTERMIT  6FR 70CM (CATHETERS) ×4 IMPLANT
CLOTH BEACON ORANGE TIMEOUT ST (SAFETY) IMPLANT
ELECT REM PT RETURN 9FT ADLT (ELECTROSURGICAL)
ELECTRODE REM PT RTRN 9FT ADLT (ELECTROSURGICAL) IMPLANT
FIBER LASER FLEXIVA 1000 (UROLOGICAL SUPPLIES) IMPLANT
FIBER LASER FLEXIVA 200 (UROLOGICAL SUPPLIES) ×4 IMPLANT
FIBER LASER FLEXIVA 365 (UROLOGICAL SUPPLIES) IMPLANT
FIBER LASER FLEXIVA 550 (UROLOGICAL SUPPLIES) IMPLANT
FIBER LASER TRAC TIP (UROLOGICAL SUPPLIES) IMPLANT
GLOVE BIOGEL M STRL SZ7.5 (GLOVE) ×8 IMPLANT
GOWN STRL REUS W/TWL XL LVL3 (GOWN DISPOSABLE) ×8 IMPLANT
GUIDEWIRE ANG ZIPWIRE 038X150 (WIRE) ×4 IMPLANT
GUIDEWIRE STR DUAL SENSOR (WIRE) ×4 IMPLANT
IV NS IRRIG 3000ML ARTHROMATIC (IV SOLUTION) ×4 IMPLANT
PACK CYSTO (CUSTOM PROCEDURE TRAY) ×4 IMPLANT
STENT POLARIS 5FRX26 (STENTS) ×8 IMPLANT
SYRINGE 10CC LL (SYRINGE) ×4 IMPLANT
SYRINGE IRR TOOMEY STRL 70CC (SYRINGE) IMPLANT
TUBE FEEDING 8FR 16IN STR KANG (MISCELLANEOUS) ×4 IMPLANT

## 2015-08-14 NOTE — Transfer of Care (Signed)
Immediate Anesthesia Transfer of Care Note  Patient: [name]  Procedure(s) Performed: Procedure(s): CYSTOSCOPY WITH RETROGRADE PYELOGRAM, DIGITAL URETEROSCOPY AND STENT PLACEMENT (Right) HOLMIUM LASER APPLICATION (Right) CYSTOSCOPY WITH RETROGRADE PYELOGRAM/URETERAL STENT PLACEMENT (Left)  Patient Location: PACU  Anesthesia Type:General  Level of Consciousness: awake, alert  and oriented  Airway & Oxygen Therapy: Patient Spontanous Breathing and Patient connected to face mask oxygen  Post-op Assessment: Report given to RN and Post -op Vital signs reviewed and stable  Post vital signs: Reviewed and stable  Last Vitals:  Filed Vitals:   08/14/15 1556  BP: 131/68  Pulse: 64  Temp: 36.5 C  Resp: 18    Complications: No apparent anesthesia complications

## 2015-08-14 NOTE — Progress Notes (Signed)
RN received the patient from PACU, accompanied by Romie Minus.

## 2015-08-14 NOTE — Anesthesia Preprocedure Evaluation (Signed)
Anesthesia Evaluation  Patient identified by MRN, date of birth, ID band Patient awake    Reviewed: Allergy & Precautions, H&P , NPO status , Patient's Chart, lab work & pertinent test results  History of Anesthesia Complications (+) PONV and history of anesthetic complications  Airway Mallampati: III  TM Distance: <3 FB Neck ROM: Full    Dental  (+) Caps, Dental Advisory Given   Pulmonary asthma ,  breath sounds clear to auscultation  Pulmonary exam normal       Cardiovascular Exercise Tolerance: Good - angina- Past MI negative cardio ROS Normal cardiovascular examRhythm:Regular Rate:Normal     Neuro/Psych PSYCHIATRIC DISORDERS Anxiety negative neurological ROS     GI/Hepatic negative GI ROS, Neg liver ROS,   Endo/Other  negative endocrine ROS  Renal/GU nephrolithiasis  negative genitourinary   Musculoskeletal  (+) Arthritis -, Osteoarthritis,  S/p 3 level ACDF   Abdominal   Peds negative pediatric ROS (+)  Hematology negative hematology ROS (+)   Anesthesia Other Findings   Reproductive/Obstetrics negative OB ROS                             Anesthesia Physical  Anesthesia Plan  ASA: II  Anesthesia Plan: General   Post-op Pain Management:    Induction: Intravenous  Airway Management Planned: LMA  Additional Equipment:   Intra-op Plan:   Post-operative Plan:   Informed Consent: I have reviewed the patients History and Physical, chart, labs and discussed the procedure including the risks, benefits and alternatives for the proposed anesthesia with the patient or authorized representative who has indicated his/her understanding and acceptance.   Dental advisory given  Plan Discussed with: CRNA and Surgeon  Anesthesia Plan Comments:         Anesthesia Quick Evaluation

## 2015-08-14 NOTE — Progress Notes (Signed)
Paged Dr Tresa Moore about patient's pain. Order received for IV fluids and pain medications.

## 2015-08-14 NOTE — H&P (Signed)
[name] is an 69 y.o. male.    Chief Complaint: Pre-Op Cysto Bilateral Stent Placement, Possible Rt Ureteroscopy  / Laser LIthotripsy  HPI:   1 - Bilateral Ureteral Stones - Rt 68mm proximal with mild hydro as well as left 64mm proximal with severl left renal sotnes and mod-severe hydro by CT 5/36 on eval colickly flank pain in setting of prior renal colic.  Still voiding.  Today [name] is seen to proceed with cysto and bilateral stent placement, possible right ureteroscopic stone manipulation.   Past Medical History  Diagnosis Date  . Panic disorder 2002  . Pure hypercholesterolemia   . DDD (degenerative disc disease), lumbar   . Anxiety   . History of alcohol abuse     QUIT 1993  . Mild asthma   . History of kidney stones   . Right ureteral stone   . DJD (degenerative joint disease)   . History of diverticulosis   . Renal calculus     RIGHT LOWER POLE  . Chronic back pain   . PONV (postoperative nausea and vomiting)   . Renal stone 08/04/2013  . Colon polyps 05/2015    tubular adenoma; diverticulosis; poor prep--repeat 1 year (Dr. Wynetta Emery)    Past Surgical History  Procedure Laterality Date  . Appendectomy  age 64  . Tonsillectomy and adenoidectomy  age 14  . Left heel spur surgery  1989  . Nasal polyp surgery  1993,repeat 2006  . Elbow surgery Right 1995  &   1996  . Laparoscopic cholecystectomy  1998  . Total hip arthroplasty Left 04/26/2010    Dr. Wynelle Link  . Total hip revision  12/01/2011    Procedure: TOTAL HIP REVISION;  Surgeon: Dione Plover Aluisio;  Location: [facility];  Service: Orthopedics;  Laterality: Left;  . Excision/release bursa hip  10/26/2012    Procedure: EXCISION/RELEASE BURSA HIP;  Surgeon: Gearlean Alf, MD;  Location: [facility];  Service: Orthopedics;  Laterality: Left;  Left Hip Bursectomy with Tendon Repair  . Tendon repair  10/26/2012    Procedure: TENDON REPAIR;  Surgeon: Gearlean Alf, MD;  Location: [facility];  Service: Orthopedics;  Laterality:  Left;  . Knee arthroscopy w/ meniscectomy  [place]  . Cervical fusion  1993  . Left ureteroscopic stone extraction  04-17-2009  . Right ureteroscopic holium lasertripsy/ stent placement  05-15-2011  . Shoulder arthroscopy with rotator cuff repair and subacromial decompression Left 10-05-2008    AND DEBRIDEMENT LABRAL  . Closed left shoulder manipulation/ excision lipoma  02-15-2009  . Cystoscopy with retrograde pyelogram, ureteroscopy and stent placement Right 08/04/2013    Procedure: [place];  Surgeon: Malka So, MD;  Location: [facility];  Service: Urology;  Laterality: Right;  URETEROSCOPY WITH LASER APPLICATION  . Joint replacement    . Colonoscopy with propofol N/A 06/19/2015    Procedure: COLONOSCOPY WITH PROPOFOL;  Surgeon: Garlan Fair, MD;  Location: [facility];  Service: Endoscopy;  Laterality: N/A;    Family History  Problem Relation Age of Onset  . Hypertension Mother   . Anxiety disorder Mother   . Cancer Mother     male cancer  . Cancer Father     colon  . Colon cancer Father   . Panic disorder Daughter   . Diverticulosis Daughter   . Cancer Maternal Grandmother     breast and stomach  . Heart disease Maternal Grandmother   . Cancer Paternal Grandmother  87    colon cancer  . Colon cancer Paternal Grandmother   . Alcohol abuse Brother   . Diabetes Neg Hx    Social History:  reports that he has never smoked. He has never used smokeless tobacco. He reports that he does not drink alcohol or use illicit drugs.  Allergies: No Known Allergies  No prescriptions prior to admission    No results found for this or any previous visit (from the past 48 hour(s)). No results found.  Review of Systems  Constitutional: Negative.  Negative for fever and chills.  HENT: Negative.   Eyes: Negative.   Respiratory: Negative.   Cardiovascular: Negative.   Gastrointestinal: Negative.   Genitourinary: Positive for urgency and flank pain.   Musculoskeletal: Negative.   Skin: Negative.   Neurological: Negative.   Endo/Heme/Allergies: Negative.   Psychiatric/Behavioral: Negative.     There were no vitals taken for this visit. Physical Exam  Constitutional: He appears well-developed.  HENT:  Head: Normocephalic.  Eyes: Pupils are equal, round, and reactive to light.  Neck: Normal range of motion.  Cardiovascular: Normal rate.   Respiratory: Effort normal.  GI: Soft.  Genitourinary:  Moderate bilateral CVAT  Musculoskeletal: Normal range of motion.  Neurological: He is alert.  Skin: Skin is warm.  Psychiatric: He has a normal mood and affect. His behavior is normal. Judgment and thought content normal.     Assessment/Plan  1 - Bilateral Ureteral Stones - proceed as planned with urgent bilateral renal decompression with JJ stents, observe in house post-op to monitor for post-obstructive diuresis / serial labs. Will attempt right sided laser lithotripsy as long as anatomy and clinical situaion favorable. HE understands this will be staged approach.  Risks, benefits, alternatives discussed in office by my collegue Dr. Jeffie Pollock and reitterated by me.    Kim Oki 08/14/2015, 12:52 PM

## 2015-08-14 NOTE — Anesthesia Procedure Notes (Signed)
Procedure Name: LMA Insertion Date/Time: 08/14/2015 7:02 PM Performed by: Danley Danker L Patient Re-evaluated:Patient Re-evaluated prior to inductionOxygen Delivery Method: Circle system utilized Preoxygenation: Pre-oxygenation with 100% oxygen Intubation Type: IV induction Ventilation: Mask ventilation without difficulty LMA: LMA inserted LMA Size: 4.0 Number of attempts: 1 Placement Confirmation: positive ETCO2 and breath sounds checked- equal and bilateral Tube secured with: Tape Dental Injury: Teeth and Oropharynx as per pre-operative assessment

## 2015-08-14 NOTE — Brief Op Note (Signed)
08/14/2015  8:02 PM  PATIENT:  [name]  69 y.o. male  PRE-OPERATIVE DIAGNOSIS:  bilateral proximal stones with obstruction  POST-OPERATIVE DIAGNOSIS:  bilateral proximal stones with obstruction  PROCEDURE:  Procedure(s): CYSTOSCOPY WITH RETROGRADE PYELOGRAM, DIGITAL URETEROSCOPY AND STENT PLACEMENT (Right) HOLMIUM LASER APPLICATION (Right) CYSTOSCOPY WITH RETROGRADE PYELOGRAM/URETERAL STENT PLACEMENT (Left)  SURGEON:  Surgeon(s) and Role:    * Alexis Frock, MD - Primary  PHYSICIAN ASSISTANT:   ASSISTANTS: Verdis Frederickson MD   ANESTHESIA:   general  EBL:     BLOOD ADMINISTERED:none  DRAINS: none   LOCAL MEDICATIONS USED:  NONE  SPECIMEN:  No Specimen  DISPOSITION OF SPECIMEN:  N/A  COUNTS:  YES  TOURNIQUET:  * No tourniquets in log *  DICTATION: .Other Dictation: Dictation Number  [id number]  PLAN OF CARE: Admit for overnight observation  PATIENT DISPOSITION:  PACU - hemodynamically stable.   Delay start of Pharmacological VTE agent (>24hrs) due to surgical blood loss or risk of bleeding: yes

## 2015-08-14 NOTE — Anesthesia Postprocedure Evaluation (Signed)
  Anesthesia Post-op Note  Patient: [name]  Procedure(s) Performed: Procedure(s) (LRB): CYSTOSCOPY WITH RETROGRADE PYELOGRAM, DIGITAL URETEROSCOPY AND STENT PLACEMENT (Right) HOLMIUM LASER APPLICATION (Right) CYSTOSCOPY WITH RETROGRADE PYELOGRAM/URETERAL STENT PLACEMENT (Left)  Patient Location: PACU  Anesthesia Type: General  Level of Consciousness: awake and alert   Airway and Oxygen Therapy: Patient Spontanous Breathing  Post-op Pain: mild  Post-op Assessment: Post-op Vital signs reviewed, Patient's Cardiovascular Status Stable, Respiratory Function Stable, Patent Airway and No signs of Nausea or vomiting  Last Vitals:  Filed Vitals:   08/14/15 1556  BP: 131/68  Pulse: 64  Temp: 36.5 C  Resp: 18    Post-op Vital Signs: stable   Complications: No apparent anesthesia complications

## 2015-08-14 NOTE — Discharge Instructions (Signed)
1 - You may have urinary urgency (bladder spasms) and bloody urine on / off with stent in place. This is normal. ° °2 - Call MD or go to ER for fever >102, severe pain / nausea / vomiting not relieved by medications, or acute change in medical status ° °

## 2015-08-15 ENCOUNTER — Other Ambulatory Visit: Payer: Self-pay | Admitting: Urology

## 2015-08-15 ENCOUNTER — Encounter (HOSPITAL_COMMUNITY): Payer: Self-pay | Admitting: Urology

## 2015-08-15 DIAGNOSIS — N135 Crossing vessel and stricture of ureter without hydronephrosis: Secondary | ICD-10-CM

## 2015-08-15 DIAGNOSIS — N132 Hydronephrosis with renal and ureteral calculous obstruction: Secondary | ICD-10-CM | POA: Diagnosis not present

## 2015-08-15 LAB — BASIC METABOLIC PANEL
ANION GAP: 3 — AB (ref 5–15)
BUN: 23 mg/dL — ABNORMAL HIGH (ref 6–20)
CALCIUM: 8.7 mg/dL — AB (ref 8.9–10.3)
CO2: 27 mmol/L (ref 22–32)
Chloride: 108 mmol/L (ref 101–111)
Creatinine, Ser: 1.84 mg/dL — ABNORMAL HIGH (ref 0.61–1.24)
GFR, EST AFRICAN AMERICAN: 42 mL/min — AB (ref >60)
GFR, EST NON AFRICAN AMERICAN: 36 mL/min — AB (ref >60)
GLUCOSE: 155 mg/dL — AB (ref 65–99)
Potassium: 4.8 mmol/L (ref 3.5–5.1)
SODIUM: 138 mmol/L (ref 135–145)

## 2015-08-15 MED ORDER — PHENAZOPYRIDINE HCL 200 MG PO TABS: 200.0000 mg | ORAL_TABLET | Freq: Three times a day (TID) | ORAL | Status: DC | PRN

## 2015-08-15 NOTE — Progress Notes (Signed)
1 Day Post-Op Subjective: NAEON. Some stent related discomfort. Pain controlled. Creatinine down-trending as expected.  Objective: Vital signs in last 24 hours: Temp:  [97.7 F (36.5 C)-98.8 F (37.1 C)] 97.8 F (36.6 C) (08/24 0435) Pulse Rate:  [55-87] 58 (08/24 0435) Resp:  [13-18] 15 (08/24 0435) BP: (121-153)/(67-81) 138/67 mmHg (08/24 0435) SpO2:  [95 %-100 %] 100 % (08/24 0435) Weight:  [91.797 kg (202 lb 6 oz)] 91.797 kg (202 lb 6 oz) (08/23 1556)  Intake/Output from previous day: 08/23 0701 - 08/24 0700 In: 2438.3 [P.O.:360; I.V.:2078.3] Out: 775 [Urine:775] Intake/Output this shift: Total I/O In: 2363.3 [P.O.:360; I.V.:2003.3] Out: 775 [Urine:775]  Physical Exam:  General: Alert and oriented CV: RRR Lungs: Clear Abdomen: Soft, ND Incisions: Ext: NT, No erythema  Lab Results: No results for input(s): HGB, HCT in the last 72 hours. BMET  Recent Labs  08/14/15 1630 08/15/15 0431  NA 135 138  K 3.7 4.8  CL 102 108  CO2 27 27  GLUCOSE 102* 155*  BUN 20 23*  CREATININE 2.04* 1.84*  CALCIUM 8.7* 8.7*     Studies/Results: No results found.  Assessment/Plan: 68yM with bilateral nephrolithiasis s/p RIGHT USE, bilateral stent placement 08/14/15. AKI improving with Cr down to 1.84 from 2.04.  -Saline lock IV -Flomax and PO pain meds for stent symptoms -Plan for likely discharge later this morning     Star Age 08/15/2015, 6:52 AM

## 2015-08-15 NOTE — Discharge Summary (Signed)
Physician Discharge Summary  Patient ID: Tamon Parkerson MRN: [id number] DOB/AGE: July 07, 1946 69 y.o.  Admit date: 08/14/2015 Discharge date: 08/15/2015  Admission Diagnoses:  Right ureteral stone and left ureteral stone with bilateral hydro and renal insufficiency  Discharge Diagnoses:  Principal Problem:   Right ureteral stone Active Problems:   Nephrolithiasis Bilateral hydro with renal insufficiency  Past Medical History  Diagnosis Date  . Panic disorder 2002  . Pure hypercholesterolemia   . DDD (degenerative disc disease), lumbar   . Anxiety   . History of alcohol abuse     QUIT 1993  . Mild asthma   . History of kidney stones   . Right ureteral stone   . DJD (degenerative joint disease)   . History of diverticulosis   . Renal calculus     RIGHT LOWER POLE  . Chronic back pain   . PONV (postoperative nausea and vomiting)   . Renal stone 08/04/2013  . Colon polyps 05/2015    tubular adenoma; diverticulosis; poor prep--repeat 1 year (Dr. Wynetta Emery)    Surgeries: Procedure(s): CYSTOSCOPY WITH RETROGRADE PYELOGRAM, DIGITAL URETEROSCOPY AND STENT PLACEMENT HOLMIUM LASER APPLICATION CYSTOSCOPY WITH RETROGRADE PYELOGRAM/URETERAL STENT PLACEMENT on 08/14/2015  Bilatera 5 fr polaris stents were placed.  Consultants (if any):    Discharged Condition: Improved  Hospital Course: Dariyon Urquilla is an 69 y.o. male who was admitted 08/14/2015 with a diagnosis of Right ureteral stone and left ureteral stone with bilateral hydro and renal insufficiency and went to the operating room on 08/14/2015 and underwent the above named procedures.  His preop Cr was 2.05 and it was down to 1.84 this morning.  He is tolerating his stents well.     He was given perioperative antibiotics:      Anti-infectives    Start     Dose/Rate Route Frequency Ordered Stop   08/14/15 1550  ciprofloxacin (CIPRO) IVPB 400 mg     400 mg 200 mL/hr over 60 Minutes Intravenous 60 min pre-op 08/14/15  1550 08/14/15 2005    .  He was given sequential compression devices  for DVT prophylaxis.  He benefited maximally from the hospital stay and there were no complications.    Recent vital signs:  Filed Vitals:   08/15/15 0435  BP: 138/67  Pulse: 58  Temp: 97.8 F (36.6 C)  Resp: 15    Recent laboratory studies:  Lab Results  Component Value Date   HGB 14.5 02/12/2015   HGB 14.9 02/09/2014   HGB 13.0 08/04/2013   Lab Results  Component Value Date   WBC 4.7 02/12/2015   PLT 206 02/12/2015   Lab Results  Component Value Date   INR 1.08 11/27/2011   Lab Results  Component Value Date   NA 138 08/15/2015   K 4.8 08/15/2015   CL 108 08/15/2015   CO2 27 08/15/2015   BUN 23* 08/15/2015   CREATININE 1.84* 08/15/2015   GLUCOSE 155* 08/15/2015    Discharge Medications:     Medication List    TAKE these medications        acetaminophen 500 MG tablet  Commonly known as:  TYLENOL  Take 500 mg by mouth every 6 (six) hours as needed. For pain     albuterol 108 (90 BASE) MCG/ACT inhaler  Commonly known as:  PROVENTIL HFA;VENTOLIN HFA  Inhale 2 puffs into the lungs every 6 (six) hours as needed.     ALPRAZolam 0.5 MG tablet  Commonly known as:  Duanne Moron  Take 0.5-1 tablets (0.25-0.5 mg total) by mouth 3 (three) times daily as needed for anxiety or sleep.     clindamycin 300 MG capsule  Commonly known as:  CLEOCIN  Take 300 mg by mouth 2 (two) times daily.     docusate sodium 100 MG capsule  Commonly known as:  COLACE  Take 1 capsule (100 mg total) by mouth 2 (two) times daily.     FLOVENT HFA 110 MCG/ACT inhaler  Generic drug:  fluticasone  INHALE 1 PUFF INTO THE LUNGS 2 (TWO) TIMES DAILY.     fluticasone 50 MCG/ACT nasal spray  Commonly known as:  FLONASE  USE 2 SPRAYS IN EACH NOSTRIL ONCE DAILY     HYDROcodone-acetaminophen 5-325 MG per tablet  Commonly known as:  NORCO/VICODIN  Take 1-2 tablets by mouth every 6 (six) hours as needed.     levocetirizine 5  MG tablet  Commonly known as:  XYZAL  TAKE 1 TABLET (5 MG TOTAL) BY MOUTH DAILY WITH BREAKFAST.     multivitamin with minerals Tabs tablet  Take 1 tablet by mouth daily.     oxybutynin 5 MG tablet  Commonly known as:  DITROPAN  Take 1 tablet (5 mg total) by mouth 3 (three) times daily as needed for bladder spasms.     PARoxetine 40 MG tablet  Commonly known as:  PAXIL  TAKE 1 TABLET EVERY MORNING     phenazopyridine 200 MG tablet  Commonly known as:  PYRIDIUM  Take 1 tablet (200 mg total) by mouth 3 (three) times daily as needed for pain.     tamsulosin 0.4 MG Caps capsule  Commonly known as:  FLOMAX  Take 1 capsule (0.4 mg total) by mouth at bedtime.        Diagnostic Studies: No results found.  Disposition: 01-Home or Self Care  Discharge Instructions    Discontinue IV    Complete by:  As directed            Follow-up Information    Follow up with Malka So, MD.   Specialty:  Urology   Why:  call to confirm date and time   Contact information:   [address] [place] [address] [phone]      I discussed the options for treating the left UPJ stone which is 13x33mm, including ureteroscopy with laser and left PCNL.   He is most interested in PCNL.  I reviewed the risks of bleeding, infection, injury to the kidney and adjacent structures, urine leak, AV fistula, need for secondary procedures, thrombotic events and anesthetic complications.   I will try to get that set up in 1-2 weeks and will remove his stents at that time.   SignedMalka So 08/15/2015, 8:22 AM

## 2015-08-15 NOTE — Progress Notes (Signed)
Utilization review completed.  

## 2015-08-15 NOTE — Op Note (Signed)
NAME:  [name], [name]           ACCOUNT NO.:  [id number]  MEDICAL RECORD NO.:  [id number]  LOCATION:  3007                         FACILITY:  [facility]  PHYSICIAN:  Alexis Frock, MD     DATE OF BIRTH:  Jul 15, 1946  DATE OF PROCEDURE:  08/14/2015                              OPERATIVE REPORT  DIAGNOSES:  Bilateral ureteral stones, right renal colic.  PROCEDURE: 1. Cystoscopy with bilateral pyelogram interpretation. 2. Insertion of bilateral ureteral stents, 5 x 26 Polaris, no tether. 3. Right ureteroscopy with laser lithotripsy.  ESTIMATED BLOOD LOSS:  Nil.  COMPLICATIONS:  None.  SPECIMENS:  Right ureteral stone fragments for discard.  ASSISTANT:  Verdis Frederickson, MD.  FINDINGS: 1. Unremarkable urinary bladder. 2. Small left Hutch diverticulum. 3. Moderate-to-severe left hydronephrosis without ureteral nephrosis     with impacted proximal left ureteral stone. 4. Placement of left ureteral stent, proximal end renal pelvis and distal     in urinary bladder. 5. Right proximal partially obstructing ureteral stone with mild     hydronephrosis above this. 6. Complete resolution of all right ureteral stone fragments following     laser lithotripsy and basket extraction on the right. 7. Successful placement of right ureteral stent, proximal end renal     pelvis and distal in urinary bladder.  INDICATION:  [name] is a very pleasant 69 year old gentleman with history of recurrent nephrolithiasis.  He was found on workup with a colicky flank pain to have a right ureteral stone as well as somewhat unexpected very large left proximal ureteral stone, bilateral hydronephrosis.  He denied anuria or poor p.o. intake.  Given his impending bilateral obstruction, felt that urgent decompression was warranted.  Options were discussed with the patient including nephrostomy tubes versus stenting and he adamantly wished to proceed with the latter.  We also discussed making progress in terms  of his stone disease and he wished to proceed as well as long as anatomy favorable.  Informed consent was obtained and placed in the medical record.  PROCEDURE IN DETAIL:  The patient being [name], procedure being cysto bilateral stents and possible ureteroscopic stone manipulation was confirmed.  Procedure was carried out.  Time-out was performed. Intravenous antibiotics were administered.  General LMA anesthesia was induced.  The patient was into a low lithotomy position.  Sterile field was created by prepping and draping the patient's penis, perineum, and proximal thighs using iodine x3.  Next, cystourethroscopy was performed. A 23-French rigid cystoscope with 30-degree offset lens.  Inspection of anterior and posterior urethra were unremarkable.  Inspection of bladder revealed some mild trabeculation.  Ureteral orifices were in their normal anatomic position.  There was a small left Hutch diverticulum on the left.  Attention was initially directed to the left side and the left ureteral orifice was cannulated with 6-French end-hole catheter and left retrograde pyelogram was obtained.  Left retrograde pyelogram demonstrated single left ureter with single system left kidney.  There was moderate to severe hydronephrosis without ureteral nephrosis.  No filling defect in proximal ureter consistent with a known stone.  The open-ended catheter was advanced just below the stone and multiple angulations were performed to the angled tip Glidewire.  However, the stone  was quite impacted and did not allow wire access above this.  A single attempt was made over the Sensor wire. This location also not allowed through and through access past the stone.  The 6-French open-ended catheter was exchanged for the angled tip Imager type catheter and using sterile angulations.  The angled tip Glidewire was successfully navigated past the level of the stone as confirmed by additional retrograde  pyelography.  Next, a new 5 x 26 Polaris x2 was placed over the Glidewire using cystoscopic and fluoroscopic guidance.  Good proximal and distal deployment were noted. Efflux of urine was seen around into the distal end of the stent. Attention was directed to the right side.  The right ureteral orifice was cannulated with 6-French end-hole catheter and right retrograde pyelogram was obtained.  Right retrograde pyelogram demonstrates a single right ureter as well as single system right kidney.  There was mild hydronephrosis to filling defect in proximal ureter consistent with a known stone.  The angled tip ZIPwire was advanced at the level of upper pole and set aside as a safety wire.  Next, semi-rigid ureteroscopy was performed from the distal fourth of the right ureter alongside a separate Sensor working wire.  As expected, a single somewhat fusiform stone was seen in the proximal ureter.  This appeared to be much too large for simple basketing.  As such, holmium laser energy applied to the stone using settings of 0.6 joules and 6 hertz fragmenting the stone approximately 3 smaller pieces.  These were then sequentially grasped with a long axis and navigated out within the urinary bladder.  Repeat semi-rigid ureteroscopy to the entire length of the right ureter revealed complete resolution of all stone fragments larger than one-third millimeter. Excellent hemostasis with no evidence of perforation.  Given the patient's bilateral obstruction, it was felt that interval stenting was warranted on the right as well.  As such, a new 5 x 26 Polaris type stent was placed over the remaining ZIPwire on the right side using cystoscopic and fluoroscopic guidance.  Good proximal and distal deployment were noted.  Efflux of urine was seen around into the distal end of the stent.  Bladder was emptied per cystoscope.  Several stone fragments irrigated and set aside for discard given that the  patient already has an extensive stone samples previously and the procedure was terminated.  The patient tolerated the procedure well and no immediate periprocedural complications.  The patient was taken to the postanesthesia care unit in stable condition.  He will be admitted overnight for observation and repeat labs in the morning to rule out any significant metabolic derangement.          ______________________________ Alexis Frock, MD     TM/MEDQ  D:  08/14/2015  T:  08/15/2015  Job:  [id number]

## 2015-08-16 ENCOUNTER — Other Ambulatory Visit: Payer: Self-pay | Admitting: Urology

## 2015-08-28 ENCOUNTER — Encounter (HOSPITAL_COMMUNITY): Payer: Self-pay | Admitting: Pharmacist

## 2015-08-30 NOTE — Patient Instructions (Addendum)
YOUR PROCEDURE IS SCHEDULED ON :  09/04/15  REPORT TO [facility] MAIN ENTRANCE FOLLOW SIGNS TO RADIOLOGY 7:00 AM    CALL THIS NUMBER IF YOU HAVE PROBLEMS THE MORNING OF SURGERY [phone]  REMEMBER:ONLY 1 PER PERSON MAY GO TO SHORT STAY WITH YOU TO GET READY THE MORNING OF YOUR SURGERY  DO NOT EAT FOOD OR DRINK LIQUIDS AFTER MIDNIGHT  TAKE THESE MEDICINES THE MORNING OF SURGERY: PAXIL / FLONASE / FLOVENT / MAY TAKE HYDROMORPHONE OR OXYBUTYNIN IF NEEDED  YOU MAY NOT HAVE ANY METAL ON YOUR BODY INCLUDING HAIR PINS AND PIERCING'S. DO NOT WEAR JEWELRY, MAKEUP, LOTIONS, POWDERS OR PERFUMES. DO NOT WEAR NAIL POLISH. DO NOT SHAVE 48 HRS PRIOR TO SURGERY. MEN MAY SHAVE FACE AND NECK.  DO NOT [place]. [facility] IS NOT RESPONSIBLE FOR VALUABLES.  CONTACTS, DENTURES OR PARTIALS MAY NOT BE WORN TO SURGERY. LEAVE SUITCASE IN CAR. CAN BE BROUGHT TO ROOM AFTER SURGERY.  PATIENTS DISCHARGED THE DAY OF SURGERY WILL NOT BE ALLOWED TO DRIVE HOME.  PLEASE READ OVER THE FOLLOWING INSTRUCTION SHEETS _________________________________________________________________________________                                          [facility] - PREPARING FOR SURGERY  Before surgery, you can play an important role.  Because skin is not sterile, your skin needs to be as free of germs as possible.  You can reduce the number of germs on your skin by washing with CHG (chlorahexidine gluconate) soap before surgery.  CHG is an antiseptic cleaner which kills germs and bonds with the skin to continue killing germs even after washing. Please DO NOT use if you have an allergy to CHG or antibacterial soaps.  If your skin becomes reddened/irritated stop using the CHG and inform your nurse when you arrive at Short Stay. Do not shave (including legs and underarms) for at least 48 hours prior to the first CHG shower.  You may shave your face. Please follow these instructions  carefully:   1.  Shower with CHG Soap the night before surgery and the  morning of Surgery.   2.  If you choose to wash your hair, wash your hair first as usual with your  normal  Shampoo.   3.  After you shampoo, rinse your hair and body thoroughly to remove the  shampoo.                                         4.  Use CHG as you would any other liquid soap.  You can apply chg directly  to the skin and wash . Gently wash with scrungie or clean wascloth    5.  Apply the CHG Soap to your body ONLY FROM THE NECK DOWN.   Do not use on open                           Wound or open sores. Avoid contact with eyes, ears mouth and genitals (private parts).                        Genitals (private parts) with your normal soap.  6.  Wash thoroughly, paying special attention to the area where your surgery  will be performed.   7.  Thoroughly rinse your body with warm water from the neck down.   8.  DO NOT shower/wash with your normal soap after using and rinsing off  the CHG Soap .                9.  Pat yourself dry with a clean towel.             10.  Wear clean night clothes to bed after shower             11.  Place clean sheets on your bed the night of your first shower and do not  sleep with pets.  Day of Surgery : Do not apply any lotions/deodorants the morning of surgery.  Please wear clean clothes to the hospital/surgery center.  FAILURE TO FOLLOW THESE INSTRUCTIONS MAY RESULT IN THE CANCELLATION OF YOUR SURGERY    PATIENT SIGNATURE_________________________________  ______________________________________________________________________    WHAT IS A BLOOD TRANSFUSION? Blood Transfusion Information  A transfusion is the replacement of blood or some of its parts. Blood is made up of multiple cells which provide different functions.  Red blood cells carry oxygen and are used for blood loss replacement.  White blood cells fight against infection.  Platelets control  bleeding.  Plasma helps clot blood.  Other blood products are available for specialized needs, such as hemophilia or other clotting disorders. BEFORE THE TRANSFUSION  Who gives blood for transfusions?   Healthy volunteers who are fully evaluated to make sure their blood is safe. This is blood bank blood. Transfusion therapy is the safest it has ever been in the practice of medicine. Before blood is taken from a donor, a complete history is taken to make sure that person has no history of diseases nor engages in risky social behavior (examples are intravenous drug use or sexual activity with multiple partners). The donor's travel history is screened to minimize risk of transmitting infections, such as malaria. The donated blood is tested for signs of infectious diseases, such as HIV and hepatitis. The blood is then tested to be sure it is compatible with you in order to minimize the chance of a transfusion reaction. If you or a relative donates blood, this is often done in anticipation of surgery and is not appropriate for emergency situations. It takes many days to process the donated blood. RISKS AND COMPLICATIONS Although transfusion therapy is very safe and saves many lives, the main dangers of transfusion include:   Getting an infectious disease.  Developing a transfusion reaction. This is an allergic reaction to something in the blood you were given. Every precaution is taken to prevent this. The decision to have a blood transfusion has been considered carefully by your caregiver before blood is given. Blood is not given unless the benefits outweigh the risks. AFTER THE TRANSFUSION  Right after receiving a blood transfusion, you will usually feel much better and more energetic. This is especially true if your red blood cells have gotten low (anemic). The transfusion raises the level of the red blood cells which carry oxygen, and this usually causes an energy increase.  The nurse  administering the transfusion will monitor you carefully for complications. HOME CARE INSTRUCTIONS  No special instructions are needed after a transfusion. You may find your energy is better. Speak with your caregiver about any limitations on activity for underlying diseases you may have.  SEEK MEDICAL CARE IF:   Your condition is not improving after your transfusion.  You develop redness or irritation at the intravenous (IV) site. SEEK IMMEDIATE MEDICAL CARE IF:  Any of the following symptoms occur over the next 12 hours:  Shaking chills.  You have a temperature by mouth above 102 F (38.9 C), not controlled by medicine.  Chest, back, or muscle pain.  People around you feel you are not acting correctly or are confused.  Shortness of breath or difficulty breathing.  Dizziness and fainting.  You get a rash or develop hives.  You have a decrease in urine output.  Your urine turns a dark color or changes to pink, red, or brown. Any of the following symptoms occur over the next 10 days:  You have a temperature by mouth above 102 F (38.9 C), not controlled by medicine.  Shortness of breath.  Weakness after normal activity.  The white part of the eye turns yellow (jaundice).  You have a decrease in the amount of urine or are urinating less often.  Your urine turns a dark color or changes to pink, red, or brown. Document Released: 12/05/2000 Document Revised: 03/01/2012 Document Reviewed: 07/24/2008 [facility] Patient Information 2014 [place], [place].  _______________________________________________________________________

## 2015-08-31 ENCOUNTER — Encounter (HOSPITAL_COMMUNITY): Payer: Self-pay

## 2015-08-31 ENCOUNTER — Encounter (HOSPITAL_COMMUNITY)
Admission: RE | Admit: 2015-08-31 | Discharge: 2015-08-31 | Disposition: A | Discharge status: Home / Self Care | Payer: Medicare Other | Source: Ambulatory Visit | Attending: Urology | Admitting: Urology

## 2015-08-31 DIAGNOSIS — Z01818 Encounter for other preprocedural examination: Secondary | ICD-10-CM | POA: Diagnosis present

## 2015-08-31 DIAGNOSIS — N200 Calculus of kidney: Secondary | ICD-10-CM | POA: Diagnosis not present

## 2015-08-31 LAB — BASIC METABOLIC PANEL
Anion gap: 7 (ref 5–15)
BUN: 13 mg/dL (ref 6–20)
CHLORIDE: 105 mmol/L (ref 101–111)
CO2: 28 mmol/L (ref 22–32)
Calcium: 9.2 mg/dL (ref 8.9–10.3)
Creatinine, Ser: 1.2 mg/dL (ref 0.61–1.24)
GFR calc Af Amer: >60 mL/min (ref >60)
GFR calc non Af Amer: >60 mL/min (ref >60)
GLUCOSE: 108 mg/dL — AB (ref 65–99)
POTASSIUM: 4.1 mmol/L (ref 3.5–5.1)
Sodium: 140 mmol/L (ref 135–145)

## 2015-08-31 LAB — CBC
HCT: 38.3 % — ABNORMAL LOW (ref 39.0–52.0)
HEMOGLOBIN: 13.1 g/dL (ref 13.0–17.0)
MCH: 33.1 pg (ref 26.0–34.0)
MCHC: 34.2 g/dL (ref 30.0–36.0)
MCV: 96.7 fL (ref 78.0–100.0)
Platelets: 251 10*3/uL (ref 150–400)
RBC: 3.96 MIL/uL — AB (ref 4.22–5.81)
RDW: 12 % (ref 11.5–15.5)
WBC: 5.3 10*3/uL (ref 4.0–10.5)

## 2015-09-03 ENCOUNTER — Other Ambulatory Visit: Payer: Self-pay | Admitting: Radiology

## 2015-09-04 ENCOUNTER — Ambulatory Visit (HOSPITAL_COMMUNITY): Payer: Medicare Other

## 2015-09-04 ENCOUNTER — Encounter (HOSPITAL_COMMUNITY): Payer: Self-pay | Admitting: *Deleted

## 2015-09-04 ENCOUNTER — Ambulatory Visit (HOSPITAL_COMMUNITY): Payer: Medicare Other | Admitting: Certified Registered Nurse Anesthetist

## 2015-09-04 ENCOUNTER — Observation Stay (HOSPITAL_COMMUNITY)
Admission: RE | Admit: 2015-09-04 | Discharge: 2015-09-07 | Disposition: A | Discharge status: Home / Self Care | Payer: Medicare Other | Source: Ambulatory Visit | Attending: Urology | Admitting: Urology

## 2015-09-04 ENCOUNTER — Ambulatory Visit (HOSPITAL_COMMUNITY)
Admission: RE | Admit: 2015-09-04 | Discharge: 2015-09-04 | Disposition: A | Discharge status: Home / Self Care | Payer: Medicare Other | Source: Ambulatory Visit | Attending: Urology | Admitting: Urology

## 2015-09-04 ENCOUNTER — Encounter (HOSPITAL_COMMUNITY)
Admission: RE | Disposition: A | Discharge status: Home / Self Care | Payer: Self-pay | Source: Ambulatory Visit | Attending: Urology

## 2015-09-04 DIAGNOSIS — N134 Hydroureter: Secondary | ICD-10-CM | POA: Insufficient documentation

## 2015-09-04 DIAGNOSIS — Z79899 Other long term (current) drug therapy: Secondary | ICD-10-CM | POA: Insufficient documentation

## 2015-09-04 DIAGNOSIS — E780 Pure hypercholesterolemia: Secondary | ICD-10-CM | POA: Diagnosis not present

## 2015-09-04 DIAGNOSIS — J45909 Unspecified asthma, uncomplicated: Secondary | ICD-10-CM | POA: Diagnosis not present

## 2015-09-04 DIAGNOSIS — N132 Hydronephrosis with renal and ureteral calculous obstruction: Principal | ICD-10-CM | POA: Insufficient documentation

## 2015-09-04 DIAGNOSIS — F410 Panic disorder [episodic paroxysmal anxiety] without agoraphobia: Secondary | ICD-10-CM | POA: Insufficient documentation

## 2015-09-04 DIAGNOSIS — M549 Dorsalgia, unspecified: Secondary | ICD-10-CM | POA: Insufficient documentation

## 2015-09-04 DIAGNOSIS — Z87442 Personal history of urinary calculi: Secondary | ICD-10-CM | POA: Diagnosis not present

## 2015-09-04 DIAGNOSIS — Z7951 Long term (current) use of inhaled steroids: Secondary | ICD-10-CM | POA: Insufficient documentation

## 2015-09-04 DIAGNOSIS — N289 Disorder of kidney and ureter, unspecified: Secondary | ICD-10-CM | POA: Insufficient documentation

## 2015-09-04 DIAGNOSIS — N135 Crossing vessel and stricture of ureter without hydronephrosis: Secondary | ICD-10-CM

## 2015-09-04 DIAGNOSIS — G8929 Other chronic pain: Secondary | ICD-10-CM | POA: Diagnosis not present

## 2015-09-04 DIAGNOSIS — Z79891 Long term (current) use of opiate analgesic: Secondary | ICD-10-CM | POA: Insufficient documentation

## 2015-09-04 DIAGNOSIS — M5136 Other intervertebral disc degeneration, lumbar region: Secondary | ICD-10-CM | POA: Diagnosis not present

## 2015-09-04 DIAGNOSIS — N201 Calculus of ureter: Secondary | ICD-10-CM | POA: Diagnosis present

## 2015-09-04 DIAGNOSIS — Z96642 Presence of left artificial hip joint: Secondary | ICD-10-CM | POA: Insufficient documentation

## 2015-09-04 DIAGNOSIS — N200 Calculus of kidney: Secondary | ICD-10-CM

## 2015-09-04 DIAGNOSIS — F419 Anxiety disorder, unspecified: Secondary | ICD-10-CM | POA: Diagnosis not present

## 2015-09-04 HISTORY — PX: CYSTOSCOPY W/ URETERAL STENT REMOVAL: SHX1430

## 2015-09-04 HISTORY — PX: NEPHROLITHOTOMY: SHX5134

## 2015-09-04 HISTORY — PX: HOLMIUM LASER APPLICATION: SHX5852

## 2015-09-04 LAB — BASIC METABOLIC PANEL
ANION GAP: 4 — AB (ref 5–15)
BUN: 12 mg/dL (ref 6–20)
CALCIUM: 8.5 mg/dL — AB (ref 8.9–10.3)
CO2: 30 mmol/L (ref 22–32)
Chloride: 105 mmol/L (ref 101–111)
Creatinine, Ser: 1.29 mg/dL — ABNORMAL HIGH (ref 0.61–1.24)
GFR calc Af Amer: >60 mL/min (ref >60)
GFR, EST NON AFRICAN AMERICAN: 56 mL/min — AB (ref >60)
Glucose, Bld: 143 mg/dL — ABNORMAL HIGH (ref 65–99)
POTASSIUM: 3.9 mmol/L (ref 3.5–5.1)
SODIUM: 139 mmol/L (ref 135–145)

## 2015-09-04 LAB — CBC WITH DIFFERENTIAL/PLATELET
Basophils Absolute: 0 10*3/uL (ref 0.0–0.1)
Basophils Relative: 0 % (ref 0–1)
EOS ABS: 0.2 10*3/uL (ref 0.0–0.7)
EOS PCT: 3 % (ref 0–5)
HCT: 36.4 % — ABNORMAL LOW (ref 39.0–52.0)
HEMOGLOBIN: 12.6 g/dL — AB (ref 13.0–17.0)
LYMPHS PCT: 12 % (ref 12–46)
Lymphs Abs: 0.7 10*3/uL (ref 0.7–4.0)
MCH: 33.2 pg (ref 26.0–34.0)
MCHC: 34.6 g/dL (ref 30.0–36.0)
MCV: 96 fL (ref 78.0–100.0)
MONO ABS: 0.3 10*3/uL (ref 0.1–1.0)
Monocytes Relative: 6 % (ref 3–12)
NEUTROS PCT: 79 % — AB (ref 43–77)
Neutro Abs: 4.4 10*3/uL (ref 1.7–7.7)
PLATELETS: 199 10*3/uL (ref 150–400)
RBC: 3.79 MIL/uL — AB (ref 4.22–5.81)
RDW: 12.1 % (ref 11.5–15.5)
WBC: 5.6 10*3/uL (ref 4.0–10.5)

## 2015-09-04 LAB — TYPE AND SCREEN
ABO/RH(D): A POS
Antibody Screen: NEGATIVE

## 2015-09-04 LAB — PROTIME-INR
INR: 1.13 (ref 0.00–1.49)
PROTHROMBIN TIME: 14.7 s (ref 11.6–15.2)

## 2015-09-04 LAB — HEMOGLOBIN AND HEMATOCRIT, BLOOD
HCT: 35.2 % — ABNORMAL LOW (ref 39.0–52.0)
HEMOGLOBIN: 12.2 g/dL — AB (ref 13.0–17.0)

## 2015-09-04 LAB — APTT: aPTT: 23 seconds — ABNORMAL LOW (ref 24–37)

## 2015-09-04 SURGERY — NEPHROLITHOTOMY PERCUTANEOUS
Anesthesia: General | Laterality: Left

## 2015-09-04 MED ORDER — HYDROMORPHONE HCL 2 MG PO TABS
2.0000 mg | ORAL_TABLET | ORAL | Status: DC | PRN
  Administered 2015-09-04 – 2015-09-07 (×5): 2 mg via ORAL
  Filled 2015-09-04 (×6): qty 1

## 2015-09-04 MED ORDER — FENTANYL CITRATE (PF) 100 MCG/2ML IJ SOLN
INTRAMUSCULAR | Status: AC
  Filled 2015-09-04: qty 4

## 2015-09-04 MED ORDER — SODIUM CHLORIDE 0.9 % IJ SOLN
INTRAMUSCULAR | Status: AC
  Filled 2015-09-04: qty 10

## 2015-09-04 MED ORDER — MIDAZOLAM HCL 2 MG/2ML IJ SOLN
INTRAMUSCULAR | Status: AC
  Filled 2015-09-04: qty 6

## 2015-09-04 MED ORDER — EPHEDRINE SULFATE 50 MG/ML IJ SOLN
INTRAMUSCULAR | Status: AC
  Filled 2015-09-04: qty 1

## 2015-09-04 MED ORDER — SODIUM CHLORIDE 0.9 % IR SOLN
Status: DC | PRN
  Administered 2015-09-04: 21000 mL

## 2015-09-04 MED ORDER — MIDAZOLAM HCL 2 MG/2ML IJ SOLN
INTRAMUSCULAR | Status: AC
  Filled 2015-09-04: qty 4

## 2015-09-04 MED ORDER — LACTATED RINGERS IV SOLN
INTRAVENOUS | Status: DC | PRN
  Administered 2015-09-04 (×3): via INTRAVENOUS

## 2015-09-04 MED ORDER — FENTANYL CITRATE (PF) 100 MCG/2ML IJ SOLN
INTRAMUSCULAR | Status: AC | PRN
  Administered 2015-09-04 (×2): 25 ug via INTRAVENOUS
  Administered 2015-09-04: 50 ug via INTRAVENOUS

## 2015-09-04 MED ORDER — HYDROMORPHONE HCL 1 MG/ML IJ SOLN
0.5000 mg | INTRAMUSCULAR | Status: DC | PRN
  Administered 2015-09-04: 0.5 mg via INTRAVENOUS
  Administered 2015-09-05 – 2015-09-06 (×6): 1 mg via INTRAVENOUS
  Filled 2015-09-04 (×8): qty 1

## 2015-09-04 MED ORDER — LORATADINE 10 MG PO TABS: 10.0000 mg | ORAL_TABLET | Freq: Every day | ORAL | Status: DC

## 2015-09-04 MED ORDER — DIPHENHYDRAMINE HCL 12.5 MG/5ML PO ELIX: 12.5000 mg | ORAL_SOLUTION | Freq: Four times a day (QID) | ORAL | Status: DC | PRN

## 2015-09-04 MED ORDER — PROPOFOL 10 MG/ML IV BOLUS
INTRAVENOUS | Status: DC | PRN
  Administered 2015-09-04: 150 mg via INTRAVENOUS

## 2015-09-04 MED ORDER — PAROXETINE HCL 20 MG PO TABS
40.0000 mg | ORAL_TABLET | Freq: Every day | ORAL | Status: DC
  Administered 2015-09-05 – 2015-09-07 (×3): 40 mg via ORAL
  Filled 2015-09-04 (×3): qty 2

## 2015-09-04 MED ORDER — PHENAZOPYRIDINE HCL 100 MG PO TABS
100.0000 mg | ORAL_TABLET | Freq: Three times a day (TID) | ORAL | Status: DC | PRN
  Filled 2015-09-04: qty 1

## 2015-09-04 MED ORDER — MIDAZOLAM HCL 5 MG/5ML IJ SOLN
INTRAMUSCULAR | Status: DC | PRN
  Administered 2015-09-04 (×2): 0.5 mg via INTRAVENOUS
  Administered 2015-09-04: 1 mg via INTRAVENOUS

## 2015-09-04 MED ORDER — DOCUSATE SODIUM 100 MG PO CAPS
100.0000 mg | ORAL_CAPSULE | Freq: Two times a day (BID) | ORAL | Status: DC
  Administered 2015-09-05 – 2015-09-07 (×6): 100 mg via ORAL
  Filled 2015-09-04 (×7): qty 1

## 2015-09-04 MED ORDER — LACTATED RINGERS IV SOLN
INTRAVENOUS | Status: DC
  Administered 2015-09-04: 1000 mL via INTRAVENOUS

## 2015-09-04 MED ORDER — IOHEXOL 300 MG/ML  SOLN
INTRAMUSCULAR | Status: DC | PRN
  Administered 2015-09-04: 50 mL

## 2015-09-04 MED ORDER — ACETAMINOPHEN 10 MG/ML IV SOLN
INTRAVENOUS | Status: AC
Start: 2015-09-04 — End: 2015-09-05
  Filled 2015-09-04: qty 100

## 2015-09-04 MED ORDER — MIDAZOLAM HCL 2 MG/2ML IJ SOLN
INTRAMUSCULAR | Status: AC | PRN
  Administered 2015-09-04 (×5): 1 mg via INTRAVENOUS

## 2015-09-04 MED ORDER — ACETAMINOPHEN 325 MG PO TABS
650.0000 mg | ORAL_TABLET | ORAL | Status: DC | PRN
  Administered 2015-09-04: 650 mg via ORAL
  Filled 2015-09-04: qty 2

## 2015-09-04 MED ORDER — MAGNESIUM HYDROXIDE 400 MG/5ML PO SUSP: 30.0000 mL | Freq: Every day | ORAL | Status: DC | PRN

## 2015-09-04 MED ORDER — DEXAMETHASONE SODIUM PHOSPHATE 10 MG/ML IJ SOLN
INTRAMUSCULAR | Status: AC
Start: 2015-09-04 — End: 2015-09-04
  Filled 2015-09-04: qty 1

## 2015-09-04 MED ORDER — HYDROMORPHONE HCL 1 MG/ML IJ SOLN: 0.2500 mg | INTRAMUSCULAR | Status: DC | PRN

## 2015-09-04 MED ORDER — OXYBUTYNIN CHLORIDE 5 MG PO TABS: 5.0000 mg | ORAL_TABLET | Freq: Three times a day (TID) | ORAL | Status: DC | PRN

## 2015-09-04 MED ORDER — SODIUM CHLORIDE 0.9 % IV SOLN
8.0000 mg | Freq: Once | INTRAVENOUS | Status: DC
  Filled 2015-09-04: qty 4

## 2015-09-04 MED ORDER — ROCURONIUM BROMIDE 100 MG/10ML IV SOLN
INTRAVENOUS | Status: AC
  Filled 2015-09-04: qty 1

## 2015-09-04 MED ORDER — CIPROFLOXACIN HCL 500 MG PO TABS
500.0000 mg | ORAL_TABLET | Freq: Two times a day (BID) | ORAL | Status: AC
  Administered 2015-09-04 – 2015-09-05 (×2): 500 mg via ORAL
  Filled 2015-09-04 (×2): qty 1

## 2015-09-04 MED ORDER — LEVOCETIRIZINE DIHYDROCHLORIDE 5 MG PO TABS: 5.0000 mg | ORAL_TABLET | Freq: Every day | ORAL | Status: DC

## 2015-09-04 MED ORDER — CIPROFLOXACIN IN D5W 400 MG/200ML IV SOLN
400.0000 mg | INTRAVENOUS | Status: AC
  Administered 2015-09-04 (×2): 400 mg via INTRAVENOUS

## 2015-09-04 MED ORDER — EPHEDRINE SULFATE 50 MG/ML IJ SOLN
INTRAMUSCULAR | Status: DC | PRN
  Administered 2015-09-04 (×5): 5 mg via INTRAVENOUS

## 2015-09-04 MED ORDER — FENTANYL CITRATE (PF) 100 MCG/2ML IJ SOLN
INTRAMUSCULAR | Status: DC | PRN
  Administered 2015-09-04: 50 ug via INTRAVENOUS
  Administered 2015-09-04: 25 ug via INTRAVENOUS
  Administered 2015-09-04 (×3): 50 ug via INTRAVENOUS
  Administered 2015-09-04: 25 ug via INTRAVENOUS

## 2015-09-04 MED ORDER — FLEET ENEMA 7-19 GM/118ML RE ENEM: 1.0000 | ENEMA | Freq: Once | RECTAL | Status: DC | PRN

## 2015-09-04 MED ORDER — PROPOFOL 10 MG/ML IV BOLUS
INTRAVENOUS | Status: AC
  Filled 2015-09-04: qty 20

## 2015-09-04 MED ORDER — LIDOCAINE HCL (CARDIAC) 20 MG/ML IV SOLN
INTRAVENOUS | Status: AC
  Filled 2015-09-04: qty 5

## 2015-09-04 MED ORDER — LIDOCAINE HCL 1 % IJ SOLN
INTRAMUSCULAR | Status: AC
  Filled 2015-09-04: qty 20

## 2015-09-04 MED ORDER — ALPRAZOLAM 0.25 MG PO TABS
0.2500 mg | ORAL_TABLET | Freq: Three times a day (TID) | ORAL | Status: DC | PRN
Start: 2015-09-04 — End: 2015-09-07

## 2015-09-04 MED ORDER — ACETAMINOPHEN 10 MG/ML IV SOLN
1000.0000 mg | Freq: Four times a day (QID) | INTRAVENOUS | Status: AC
  Administered 2015-09-04 – 2015-09-05 (×4): 1000 mg via INTRAVENOUS
  Filled 2015-09-04 (×5): qty 100

## 2015-09-04 MED ORDER — LACTATED RINGERS IV SOLN: INTRAVENOUS | Status: DC

## 2015-09-04 MED ORDER — SCOPOLAMINE 1 MG/3DAYS TD PT72
MEDICATED_PATCH | TRANSDERMAL | Status: AC
  Filled 2015-09-04: qty 1

## 2015-09-04 MED ORDER — CIPROFLOXACIN IN D5W 400 MG/200ML IV SOLN: 400.0000 mg | INTRAVENOUS | Status: DC

## 2015-09-04 MED ORDER — ONDANSETRON HCL 4 MG/2ML IJ SOLN
INTRAMUSCULAR | Status: AC
  Filled 2015-09-04: qty 2

## 2015-09-04 MED ORDER — SCOPOLAMINE 1 MG/3DAYS TD PT72
MEDICATED_PATCH | TRANSDERMAL | Status: DC | PRN
  Administered 2015-09-04: 1 via TRANSDERMAL

## 2015-09-04 MED ORDER — CIPROFLOXACIN IN D5W 400 MG/200ML IV SOLN
INTRAVENOUS | Status: AC
  Filled 2015-09-04: qty 200

## 2015-09-04 MED ORDER — FENTANYL CITRATE (PF) 250 MCG/5ML IJ SOLN
INTRAMUSCULAR | Status: AC
  Filled 2015-09-04: qty 25

## 2015-09-04 MED ORDER — DEXAMETHASONE SODIUM PHOSPHATE 10 MG/ML IJ SOLN
INTRAMUSCULAR | Status: DC | PRN
  Administered 2015-09-04: 10 mg via INTRAVENOUS

## 2015-09-04 MED ORDER — FLUTICASONE PROPIONATE 50 MCG/ACT NA SUSP
2.0000 | Freq: Every day | NASAL | Status: DC
  Administered 2015-09-05 – 2015-09-06 (×2): 2 via NASAL
  Filled 2015-09-04: qty 16

## 2015-09-04 MED ORDER — ONDANSETRON HCL 4 MG/2ML IJ SOLN
INTRAMUSCULAR | Status: DC | PRN
  Administered 2015-09-04: 4 mg via INTRAVENOUS

## 2015-09-04 MED ORDER — ROCURONIUM BROMIDE 100 MG/10ML IV SOLN
INTRAVENOUS | Status: DC | PRN
Start: 2015-09-04 — End: 2015-09-04
  Administered 2015-09-04: 50 mg via INTRAVENOUS

## 2015-09-04 MED ORDER — KCL IN DEXTROSE-NACL 20-5-0.45 MEQ/L-%-% IV SOLN
INTRAVENOUS | Status: DC
  Administered 2015-09-04 – 2015-09-05 (×2): via INTRAVENOUS
  Filled 2015-09-04 (×8): qty 1000

## 2015-09-04 MED ORDER — DIPHENHYDRAMINE HCL 50 MG/ML IJ SOLN: 12.5000 mg | Freq: Four times a day (QID) | INTRAMUSCULAR | Status: DC | PRN

## 2015-09-04 MED ORDER — LIDOCAINE HCL (CARDIAC) 20 MG/ML IV SOLN
INTRAVENOUS | Status: DC | PRN
  Administered 2015-09-04: 75 mg via INTRAVENOUS

## 2015-09-04 MED ORDER — SODIUM CHLORIDE 0.9 % IV SOLN
INTRAVENOUS | Status: DC
  Administered 2015-09-04 (×2): via INTRAVENOUS

## 2015-09-04 MED ORDER — ZOLPIDEM TARTRATE 5 MG PO TABS: 5.0000 mg | ORAL_TABLET | Freq: Every evening | ORAL | Status: DC | PRN

## 2015-09-04 MED ORDER — ONDANSETRON HCL 4 MG/2ML IJ SOLN: 4.0000 mg | INTRAMUSCULAR | Status: DC | PRN

## 2015-09-04 MED ORDER — BISACODYL 10 MG RE SUPP: 10.0000 mg | Freq: Every day | RECTAL | Status: DC | PRN

## 2015-09-04 MED ORDER — BUDESONIDE 0.25 MG/2ML IN SUSP
0.2500 mg | Freq: Two times a day (BID) | RESPIRATORY_TRACT | Status: DC
  Administered 2015-09-04 – 2015-09-06 (×4): 0.25 mg via RESPIRATORY_TRACT
  Filled 2015-09-04 (×6): qty 2

## 2015-09-04 MED ORDER — ALBUTEROL SULFATE (2.5 MG/3ML) 0.083% IN NEBU: 3.0000 mL | INHALATION_SOLUTION | Freq: Four times a day (QID) | RESPIRATORY_TRACT | Status: DC | PRN

## 2015-09-04 SURGICAL SUPPLY — 49 items
BAG URINE DRAINAGE (UROLOGICAL SUPPLIES) ×4 IMPLANT
BASKET ZERO TIP NITINOL 2.4FR (BASKET) ×4 IMPLANT
BENZOIN TINCTURE PRP APPL 2/3 (GAUZE/BANDAGES/DRESSINGS) ×16 IMPLANT
BLADE SURG 15 STRL LF DISP TIS (BLADE) ×2 IMPLANT
BLADE SURG 15 STRL SS (BLADE) ×2
CARTRIDGE STONEBREAK CO2 KIDNE (ELECTROSURGICAL) IMPLANT
CATH AINSWORTH 30CC 24FR (CATHETERS) ×4 IMPLANT
CATH ROBINSON RED A/P 20FR (CATHETERS) IMPLANT
CATH URET 5FR 28IN OPEN ENDED (CATHETERS) IMPLANT
CATH URET DUAL LUMEN 6-10FR 50 (CATHETERS) ×4 IMPLANT
CATH X-FORCE N30 NEPHROSTOMY (TUBING) ×4 IMPLANT
COVER SURGICAL LIGHT HANDLE (MISCELLANEOUS) ×4 IMPLANT
DRAPE C-ARM 42X120 X-RAY (DRAPES) ×4 IMPLANT
DRAPE CAMERA CLOSED 9X96 (DRAPES) IMPLANT
DRAPE LINGEMAN PERC (DRAPES) ×4 IMPLANT
DRAPE SURG IRRIG POUCH 19X23 (DRAPES) ×12 IMPLANT
DRSG PAD ABDOMINAL 8X10 ST (GAUZE/BANDAGES/DRESSINGS) ×4 IMPLANT
DRSG TEGADERM 8X12 (GAUZE/BANDAGES/DRESSINGS) ×4 IMPLANT
EXTRACTOR STONE NITINOL NGAGE (UROLOGICAL SUPPLIES) ×4 IMPLANT
FIBER LASER FLEXIVA 1000 (UROLOGICAL SUPPLIES) IMPLANT
FIBER LASER FLEXIVA 200 (UROLOGICAL SUPPLIES) IMPLANT
FIBER LASER FLEXIVA 365 (UROLOGICAL SUPPLIES) ×4 IMPLANT
FIBER LASER FLEXIVA 550 (UROLOGICAL SUPPLIES) IMPLANT
FIBER LASER TRAC TIP (UROLOGICAL SUPPLIES) ×4 IMPLANT
GAUZE SPONGE 4X4 12PLY STRL (GAUZE/BANDAGES/DRESSINGS) ×4 IMPLANT
GLOVE SURG SS PI 8.0 STRL IVOR (GLOVE) IMPLANT
GOWN STRL REUS W/TWL XL LVL3 (GOWN DISPOSABLE) ×4 IMPLANT
GUIDEWIRE AMPLAZ .035X145 (WIRE) ×8 IMPLANT
KIT BASIN OR (CUSTOM PROCEDURE TRAY) ×4 IMPLANT
MANIFOLD NEPTUNE II (INSTRUMENTS) ×4 IMPLANT
MASK EYE SHIELD (GAUZE/BANDAGES/DRESSINGS) ×4 IMPLANT
NS IRRIG 1000ML POUR BTL (IV SOLUTION) ×4 IMPLANT
PACK BASIC VI WITH GOWN DISP (CUSTOM PROCEDURE TRAY) ×4 IMPLANT
PROBE KIDNEY STONEBRKR 2.0X425 (ELECTROSURGICAL) IMPLANT
PROBE LITHOCLAST ULTRA 3.8X403 (UROLOGICAL SUPPLIES) IMPLANT
PROBE PNEUMATIC 1.0MMX570MM (UROLOGICAL SUPPLIES) IMPLANT
SET IRRIG Y TYPE TUR BLADDER L (SET/KITS/TRAYS/PACK) ×8 IMPLANT
SPONGE LAP 4X18 X RAY DECT (DISPOSABLE) ×4 IMPLANT
STONE CATCHER W/TUBE ADAPTER (UROLOGICAL SUPPLIES) IMPLANT
SUT SILK 2 0 30  PSL (SUTURE) ×2
SUT SILK 2 0 30 PSL (SUTURE) ×2 IMPLANT
SYR 20CC LL (SYRINGE) ×8 IMPLANT
SYRINGE 10CC LL (SYRINGE) ×4 IMPLANT
TAPE CLOTH SURG 6X10 WHT LF (GAUZE/BANDAGES/DRESSINGS) ×4 IMPLANT
TOWEL OR 17X26 10 PK STRL BLUE (TOWEL DISPOSABLE) IMPLANT
TOWEL OR NON WOVEN STRL DISP B (DISPOSABLE) ×4 IMPLANT
TRAY FOLEY W/METER SILVER 16FR (SET/KITS/TRAYS/PACK) ×4 IMPLANT
TUBING CONNECTING 10 (TUBING) ×6 IMPLANT
TUBING CONNECTING 10' (TUBING) ×2

## 2015-09-04 NOTE — Anesthesia Postprocedure Evaluation (Signed)
  Anesthesia Post-op Note  Patient: [name].  Procedure(s) Performed: Procedure(s) (LRB): 1ST STAGE LEFT PERCUTANEOUS NEPHROLITHOTOMY  (Left) CYSTOSCOPY WITH BILATERAL STENT REMOVAL (Bilateral) HOLMIUM LASER APPLICATION (Left)  Patient Location: PACU  Anesthesia Type: General  Level of Consciousness: awake and alert   Airway and Oxygen Therapy: Patient Spontanous Breathing  Post-op Pain: mild  Post-op Assessment: Post-op Vital signs reviewed, Patient's Cardiovascular Status Stable, Respiratory Function Stable, Patent Airway and No signs of Nausea or vomiting  Last Vitals:  Filed Vitals:   09/04/15 1400  BP: 143/74  Pulse: 86  Temp:   Resp: 15    Post-op Vital Signs: stable   Complications: No apparent anesthesia complications

## 2015-09-04 NOTE — Brief Op Note (Signed)
09/04/2015  1:13 PM  PATIENT:  [name].  69 y.o. male  PRE-OPERATIVE DIAGNOSIS:  LEFT UPJ STONE <2cm   POST-OPERATIVE DIAGNOSIS:  LEFT UPJ STONE   PROCEDURE:  Procedure(s): 1ST STAGE LEFT PERCUTANEOUS NEPHROLITHOTOMY  (Left) CYSTOSCOPY WITH BILATERAL STENT REMOVAL (Bilateral) HOLMIUM LASER APPLICATION (Left)  SURGEON:  Surgeon(s) and Role:    * Irine Seal, MD - Primary  PHYSICIAN ASSISTANT:   ASSISTANTS: none   ANESTHESIA:   none  EBL:  Total I/O In: 1000 [I.V.:1000] Out: 275 [Urine:275]  BLOOD ADMINISTERED:none  DRAINS: Urinary Catheter (Foley) and 31fr [place] NT and 69fr NT.   LOCAL MEDICATIONS USED:  NONE  SPECIMEN:  Source of Specimen:  stone fragments  DISPOSITION OF SPECIMEN:  to patient to bring to office  COUNTS:  YES  TOURNIQUET:  * No tourniquets in log *  DICTATION: .Other Dictation: Dictation Number [id number]  PLAN OF CARE: Admit for overnight observation  PATIENT DISPOSITION:  PACU - hemodynamically stable.   Delay start of Pharmacological VTE agent (>24hrs) due to surgical blood loss or risk of bleeding: yes

## 2015-09-04 NOTE — Procedures (Signed)
Interventional Radiology Procedure Note  Procedure: Left percutaneous nephrostomy and placement of ureteral stent  Complications: None  Estimated Blood Loss: 0  Recommendations: - to OR for PCNL  Signed,  Criselda Peaches, MD

## 2015-09-04 NOTE — Anesthesia Preprocedure Evaluation (Signed)
Anesthesia Evaluation  Patient identified by MRN, date of birth, ID band Patient awake    Reviewed: Allergy & Precautions, H&P , NPO status , Patient's Chart, lab work & pertinent test results  History of Anesthesia Complications (+) PONV  Airway Mallampati: II  TM Distance: >3 FB Neck ROM: full    Dental  (+) Caps, Dental Advisory Given,    Pulmonary asthma ,  Mild asthma   Pulmonary exam normal breath sounds clear to auscultation       Cardiovascular Exercise Tolerance: Good negative cardio ROS Normal cardiovascular exam Rhythm:regular Rate:Normal     Neuro/Psych Anxiety Panic disorder 2002Cervical fusion negative neurological ROS  negative psych ROS   GI/Hepatic negative GI ROS, Neg liver ROS,   Endo/Other  negative endocrine ROS  Renal/GU negative Renal ROS  negative genitourinary   Musculoskeletal   Abdominal   Peds  Hematology negative hematology ROS (+)   Anesthesia Other Findings   Reproductive/Obstetrics negative OB ROS                             Anesthesia Physical Anesthesia Plan  ASA: II  Anesthesia Plan: General   Post-op Pain Management:    Induction: Intravenous  Airway Management Planned: Oral ETT  Additional Equipment:   Intra-op Plan:   Post-operative Plan: Extubation in OR  Informed Consent:   Plan Discussed with: Surgeon  Anesthesia Plan Comments:         Anesthesia Quick Evaluation

## 2015-09-04 NOTE — Transfer of Care (Signed)
Immediate Anesthesia Transfer of Care Note  Patient: [name].  Procedure(s) Performed: Procedure(s): 1ST STAGE LEFT PERCUTANEOUS NEPHROLITHOTOMY  (Left) CYSTOSCOPY WITH BILATERAL STENT REMOVAL (Bilateral) HOLMIUM LASER APPLICATION (Left)  Patient Location: PACU  Anesthesia Type:General  Level of Consciousness: awake, oriented, patient cooperative, lethargic and responds to stimulation  Airway & Oxygen Therapy: Patient Spontanous Breathing and Patient connected to face mask oxygen  Post-op Assessment: Report given to RN, Post -op Vital signs reviewed and stable and Patient moving all extremities  Post vital signs: Reviewed and stable  Last Vitals:  Filed Vitals:   09/04/15 1045  BP:   Pulse: 68  Temp:   Resp:     Complications: No apparent anesthesia complications

## 2015-09-04 NOTE — Anesthesia Procedure Notes (Signed)
Procedure Name: Intubation Date/Time: 09/04/2015 11:03 AM Performed by: Ofilia Neas Pre-anesthesia Checklist: Patient identified, Emergency Drugs available, Suction available, Timeout performed and Patient being monitored Patient Re-evaluated:Patient Re-evaluated prior to inductionOxygen Delivery Method: Circle system utilized Preoxygenation: Pre-oxygenation with 100% oxygen Intubation Type: IV induction and Cricoid Pressure applied Ventilation: Mask ventilation without difficulty Laryngoscope Size: Mac and 4 Grade View: Grade III Tube type: Oral Tube size: 7.5 mm Number of attempts: 1 Airway Equipment and Method: Stylet Placement Confirmation: positive ETCO2 and breath sounds checked- equal and bilateral (arytenoids visualized) Secured at: 21 cm Tube secured with: Tape Dental Injury: Teeth and Oropharynx as per pre-operative assessment  Future Recommendations: Recommend- induction with short-acting agent, and alternative techniques readily available Comments: Poor neck mobility due to prior discectomy, fusion.

## 2015-09-04 NOTE — H&P (Signed)
Chief Complaint: Patient was seen in consultation today for left percutaneous nephrostomy/nephroureteral catheter placement     Referring Physician(s): Wrenn,[name]  History of Present Illness: [name]. is a 69 y.o. male with history of recurrent nephrolithiasis who was recently admitted to the hospital on 8/23 with diagnoses right ureteral stone and left ureteral stone with bilateral hydroureter and renal insufficiency. On 08/14/15 the patient underwent cystoscopy with bilateral ureteral stent placement and right ureteroscopy with laser lithotripsy. He presents today for left percutaneous nephrostomy/ nephroureteral catheter placement prior to planned nephrolithotomy.  Past Medical History  Diagnosis Date  . Panic disorder 2002  . Pure hypercholesterolemia   . DDD (degenerative disc disease), lumbar   . Anxiety   . History of alcohol abuse     QUIT 1993  . Mild asthma   . History of kidney stones   . Right ureteral stone   . DJD (degenerative joint disease)   . History of diverticulosis   . Renal calculus     RIGHT LOWER POLE  . Chronic back pain   . PONV (postoperative nausea and vomiting)   . Renal stone 08/04/2013  . Colon polyps 05/2015    tubular adenoma; diverticulosis; poor prep--repeat 1 year (Dr. Wynetta Emery)    Past Surgical History  Procedure Laterality Date  . Appendectomy  age 63  . Tonsillectomy and adenoidectomy  age 54  . Left heel spur surgery  1989  . Nasal polyp surgery  1993,repeat 2006  . Elbow surgery Right 1995  &   1996  . Laparoscopic cholecystectomy  1998  . Total hip arthroplasty Left 04/26/2010    Dr. Wynelle Link  . Total hip revision  12/01/2011    Procedure: TOTAL HIP REVISION;  Surgeon: Dione Plover Aluisio;  Location: [facility];  Service: Orthopedics;  Laterality: Left;  . Excision/release bursa hip  10/26/2012    Procedure: EXCISION/RELEASE BURSA HIP;  Surgeon: Gearlean Alf, MD;  Location: [facility];  Service: Orthopedics;  Laterality: Left;   Left Hip Bursectomy with Tendon Repair  . Tendon repair  10/26/2012    Procedure: TENDON REPAIR;  Surgeon: Gearlean Alf, MD;  Location: [facility];  Service: Orthopedics;  Laterality: Left;  . Knee arthroscopy w/ meniscectomy  [place]  . Cervical fusion  1993  . Left ureteroscopic stone extraction  04-17-2009  . Right ureteroscopic holium lasertripsy/ stent placement  05-15-2011  . Shoulder arthroscopy with rotator cuff repair and subacromial decompression Left 10-05-2008    AND DEBRIDEMENT LABRAL  . Closed left shoulder manipulation/ excision lipoma  02-15-2009  . Cystoscopy with retrograde pyelogram, ureteroscopy and stent placement Right 08/04/2013    Procedure: Media;  Surgeon: Malka So, MD;  Location: [facility];  Service: Urology;  Laterality: Right;  URETEROSCOPY WITH LASER APPLICATION  . Joint replacement    . Colonoscopy with propofol N/A 06/19/2015    Procedure: COLONOSCOPY WITH PROPOFOL;  Surgeon: Garlan Fair, MD;  Location: [facility];  Service: Endoscopy;  Laterality: N/A;  . Cystoscopy with retrograde pyelogram, ureteroscopy and stent placement Right 08/14/2015    Procedure: CYSTOSCOPY WITH RETROGRADE PYELOGRAM, DIGITAL URETEROSCOPY AND STENT PLACEMENT;  Surgeon: Alexis Frock, MD;  Location: [facility];  Service: Urology;  Laterality: Right;  . Holmium laser application Right 5/40/9811    Procedure: HOLMIUM LASER APPLICATION;  Surgeon: Alexis Frock, MD;  Location: [facility];  Service: Urology;  Laterality: Right;  . Cystoscopy w/ ureteral stent placement  Left 08/14/2015    Procedure: CYSTOSCOPY WITH RETROGRADE PYELOGRAM/URETERAL STENT PLACEMENT;  Surgeon: Alexis Frock, MD;  Location: [facility];  Service: Urology;  Laterality: Left;    Allergies: Review of patient's allergies indicates no known allergies.  Medications: Prior to Admission medications   Medication Sig Start Date End Date Taking? Authorizing Provider  albuterol (PROVENTIL  HFA;VENTOLIN HFA) 108 (90 BASE) MCG/ACT inhaler Inhale 2 puffs into the lungs every 6 (six) hours as needed. Patient taking differently: Inhale 2 puffs into the lungs every 6 (six) hours as needed for wheezing or shortness of breath.  12/19/14   Rita Ohara, MD  ALPRAZolam Duanne Moron) 0.5 MG tablet Take 0.5-1 tablets (0.25-0.5 mg total) by mouth 3 (three) times daily as needed for anxiety or sleep. 02/12/15   Rita Ohara, MD  clindamycin (CLEOCIN) 300 MG capsule Take 600 mg by mouth daily as needed (1-2 hours prior to dental work).  01/04/15   Historical Provider, MD  diphenhydramine-acetaminophen (TYLENOL PM) 25-500 MG TABS Take 1 tablet by mouth at bedtime as needed (insomnia or pain).    Historical Provider, MD  docusate sodium (COLACE) 100 MG capsule Take 1 capsule (100 mg total) by mouth 2 (two) times daily. 08/14/15   Star Age, MD  FLOVENT HFA 110 MCG/ACT inhaler INHALE 1 PUFF INTO THE LUNGS 2 (TWO) TIMES DAILY. Patient taking differently: Inhale 1 puff into the lungs once a day. 02/19/15   Rita Ohara, MD  fluticasone (FLONASE) 50 MCG/ACT nasal spray USE 2 SPRAYS IN EACH NOSTRIL ONCE DAILY Patient taking differently: Use 1 spray each nostril once daily 08/01/15   Rita Ohara, MD  HYDROcodone-acetaminophen (NORCO/VICODIN) 5-325 MG per tablet Take 1-2 tablets by mouth every 6 (six) hours as needed. Patient not taking: Reported on 08/28/2015 08/14/15   Star Age, MD  HYDROmorphone (DILAUDID) 2 MG tablet Take 2 mg by mouth every 4 (four) hours as needed for moderate pain or severe pain.    Historical Provider, MD  levocetirizine (XYZAL) 5 MG tablet TAKE 1 TABLET (5 MG TOTAL) BY MOUTH DAILY WITH BREAKFAST. Patient taking differently: Take 5 mg by mouth daily. Only takes April through June. 04/25/15   Rita Ohara, MD  Multiple Vitamin (MULTIVITAMIN WITH MINERALS) TABS Take 1 tablet by mouth every morning.     Historical Provider, MD  oxybutynin (DITROPAN) 5 MG tablet Take 1 tablet (5 mg total) by mouth 3 (three)  times daily as needed for bladder spasms. 08/14/15   Star Age, MD  PARoxetine (PAXIL) 40 MG tablet TAKE 1 TABLET EVERY MORNING 02/12/15   Rita Ohara, MD  phenazopyridine (PYRIDIUM) 200 MG tablet Take 1 tablet (200 mg total) by mouth 3 (three) times daily as needed for pain. Patient taking differently: Take 100 mg by mouth 3 (three) times daily as needed (urination pain).  08/15/15   Irine Seal, MD  tamsulosin (FLOMAX) 0.4 MG CAPS capsule Take 1 capsule (0.4 mg total) by mouth at bedtime. 08/14/15   Star Age, MD     Family History  Problem Relation Age of Onset  . Hypertension Mother   . Anxiety disorder Mother   . Cancer Mother     male cancer  . Cancer Father     colon  . Colon cancer Father   . Panic disorder Daughter   . Diverticulosis Daughter   . Cancer Maternal Grandmother     breast and stomach  . Heart disease Maternal Grandmother   . Cancer Paternal Grandmother 27    colon cancer  .  Colon cancer Paternal Grandmother   . Alcohol abuse Brother   . Diabetes Neg Hx     Social History   Social History  . Marital Status: Divorced    Spouse Name: N/A  . Number of Children: 2  . Years of Education: N/A   Occupational History  . retired from [employer]  . Smoking status: Never Smoker   . Smokeless tobacco: Never Used  . Alcohol Use: No     Comment: quit alcohol 04/20/1992  . Drug Use: No  . Sexual Activity: Not Currently   Other Topics Concern  . Not on file   Social History Narrative   Lives alone, 1 dog (cocoa, 45 yo).  Children are in [place], [place], 3 grandchildren      Review of Systems  Constitutional: Negative for fever and chills.  Respiratory: Negative for cough and shortness of breath.   Cardiovascular: Negative for chest pain.  Gastrointestinal: Negative for vomiting, abdominal pain and blood in stool.       Occasional nausea  Genitourinary: Positive for dysuria, hematuria and flank pain.  Musculoskeletal:        Occasional back pain  Neurological: Negative for headaches.    Vital Signs: Blood pressure 134/92, temperature 98.2, heart rate 81, respirations 16, oxygen saturation is 99% room air  Physical Exam  Constitutional: He is oriented to person, place, and time. He appears well-developed and well-nourished.  Cardiovascular: Normal rate and regular rhythm.   Pulmonary/Chest: Effort normal and breath sounds normal.  Abdominal: Soft. Bowel sounds are normal. There is no tenderness.  Musculoskeletal: Normal range of motion. He exhibits no edema.  Neurological: He is alert and oriented to person, place, and time.    Mallampati Score:     Imaging: No results found.  Labs:  CBC:  Recent Labs  02/12/15 1041 08/31/15 1430  WBC 4.7 5.3  HGB 14.5 13.1  HCT 41.3 38.3*  PLT 206 251    COAGS:  Recent Labs  09/04/15 0800  INR 1.13  APTT 23*    BMP:  Recent Labs  02/12/15 1041 08/14/15 1630 08/15/15 0431 08/31/15 1430  NA 141 135 138 140  K 4.3 3.7 4.8 4.1  CL 105 102 108 105  CO2 28 27 27 28   GLUCOSE 99 102* 155* 108*  BUN 13 20 23* 13  CALCIUM 9.5 8.7* 8.7* 9.2  CREATININE 1.05 2.04* 1.84* 1.20  GFRNONAA  --  32* 36* >60  GFRAA  --  37* 42* >60    LIVER FUNCTION TESTS:  Recent Labs  02/12/15 1041  BILITOT 0.5  AST 22  ALT 18  ALKPHOS 56  PROT 6.7  ALBUMIN 4.4    TUMOR MARKERS: No results for input(s): AFPTM, CEA, CA199, CHROMGRNA in the last 8760 hours.  Assessment and Plan: Larry Calixto. is a 69 y.o. male with history of recurrent nephrolithiasis who was recently admitted to the hospital on 8/23 with diagnoses right ureteral stone and left ureteral stone with bilateral hydroureter and renal insufficiency. On 08/14/15 the patient underwent cystoscopy with bilateral ureteral stent placement and right ureteroscopy with laser lithotripsy. He presents today for left percutaneous nephrostomy/ nephroureteral catheter placement prior to planned  nephrolithotomy. Details/risks of procedure, including but not limited to, internal bleeding, infection, injury to adjacent organs, discussed with patient with his understanding and consent.   Thank you for this interesting consult.  I greatly enjoyed meeting [employer] and look forward  to participating in their care.  A copy of this report was sent to the requesting provider on this date.  Signed: D. Rowe Robert 09/04/2015, 8:41 AM   I spent a total of  15 minutes  in face to face in clinical consultation, greater than 50% of which was counseling/coordinating care for left percutaneous nephrostomy/nephroureteral catheter placement

## 2015-09-04 NOTE — Interval H&P Note (Signed)
History and Physical Interval Note:  09/04/2015 10:17 AM  [name].  has presented today for surgery, with the diagnosis of LEFT UPJ STONE   The various methods of treatment have been discussed with the patient and family. After consideration of risks, benefits and other options for treatment, the patient has consented to  Procedure(s): 1ST STAGE LEFT PERCUTANEOUS NEPHROLITHOTOMY  (Left) CYSTOSCOPY WITH STENT REMOVAL (Left) HOLMIUM LASER APPLICATION (Left) as a surgical intervention .  The patient's history has been reviewed, patient examined, no change in status, stable for surgery.  I have reviewed the patient's chart and labs.  Questions were answered to the patient's satisfaction.     Siya Flurry J

## 2015-09-04 NOTE — H&P (View-Only) (Signed)
[name] is an 69 y.o. male.    Chief Complaint: Pre-Op Cysto Bilateral Stent Placement, Possible Rt Ureteroscopy  / Laser LIthotripsy  HPI:   1 - Bilateral Ureteral Stones - Rt 6mm proximal with mild hydro as well as left 13mm proximal with severl left renal sotnes and mod-severe hydro by CT 8/23 on eval colickly flank pain in setting of prior renal colic.  Still voiding.  Today Bennet is seen to proceed with cysto and bilateral stent placement, possible right ureteroscopic stone manipulation.   Past Medical History  Diagnosis Date  . Panic disorder 2002  . Pure hypercholesterolemia   . DDD (degenerative disc disease), lumbar   . Anxiety   . History of alcohol abuse     QUIT 1993  . Mild asthma   . History of kidney stones   . Right ureteral stone   . DJD (degenerative joint disease)   . History of diverticulosis   . Renal calculus     RIGHT LOWER POLE  . Chronic back pain   . PONV (postoperative nausea and vomiting)   . Renal stone 08/04/2013  . Colon polyps 05/2015    tubular adenoma; diverticulosis; poor prep--repeat 1 year (Dr. Johnson)    Past Surgical History  Procedure Laterality Date  . Appendectomy  age 9  . Tonsillectomy and adenoidectomy  age 9  . Left heel spur surgery  1989  . Nasal polyp surgery  1993,repeat 2006  . Elbow surgery Right 1995  &   1996  . Laparoscopic cholecystectomy  1998  . Total hip arthroplasty Left 04/26/2010    Dr. Aluisio  . Total hip revision  12/01/2011    Procedure: TOTAL HIP REVISION;  Surgeon: Frank V Aluisio;  Location: [facility];  Service: Orthopedics;  Laterality: Left;  . Excision/release bursa hip  10/26/2012    Procedure: EXCISION/RELEASE BURSA HIP;  Surgeon: Frank V Aluisio, MD;  Location: [facility];  Service: Orthopedics;  Laterality: Left;  Left Hip Bursectomy with Tendon Repair  . Tendon repair  10/26/2012    Procedure: TENDON REPAIR;  Surgeon: Frank V Aluisio, MD;  Location: [facility];  Service: Orthopedics;  Laterality:  Left;  . Knee arthroscopy w/ meniscectomy  1990  &  1996  . Cervical fusion  1993  . Left ureteroscopic stone extraction  04-17-2009  . Right ureteroscopic holium lasertripsy/ stent placement  05-15-2011  . Shoulder arthroscopy with rotator cuff repair and subacromial decompression Left 10-05-2008    AND DEBRIDEMENT LABRAL  . Closed left shoulder manipulation/ excision lipoma  02-15-2009  . Cystoscopy with retrograde pyelogram, ureteroscopy and stent placement Right 08/04/2013    Procedure: STONE BASKETRY AND STENT PLACEMENT;  Surgeon: [name] J Wrenn, MD;  Location: [facility];  Service: Urology;  Laterality: Right;  URETEROSCOPY WITH LASER APPLICATION  . Joint replacement    . Colonoscopy with propofol N/A 06/19/2015    Procedure: COLONOSCOPY WITH PROPOFOL;  Surgeon: Martin K Johnson, MD;  Location: [facility];  Service: Endoscopy;  Laterality: N/A;    Family History  Problem Relation Age of Onset  . Hypertension Mother   . Anxiety disorder Mother   . Cancer Mother     male cancer  . Cancer Father     colon  . Colon cancer Father   . Panic disorder Daughter   . Diverticulosis Daughter   . Cancer Maternal Grandmother     breast and stomach  . Heart disease Maternal Grandmother   . Cancer Paternal Grandmother   87    colon cancer  . Colon cancer Paternal Grandmother   . Alcohol abuse Brother   . Diabetes Neg Hx    Social History:  reports that he has never smoked. He has never used smokeless tobacco. He reports that he does not drink alcohol or use illicit drugs.  Allergies: No Known Allergies  No prescriptions prior to admission    No results found for this or any previous visit (from the past 48 hour(s)). No results found.  Review of Systems  Constitutional: Negative.  Negative for fever and chills.  HENT: Negative.   Eyes: Negative.   Respiratory: Negative.   Cardiovascular: Negative.   Gastrointestinal: Negative.   Genitourinary: Positive for urgency and flank pain.   Musculoskeletal: Negative.   Skin: Negative.   Neurological: Negative.   Endo/Heme/Allergies: Negative.   Psychiatric/Behavioral: Negative.     There were no vitals taken for this visit. Physical Exam  Constitutional: He appears well-developed.  HENT:  Head: Normocephalic.  Eyes: Pupils are equal, round, and reactive to light.  Neck: Normal range of motion.  Cardiovascular: Normal rate.   Respiratory: Effort normal.  GI: Soft.  Genitourinary:  Moderate bilateral CVAT  Musculoskeletal: Normal range of motion.  Neurological: He is alert.  Skin: Skin is warm.  Psychiatric: He has a normal mood and affect. His behavior is normal. Judgment and thought content normal.     Assessment/Plan  1 - Bilateral Ureteral Stones - proceed as planned with urgent bilateral renal decompression with JJ stents, observe in house post-op to monitor for post-obstructive diuresis / serial labs. Will attempt right sided laser lithotripsy as long as anatomy and clinical situaion favorable. HE understands this will be staged approach.  Risks, benefits, alternatives discussed in office by my collegue Dr. Wrenn and reitterated by me.    Toddy Boyd 08/14/2015, 12:52 PM    

## 2015-09-05 ENCOUNTER — Observation Stay (HOSPITAL_COMMUNITY): Payer: Medicare Other

## 2015-09-05 DIAGNOSIS — N132 Hydronephrosis with renal and ureteral calculous obstruction: Secondary | ICD-10-CM | POA: Diagnosis not present

## 2015-09-05 LAB — BASIC METABOLIC PANEL
Anion gap: 4 — ABNORMAL LOW (ref 5–15)
BUN: 9 mg/dL (ref 6–20)
CALCIUM: 8.6 mg/dL — AB (ref 8.9–10.3)
CO2: 28 mmol/L (ref 22–32)
CREATININE: 1.06 mg/dL (ref 0.61–1.24)
Chloride: 106 mmol/L (ref 101–111)
GFR calc non Af Amer: >60 mL/min (ref >60)
Glucose, Bld: 157 mg/dL — ABNORMAL HIGH (ref 65–99)
Potassium: 4.7 mmol/L (ref 3.5–5.1)
SODIUM: 138 mmol/L (ref 135–145)

## 2015-09-05 LAB — HEMOGLOBIN AND HEMATOCRIT, BLOOD
HEMATOCRIT: 33.1 % — AB (ref 39.0–52.0)
Hemoglobin: 11.5 g/dL — ABNORMAL LOW (ref 13.0–17.0)

## 2015-09-05 NOTE — Progress Notes (Signed)
Patient ID: Kirtan Sada., male   DOB: [birth year], 69 y.o.   MRN: [id number] 1 Day Post-Op  Subjective: He is doing well post op with minimal pain.   His urine is clearing and both tubes are draining well.  KUB today shows no residual stones.  ROS:  Review of Systems  Constitutional: Negative for fever.  Respiratory: Negative for shortness of breath.   Cardiovascular: Negative for chest pain and leg swelling.  Gastrointestinal: Negative for nausea.    Anti-infectives: Anti-infectives    Start     Dose/Rate Route Frequency Ordered Stop   09/04/15 2000  ciprofloxacin (CIPRO) tablet 500 mg     500 mg Oral 2 times daily 09/04/15 1456 09/05/15 1959      Current Facility-Administered Medications  Medication Dose Route Frequency Provider Last Rate Last Dose  . acetaminophen (OFIRMEV) IV 1,000 mg  1,000 mg Intravenous Q6H Irine Seal, MD   1,000 mg at 09/05/15 0147  . acetaminophen (TYLENOL) tablet 650 mg  650 mg Oral Q4H PRN Irine Seal, MD   650 mg at 09/04/15 2157  . albuterol (PROVENTIL) (2.5 MG/3ML) 0.083% nebulizer solution 3 mL  3 mL Inhalation Q6H PRN Irine Seal, MD      . ALPRAZolam Duanne Moron) tablet 0.25-0.5 mg  0.25-0.5 mg Oral TID PRN Irine Seal, MD      . bisacodyl (DULCOLAX) suppository 10 mg  10 mg Rectal Daily PRN Irine Seal, MD      . budesonide (PULMICORT) nebulizer solution 0.25 mg  0.25 mg Nebulization BID Irine Seal, MD   0.25 mg at 09/05/15 0721  . ciprofloxacin (CIPRO) tablet 500 mg  500 mg Oral BID Irine Seal, MD   500 mg at 09/04/15 2021  . dextrose 5 % and 0.45 % NaCl with KCl 20 mEq/L infusion   Intravenous Continuous Irine Seal, MD 100 mL/hr at 09/05/15 0143    . diphenhydrAMINE (BENADRYL) injection 12.5 mg  12.5 mg Intravenous Q6H PRN Irine Seal, MD       Or  . diphenhydrAMINE (BENADRYL) 12.5 MG/5ML elixir 12.5 mg  12.5 mg Oral Q6H PRN Irine Seal, MD      . docusate sodium (COLACE) capsule 100 mg  100 mg Oral BID Irine Seal, MD   100 mg at 09/05/15 0148  .  fluticasone (FLONASE) 50 MCG/ACT nasal spray 2 spray  2 spray Each Nare Daily Irine Seal, MD   2 spray at 09/04/15 1600  . HYDROmorphone (DILAUDID) injection 0.5-1 mg  0.5-1 mg Intravenous Q2H PRN Irine Seal, MD   1 mg at 09/05/15 0250  . HYDROmorphone (DILAUDID) tablet 2 mg  2 mg Oral Q4H PRN Irine Seal, MD   2 mg at 09/05/15 0148  . magnesium hydroxide (MILK OF MAGNESIA) suspension 30 mL  30 mL Oral Daily PRN Irine Seal, MD      . ondansetron [facility]) injection 4 mg  4 mg Intravenous Q4H PRN Irine Seal, MD      . oxybutynin (DITROPAN) tablet 5 mg  5 mg Oral TID PRN Irine Seal, MD      . PARoxetine (PAXIL) tablet 40 mg  40 mg Oral Daily Irine Seal, MD      . phenazopyridine (PYRIDIUM) tablet 100 mg  100 mg Oral TID PRN Irine Seal, MD      . sodium phosphate (FLEET) 7-19 GM/118ML enema 1 enema  1 enema Rectal Once PRN Irine Seal, MD      . zolpidem [facility]) tablet 5 mg  5  mg Oral QHS PRN Irine Seal, MD         Objective: Vital signs in last 24 hours: Temp:  [97.6 F (36.4 C)-98.3 F (36.8 C)] 97.6 F (36.4 C) (09/14 0533) Pulse Rate:  [55-89] 62 (09/14 0722) Resp:  [11-18] 16 (09/14 0722) BP: (104-149)/(44-82) 104/59 mmHg (09/14 0533) SpO2:  [98 %-100 %] 98 % (09/14 0722) Weight:  [87.816 kg (193 lb 9.6 oz)] 87.816 kg (193 lb 9.6 oz) (09/13 1448)  Intake/Output from previous day: 09/13 0701 - 09/14 0700 In: 5217.5 [P.O.:940; I.V.:3977.5; IV Piggyback:300] Out: 2700 [Urine:2700] Intake/Output this shift:     Physical Exam  Constitutional: He is well-developed, well-nourished, and in no distress.  Cardiovascular: Normal rate, regular rhythm and normal heart sounds.   Pulmonary/Chest: Effort normal and breath sounds normal. No respiratory distress.  Abdominal: Soft. There is no tenderness.  Musculoskeletal: Normal range of motion. He exhibits no edema or tenderness.    Lab Results:   Recent Labs  09/04/15 0800 09/04/15 1400 09/05/15 0441  WBC 5.6  --   --   HGB 12.6*  12.2* 11.5*  HCT 36.4* 35.2* 33.1*  PLT 199  --   --    BMET  Recent Labs  09/04/15 1400 09/05/15 0441  NA 139 138  K 3.9 4.7  CL 105 106  CO2 30 28  GLUCOSE 143* 157*  BUN 12 9  CREATININE 1.29* 1.06  CALCIUM 8.5* 8.6*   PT/INR  Recent Labs  09/04/15 0800  LABPROT 14.7  INR 1.13   ABG No results for input(s): PHART, HCO3 in the last 72 hours.  Invalid input(s): PCO2, PO2  Studies/Results: Abdomen 1 View (kub)  09/05/2015   CLINICAL DATA:  Percutaneous nephrolithotomy.  EXAM: ABDOMEN - 1 VIEW  COMPARISON:  08/14/2015.  FINDINGS: Large caliber nephrostomy catheter and nephro ureteral catheter noted on the left. Previously identified large stone has been removed. Surgical clips right upper quadrant. No bowel distention. Stool noted throughout the colon. Degenerative changes lumbar spine. Total left hip replacement.  IMPRESSION: 1. Interim removal of prominent left renal stone. No definite residual fragments noted. 2. Large caliber left nephrostomy catheter and left nephro ureteral catheter noted in good anatomic position.   Electronically Signed   By: Marcello Moores  Register   On: 09/05/2015 08:07   Dg C-arm 61-120 Min-no Report  09/04/2015   CLINICAL DATA: left kidney stone   C-ARM 61-120 MINUTES  Fluoroscopy was utilized by the requesting physician.  No radiographic  interpretation.    Ir Ureteral Stent Left New Access W/o Sep Nephrostomy Cath  09/04/2015   CLINICAL DATA:  69 year old male with nephrolithiasis and bilateral ureteral stones. Hydronephrosis is worse on the left than the right. He presents for left-sided percutaneous nephrostomy access and insertion of a a ureteral catheter prior to percutaneous nephrolithotomy.  EXAM: IR URETERAL STENT LEFT NEW ACCESS W/O SEP NEPHROSTOMY CATH  Date: 09/04/2015  PROCEDURE: 1. Percutaneous puncture of the renal collecting system under fluoroscopic guidance 2. Placement of a percutaneous nephrostomy tube Operating Physician:  Criselda Peaches, MD  COMPARISON:  CT of the abdomen and pelvis - 08/14/2015  ANESTHESIA/SEDATION: Moderate (conscious) sedation was used. 6 mg Versed, 150 mcg Fentanyl were administered intravenously. The patient's vital signs were monitored continuously by radiology nursing throughout the procedure. 400 mg Cipro was administered IV at an appropriate time prior to skin puncture.  8 mg Zofran were also administered preprocedure.  Sedation Time: 30 minutes  FLUOROSCOPY TIME:  6 minutes  36 seconds  76 mGy  CONTRAST:  15 mL Omnipaque 300 instilled into the collecting system.  TECHNIQUE: Informed written consent was obtained from the patient after a discussion of the risks, benefits, and alternatives to treatment. The flank region was prepped with Betadine in a sterile fashion, and a sterile drape was applied covering the operative field. A sterile gown and sterile gloves were used for the procedure. A timeout was performed prior to the initiation of the procedure.  Ultrasound interrogation of the left flank demonstrates a hydronephrotic kidney. A suitable posterior inferior calyx was selected. Local anesthesia was attained by infiltration with 1% lidocaine. A small dermatotomy was made. Under real-time sonographic guidance, the posterior inferior calyx was punctured with a Zuehl needle. Access to the collecting system was confirmed with advancement of a Nitrex wire into the collecting system. The needle was exchanged for the Accustick sheath. An Accustick set was utilized to dilate the tract and was subsequently exchanged for a Kumpe catheter over a Bentson wire. The Kumpe catheter was advanced down the ureter and into the urinary bladder. Postprocedural spot radiographs were obtained in various obliquities and the catheter was sutured to the skin. The catheter was capped and a dressing was placed. The patient tolerated the procedure well without immediate postprocedural complication.  COMPLICATIONS: None immediate   IMPRESSION: Successful fluoroscopic guided left percutaneous nephrostomy with placement of a 5 French Kumpe catheter to the level of the urinary bladder to be utilized during impending nephrolithotomy procedure.  Signed,  Criselda Peaches, MD  [facility]  [facility]   Electronically Signed   By: Jacqulynn Cadet M.D.   On: 09/04/2015 12:44   KUB films reviewed.  Report was not back.    Assessment and Plan: He is doing well post left PCNL.  I would like to keep the tube for another day and he doesn't have good support at home so I will keep him until tomorrow.  D/C foley and increase ambulation.  Heplock IV.           Johnye Kist J 09/05/[id number]

## 2015-09-05 NOTE — Care Management Note (Signed)
Case Management Note  Patient Details  Name: Tauno Falotico. MRN: [id number] Date of Birth: 1946/10/21  Subjective/Objective:   69 y/o m admitted w/L nephrolithiasis. From home.                 Action/Plan:d/c plan home.   Expected Discharge Date:                  Expected Discharge Plan:  Home/Self Care  In-House Referral:     Discharge planning Services  CM Consult  Post Acute Care Choice:    Choice offered to:     DME Arranged:    DME Agency:     HH Arranged:    HH Agency:     Status of Service:  In process, will continue to follow  Medicare Important Message Given:    Date Medicare IM Given:    Medicare IM give by:    Date Additional Medicare IM Given:    Additional Medicare Important Message give by:     If discussed at [place] of Stay Meetings, dates discussed:    Additional Comments:  Dessa Phi, RN 09/05/2015, 12:58 PM

## 2015-09-05 NOTE — Op Note (Signed)
[name], [name]              ACCOUNT NO.:  [id number]  MEDICAL RECORD NO.:  [id number]  LOCATION:  1406                         FACILITY:  [facility]  PHYSICIAN:  Marshall Cork. Jeffie Pollock, M.D.    DATE OF BIRTH:  Aug 25, 1946  DATE OF PROCEDURE:  09/04/2015 DATE OF DISCHARGE:                              OPERATIVE REPORT   PROCEDURE: 1. Cystoscopy with removal of bilateral double-J stents. 2. Left percutaneous nephrolithotomy for less than 2 cm stone, first     stage. 3. Antegrade nephrostogram with interpretation.  PREOPERATIVE DIAGNOSES:  Bilateral ureteral stents and a less than 2 cm left UPJ stone with small left lower pole stones.  POSTOPERATIVE DIAGNOSES:  Bilateral ureteral stents and a less than 2 cm left UPJ stone with small left lower pole stones.  SURGEON:  Marshall Cork. Jeffie Pollock, M.D.  ANESTHESIA:  General.  SPECIMEN:  Stone fragments and stents.  BLOOD LOSS:  Approximately 50 mL to 100 mL.  DRAINS:  A 16-French Foley catheter.  A 24-French Ainsworth left nephrostomy tube and 6-French Kumpe left nephrostomy tube.  COMPLICATIONS:  None.  INDICATIONS:  Mr. Mancha is a 69 year old white male, who was found a few weeks ago to have a right ureteral stone and a large left UPJ stone with lower pole stones.  He had previously undergone a right ureteroscopy with stone removal for his acute issue and underwent placement of bilateral double-J stents.  He returns now for definitive treatment of the left UPJ and lower pole stones with percutaneous nephrolithotomy.  FINDINGS OF PROCEDURE:  He had undergone placement of left nephrostomy access earlier today and received Cipro for that procedure.  He was taken to the operating room where a general anesthetic was induced on the holding room stretcher.  His genitalia were prepped with Betadine solution and he was draped with sterile towels.  Flexible cystoscopy was performed to remove both of the Polaris double-J stents that had been placed  previously.  The 16-French Foley catheter was then inserted.  The balloon was filled with 10 mL of sterile fluid and placed to straight drainage.  The patient was then fitted with PAS hose and rolled prone on chest rolls, with care taken to pad all pressure points.  His nephrostomy site was prepped with ChloraPrep and he was draped in the usual sterile fashion.  An Amplatz Super Stiff guidewire was placed through the nephrostomy catheter to the bladder under fluoroscopic guidance and the nephrostomy catheter was removed.  The nephrostomy site incision was enlarged with a knife to 2 cm and a dual lumen catheter was placed over the wire into the proximal ureter where a 2nd wire was then advanced to the bladder.  At this point, the dual-lumen catheter was removed.  The NephroMax 30- French balloon was then passed over the working wire and inflated to 18 atmospheres.  The nephrostomy sheath was then advanced into the renal pelvis and the balloon was deflated.  A rigid nephroscopy was then performed and several stones were removed with 2-prong graspers.  I was able to find the stones in the lower pole along the access tract and those were removed the 2-prong graspers. However, I was unable to angle  the scope into the area of the UPJ stone since it was more proximal ureter than true UPJ.  At this point, the 19-French flexible nephroscope was inserted and I was able to get this around the corner to visualize the stone.  The stone was then engaged with a 375 micron holmium laser fiber at 0.5 watts and 20 hertz.  Initial fragmentation was performed, however, I was not able to bring the laser to bear on the entire stone.  At this point, the dual-lumen digital flexible ureteroscope was used with a 200 micron TracTip laser fiber and I was able to further fragment the stone.  However, there was 1 section that once again, I could not bring the laser to bear on as had desired.  I then switched  back to the flexible nephroscope and with considerable effort, was able to engage the larger residual fragment with a Zero Tip basket and removed it.  Several other fragments were removed as well. The bulk of the fragments had flushed out the nephrostomy sheath.  A final inspection with both the rigid and flexible scopes revealed no significant residual fragments.  There was some dust that I could not remove with available instrumentation.  At this point, a 6-French Kumpe catheter was inserted over the safety wire.  A short catheter was used and it was placed just above the bladder.  A 24-French Ainsworth Foley was then placed over the working wire through the nephrostomy sheath into the renal pelvis, and the sheath was backed out.  The Kumpe catheter and the nephrostomy catheter were secured to the skin with 2-0 silk suture.  He did have some brisk bleeding immediately after removing the sheath.  So pressure was held for 5 minutes and that resulted in resolution of the bleeding.  The working wire was then removed from the nephrostomy catheter and contrast was instilled.  This demonstrated the tip of the tube in the renal pelvis.  With antegrade flow, there was a fair amount of clot in the pelvis as well.  At this point, the nephrostomy catheter was placed to straight drainage. The drapes were removed and a dressing of 4x4s, ABDs, and Hypafix was applied.  The Kumpe catheter had been capped.  The patient was then rolled supine on a recovery room stretcher.  His anesthetic was reversed and he was moved to recovery room in stable condition.  There were no complications.     Marshall Cork. Jeffie Pollock, M.D.     JJW/MEDQ  D:  09/04/2015  T:  09/05/2015  Job:  [id number]

## 2015-09-06 DIAGNOSIS — N132 Hydronephrosis with renal and ureteral calculous obstruction: Secondary | ICD-10-CM | POA: Diagnosis not present

## 2015-09-06 MED ORDER — HYDROMORPHONE HCL 2 MG PO TABS
2.0000 mg | ORAL_TABLET | ORAL | Status: DC | PRN
Start: 2015-09-06 — End: 2015-12-06

## 2015-09-06 NOTE — Progress Notes (Signed)
Patient ID: [name]., male   DOB: 1946-10-21, 69 y.o.   MRN: [id number] [name]'s discharge was held because he had severe pain and marked flank drainage.  Pain is now controlled and we have placed an ostomy bag over the site.   He continues to drain but it is declining per nurse report and the urine is clear.   He did pass a clot early on.  Will see how he is in the morning and will consider discharge but since he lives alone, he may need an extra day if the drainage has not resolved.   Hopefully he won't need stent replacement.

## 2015-09-06 NOTE — Discharge Instructions (Signed)
Percutaneous Nephrostomy, Care After Percutaneous Nephrolithotomy Kidney stones can cause a great deal of pain. They can block urine from leaving the body. And they can lead to infection. Kidney stones might pass on their own, or they can be broken up by shock waves from a special machine. But sometimes surgery is needed to get rid of kidney stones. One type of surgery is called percutaneous nephrolithotomy. "Nephro" means kidney, "litho" means stone and "tomy" means removal by surgery. "Percutaneous" means through the skin. This type of surgery needs only a small cut (incision) in the skin. This surgery may be suggested for various reasons. They include:  The kidney stones are 2 centimeters wide (about 3/4 inch) or bigger. They also might be oddly shaped.  Other treatments were tried, but all or some of the kidney stones remain.  Infection has developed.  No other treatment can be used. LET YOUR CAREGIVER KNOW ABOUT:   Any allergies.  All medications you are taking, including:  Herbs, eyedrops, over-the-counter medications and creams.  Blood thinners (anticoagulants), aspirin or other drugs that could affect blood clotting.  Use of steroids (by mouth or as creams).  Previous problems with anesthetics, including local anesthetics.  Possibility of pregnancy, if this applies.  Any history of blood clots.  Any history of bleeding or other blood problems.  Previous surgery.  Smoking history.  Other health problems. RISKS AND COMPLICATIONS   During surgery:  Sometimes all the kidney stones cannot be removed through the tube. Then, the percutaneous nephrolithotomy procedure would be stopped. Open surgery would be used to remove the remaining stones.  Short-term risks from the surgery could include:  Excessive bleeding.  Blood in your urine.  Holes in the kidney. These usually heal on their own.  Pain.  Redness or tenderness at the incision site.  Numbness (loss of  feeling) in the area treated. Tingling also is possible.  A pooling of blood in the wound (hematoma).  Infection.  Slow healing.  Longer-term possibilities include:  Kidney damage.  Damage to organs near the kidney.  Need for a repeat surgery. BEFORE THE PROCEDURE  You may need to take some tests before your surgery. These might include:  Blood tests.  Urine tests.  Tests to make sure your heart is working properly.  Let your healthcare provider know if you think you might have a urinary tract infection. You will probably need to take an antibiotic to treat this before the surgery.  Two weeks before your surgery, stop using aspirin and non-steroidal anti-inflammatory drugs (NSAIDs) for pain relief. This includes prescription drugs and over-the-counter drugs such as ibuprofen and naproxen. Also stop taking vitamin E.  If you take blood-thinners, ask your healthcare provider when you should stop taking them.  Do not eat or drink for about 8 hours before your surgery.  You might be asked to shower or wash with a special antibacterial soap before the procedure.  Arrive at least an hour before the surgery, or whenever your surgeon recommends. This will give you time to check in and fill out any needed paperwork. PROCEDURE  The preparation:  You will change into a hospital gown.  You will be given an IV. A needle will be inserted in your arm. Medication will be able to flow directly into your body through this needle.  You might be given a sedative. This medication will help you relax.  You may be given a general anesthetic (a drug that will put you to sleep during the  surgery). Or, you may get a local anesthetic (part of your body will be numb, but you will remain awake).  A catheter (tube) will be put in your bladder to drain urine during and after surgery.  The procedure:  The surgeon will make a small incision in your lower back.  A tube will be inserted through  the incision into your kidney.  Each kidney stone is removed through this tube. Larger stones may first be broken up with a laser (a high-intensity light beam) or other tools.  If a kidney stone has already left the kidney, the surgeon would use a special tool to bring it back in. Then it would be removed through the tube.  After all the stones are taken out, a catheter will be put in. Fluid can build up around the kidney as it heals. The catheter lets this fluid drain out of the body.  A dressing (medicine and bandage) will be put on the incision area.  The surgery usually takes three to four hours. AFTER THE PROCEDURE  You will stay in a recovery area until the anesthesia has worn off. Your blood pressure and pulse will be checked often. You might be given more pain medication.  You will be moved to a hospital room for the rest of your stay.  The catheter that is taking urine out of your body will be taken out within 24 hours.  The day after your surgery, you should be able to walk around. Walking helps prevent blood clots (thick clumps that can block the flow of blood).  You may be asked to do some breathing exercises.  You will be able to have only liquids for a day or two.  Before you go home:  The catheter that is draining fluid from the kidney area is usually taken out.  You will be taught how to care for the incision. Be sure to ask how often the dressing should be changed and when it can get wet.  You also will be told what you should and should not do while your kidney and incisions heal. For instance, you may be urged to walk to prevent blood clots. Document Released: 10/05/2009 Document Revised: 03/01/2012 Document Reviewed: 10/05/2009 [facility] Patient Information 2015 [place], [place]. This information is not intended to replace advice given to you by your health care provider. Make sure you discuss any questions you have with your health care provider.

## 2015-09-06 NOTE — Discharge Summary (Addendum)
Physician Discharge Summary  Patient ID: [name]. MRN: [id number] DOB/AGE: 1946/03/11 69 y.o.  Admit date: 09/04/2015 Discharge date: 09/06/2015  Admission Diagnoses:  Left nephrolithiasis  Discharge Diagnoses:  Principal Problem:   Left nephrolithiasis   Past Medical History  Diagnosis Date  . Panic disorder 2002  . Pure hypercholesterolemia   . DDD (degenerative disc disease), lumbar   . Anxiety   . History of alcohol abuse     QUIT 1993  . Mild asthma   . History of kidney stones   . Right ureteral stone   . DJD (degenerative joint disease)   . History of diverticulosis   . Renal calculus     RIGHT LOWER POLE  . Chronic back pain   . PONV (postoperative nausea and vomiting)   . Renal stone 08/04/2013  . Colon polyps 05/2015    tubular adenoma; diverticulosis; poor prep--repeat 1 year (Dr. Wynetta Emery)    Surgeries: Procedure(s): 1ST STAGE LEFT PERCUTANEOUS NEPHROLITHOTOMY  CYSTOSCOPY WITH BILATERAL STENT REMOVAL HOLMIUM LASER APPLICATION on 06/18/3150   Consultants (if any):    Discharged Condition: Improved  Hospital Course: Tobyn Osgood. is an 69 y.o. male who was admitted 09/04/2015 with a diagnosis of Left nephrolithiasis and went to the operating room on 09/04/2015 and underwent the above named procedures.  He had successful removal of the 1.5cm left UPJ/proximal ureteral stone and LLP stones.   He was rendered stone free but had some bleeding and I felt an extra day with the tubes was indicated.   He had a mild post op anemia but his urine was minimally bloody this morning and the tubes were removed.   He was sent home with activity and dressing instructions.   He was given perioperative antibiotics:      Anti-infectives    Start     Dose/Rate Route Frequency Ordered Stop   09/04/15 2000  ciprofloxacin (CIPRO) tablet 500 mg     500 mg Oral 2 times daily 09/04/15 1456 09/05/15 0929    .  He was given sequential compression devices for DVT  prophylaxis.  He benefited maximally from the hospital stay and there were no complications.    Recent vital signs:  Filed Vitals:   09/06/15 0542  BP: 114/64  Pulse: 52  Temp: 98.3 F (36.8 C)  Resp: 20    Recent laboratory studies:  Lab Results  Component Value Date   HGB 11.5* 09/05/2015   HGB 12.2* 09/04/2015   HGB 12.6* 09/04/2015   Lab Results  Component Value Date   WBC 5.6 09/04/2015   PLT 199 09/04/2015   Lab Results  Component Value Date   INR 1.13 09/04/2015   Lab Results  Component Value Date   NA 138 09/05/2015   K 4.7 09/05/2015   CL 106 09/05/2015   CO2 28 09/05/2015   BUN 9 09/05/2015   CREATININE 1.06 09/05/2015   GLUCOSE 157* 09/05/2015    Discharge Medications:     Medication List    TAKE these medications        albuterol 108 (90 BASE) MCG/ACT inhaler  Commonly known as:  PROVENTIL HFA;VENTOLIN HFA  Inhale 2 puffs into the lungs every 6 (six) hours as needed.     ALPRAZolam 0.5 MG tablet  Commonly known as:  XANAX  Take 0.5-1 tablets (0.25-0.5 mg total) by mouth 3 (three) times daily as needed for anxiety or sleep.     clindamycin 300 MG capsule  Commonly known  as:  CLEOCIN  Take 600 mg by mouth daily as needed (1-2 hours prior to dental work).     diphenhydramine-acetaminophen 25-500 MG Tabs  Commonly known as:  TYLENOL PM  Take 1 tablet by mouth at bedtime as needed (insomnia or pain).     docusate sodium 100 MG capsule  Commonly known as:  COLACE  Take 1 capsule (100 mg total) by mouth 2 (two) times daily.     FLOVENT HFA 110 MCG/ACT inhaler  Generic drug:  fluticasone  INHALE 1 PUFF INTO THE LUNGS 2 (TWO) TIMES DAILY.     fluticasone 50 MCG/ACT nasal spray  Commonly known as:  FLONASE  USE 2 SPRAYS IN EACH NOSTRIL ONCE DAILY     HYDROmorphone 2 MG tablet  Commonly known as:  DILAUDID  Take 1 tablet (2 mg total) by mouth every 4 (four) hours as needed for moderate pain or severe pain.     levocetirizine 5 MG tablet   Commonly known as:  XYZAL  TAKE 1 TABLET (5 MG TOTAL) BY MOUTH DAILY WITH BREAKFAST.     multivitamin with minerals Tabs tablet  Take 1 tablet by mouth every morning.     oxybutynin 5 MG tablet  Commonly known as:  DITROPAN  Take 1 tablet (5 mg total) by mouth 3 (three) times daily as needed for bladder spasms.     PARoxetine 40 MG tablet  Commonly known as:  PAXIL  TAKE 1 TABLET EVERY MORNING     phenazopyridine 200 MG tablet  Commonly known as:  PYRIDIUM  Take 1 tablet (200 mg total) by mouth 3 (three) times daily as needed for pain.     tamsulosin 0.4 MG Caps capsule  Commonly known as:  FLOMAX  Take 1 capsule (0.4 mg total) by mouth at bedtime.        Diagnostic Studies: Abdomen 1 View (kub)  09/05/2015   CLINICAL DATA:  Percutaneous nephrolithotomy.  EXAM: ABDOMEN - 1 VIEW  COMPARISON:  08/14/2015.  FINDINGS: Large caliber nephrostomy catheter and nephro ureteral catheter noted on the left. Previously identified large stone has been removed. Surgical clips right upper quadrant. No bowel distention. Stool noted throughout the colon. Degenerative changes lumbar spine. Total left hip replacement.  IMPRESSION: 1. Interim removal of prominent left renal stone. No definite residual fragments noted. 2. Large caliber left nephrostomy catheter and left nephro ureteral catheter noted in good anatomic position.   Electronically Signed   By: Marcello Moores  Register   On: 09/05/2015 08:07   Dg C-arm 61-120 Min-no Report  09/04/2015   CLINICAL DATA: left kidney stone   C-ARM 61-120 MINUTES  Fluoroscopy was utilized by the requesting physician.  No radiographic  interpretation.    Ir Ureteral Stent Left New Access W/o Sep Nephrostomy Cath  09/04/2015   CLINICAL DATA:  70 year old male with nephrolithiasis and bilateral ureteral stones. Hydronephrosis is worse on the left than the right. He presents for left-sided percutaneous nephrostomy access and insertion of a a ureteral catheter prior to  percutaneous nephrolithotomy.  EXAM: IR URETERAL STENT LEFT NEW ACCESS W/O SEP NEPHROSTOMY CATH  Date: 09/04/2015  PROCEDURE: 1. Percutaneous puncture of the renal collecting system under fluoroscopic guidance 2. Placement of a percutaneous nephrostomy tube Operating Physician:  Criselda Peaches, MD  COMPARISON:  CT of the abdomen and pelvis - 08/14/2015  ANESTHESIA/SEDATION: Moderate (conscious) sedation was used. 6 mg Versed, 150 mcg Fentanyl were administered intravenously. The patient's vital signs were monitored continuously by radiology nursing  throughout the procedure. 400 mg Cipro was administered IV at an appropriate time prior to skin puncture.  8 mg Zofran were also administered preprocedure.  Sedation Time: 30 minutes  FLUOROSCOPY TIME:  6 minutes 36 seconds  76 mGy  CONTRAST:  15 mL Omnipaque 300 instilled into the collecting system.  TECHNIQUE: Informed written consent was obtained from the patient after a discussion of the risks, benefits, and alternatives to treatment. The flank region was prepped with Betadine in a sterile fashion, and a sterile drape was applied covering the operative field. A sterile gown and sterile gloves were used for the procedure. A timeout was performed prior to the initiation of the procedure.  Ultrasound interrogation of the left flank demonstrates a hydronephrotic kidney. A suitable posterior inferior calyx was selected. Local anesthesia was attained by infiltration with 1% lidocaine. A small dermatotomy was made. Under real-time sonographic guidance, the posterior inferior calyx was punctured with a [place] needle. Access to the collecting system was confirmed with advancement of a Nitrex wire into the collecting system. The needle was exchanged for the Accustick sheath. An Accustick set was utilized to dilate the tract and was subsequently exchanged for a Kumpe catheter over a Bentson wire. The Kumpe catheter was advanced down the ureter and into the urinary  bladder. Postprocedural spot radiographs were obtained in various obliquities and the catheter was sutured to the skin. The catheter was capped and a dressing was placed. The patient tolerated the procedure well without immediate postprocedural complication.  COMPLICATIONS: None immediate  IMPRESSION: Successful fluoroscopic guided left percutaneous nephrostomy with placement of a 5 French Kumpe catheter to the level of the urinary bladder to be utilized during impending nephrolithotomy procedure.  Signed,  Criselda Peaches, MD  [facility]  [facility]   Electronically Signed   By: Jacqulynn Cadet M.D.   On: 09/04/2015 12:44    Disposition: 01-Home or Self Care  Discharge Instructions    Discontinue IV    Complete by:  As directed            Follow-up Information    Follow up with Malka So, MD On 09/11/2015.   Specialty:  Urology   Why:  as scheduled   Contact information:   [place] [address] [phone]      Mr. Camaron stayed an extra day because of pain and flank drainage but is improved.   He will go home with the ostomy bag on the NT site but should remove it when the drainage is down to 10-63ml/8hrs and change to gauze.   SignedMalka So 09/06/2015, 7:28 AM

## 2015-09-07 DIAGNOSIS — N132 Hydronephrosis with renal and ureteral calculous obstruction: Secondary | ICD-10-CM | POA: Diagnosis not present

## 2015-09-07 NOTE — Progress Notes (Signed)
3 Days Post-Op Subjective: Patient reports no pain since last pm. Still modest drainage  Objective: Vital signs in last 24 hours: Temp:  [97.5 F (36.4 C)-98.6 F (37 C)] 97.5 F (36.4 C) (09/16 0550) Pulse Rate:  [57-58] 57 (09/16 0550) Resp:  [16-18] 16 (09/16 0550) BP: (115-142)/(61-85) 142/85 mmHg (09/16 0550) SpO2:  [97 %-100 %] 100 % (09/16 0550)  Intake/Output from previous day: 09/15 0701 - 09/16 0700 In: 980 [P.O.:980] Out: 1425 [Urine:1425] Intake/Output this shift:    Physical Exam:  Constitutional: Vital signs reviewed. WD WN in NAD   Eyes: PERRL, No scleral icterus.   Pulmonary/Chest: Normal effort Abdominal: Soft. 30-40 cc of urine in wound drainage bag   Lab Results:  Recent Labs  09/04/15 0800 09/04/15 1400 09/05/15 0441  HGB 12.6* 12.2* 11.5*  HCT 36.4* 35.2* 33.1*   BMET  Recent Labs  09/04/15 1400 09/05/15 0441  NA 139 138  K 3.9 4.7  CL 105 106  CO2 30 28  GLUCOSE 143* 157*  BUN 12 9  CREATININE 1.29* 1.06  CALCIUM 8.5* 8.6*    Recent Labs  09/04/15 0800  INR 1.13   No results for input(s): LABURIN in the last 72 hours. Results for orders placed or performed in visit on 04/13/14  Urine culture     Status: None   Collection Time: 04/13/14  2:14 PM  Result Value Ref Range Status   Colony Count 8,000 COLONIES/ML  Final   Organism ID, Bacteria Insignificant Growth  Final    Studies/Results: No results found.  Assessment/Plan:   S/p left pcnl--pain under control but still moderate urine drainage in bag  Pt not totally comfortable w/ drain bag mgmt--will wait til later today or more likely tomorrow to reassess, as pt lives alone.     Jorja Loa 09/07/2015, 7:52 AM

## 2015-09-07 NOTE — Care Management Note (Signed)
Case Management Note  Patient Details  Name: [name]. MRN: [id number] Date of Birth: [birth year]  Subjective/Objective:                    Action/Plan:d/c home no needs or orders.   Expected Discharge Date:                  Expected Discharge Plan:  Home/Self Care  In-House Referral:     Discharge planning Services  CM Consult  Post Acute Care Choice:    Choice offered to:     DME Arranged:    DME Agency:     HH Arranged:    [place] Agency:     Status of Service:  Completed, signed off  Medicare Important Message Given:    Date Medicare IM Given:    Medicare IM give by:    Date Additional Medicare IM Given:    Additional Medicare Important Message give by:     If discussed at [place] of Stay Meetings, dates discussed:    Additional Comments:  Dessa Phi, RN 09/07/2015, 2:10 PM

## 2015-11-28 ENCOUNTER — Telehealth: Payer: Self-pay | Admitting: Family Medicine

## 2015-11-28 ENCOUNTER — Other Ambulatory Visit: Payer: Self-pay | Admitting: Family Medicine

## 2015-11-28 NOTE — Telephone Encounter (Signed)
Is this ok?

## 2015-11-28 NOTE — Telephone Encounter (Signed)
Phoned in.

## 2015-11-28 NOTE — Telephone Encounter (Signed)
Pt called and stated he needs a refill on xanax sent to [facility] [address]. Pt has cpe in Feb and can be reached at [phone].

## 2015-11-28 NOTE — Telephone Encounter (Signed)
Ok.  See phone message

## 2015-11-28 NOTE — Telephone Encounter (Signed)
Last filled #20 in Feb. Okay for refill

## 2015-11-29 ENCOUNTER — Telehealth: Payer: Self-pay | Admitting: Family Medicine

## 2015-11-29 NOTE — Telephone Encounter (Signed)
Pt dropped off Surgery clearance form for you to sign

## 2015-12-02 NOTE — Telephone Encounter (Signed)
Given his age, and that he hasn't been seen in over 6 months, OV is needed to reassess risk factors prior to giving clearance.  There is no date on the form, so if in no rush for the surgery, we can do at his upcoming physical (not until 2/23, so if he wants surgery sooner than that, need OV; still keep his yearly visit as scheduled). Please get records from [facility], since they haven't sent [place] any notes.  Looks like R shoulder arthroscopy is the planned procedure.

## 2015-12-03 NOTE — Telephone Encounter (Signed)
Spoke with patient and he scheduled for 12/06/15-wants to have done now. Also called over to [facility] [facility] and requesting that they fax records, spoke with [place].

## 2015-12-06 ENCOUNTER — Encounter: Payer: Self-pay | Admitting: Family Medicine

## 2015-12-06 ENCOUNTER — Ambulatory Visit (INDEPENDENT_AMBULATORY_CARE_PROVIDER_SITE_OTHER): Payer: Medicare Other | Admitting: Family Medicine

## 2015-12-06 VITALS — BP 120/74 | HR 64 | Ht 74.25 in | Wt 184.8 lb

## 2015-12-06 DIAGNOSIS — M25511 Pain in right shoulder: Secondary | ICD-10-CM

## 2015-12-06 DIAGNOSIS — R3915 Urgency of urination: Secondary | ICD-10-CM

## 2015-12-06 DIAGNOSIS — Z01818 Encounter for other preprocedural examination: Secondary | ICD-10-CM

## 2015-12-06 DIAGNOSIS — J4520 Mild intermittent asthma, uncomplicated: Secondary | ICD-10-CM

## 2015-12-06 LAB — POCT URINALYSIS DIPSTICK
BILIRUBIN UA: NEGATIVE
Glucose, UA: NEGATIVE
Ketones, UA: NEGATIVE
LEUKOCYTES UA: NEGATIVE
NITRITE UA: NEGATIVE
PH UA: 6.5
PROTEIN UA: NEGATIVE
RBC UA: NEGATIVE
Spec Grav, UA: 1.02
UROBILINOGEN UA: NEGATIVE

## 2015-12-06 NOTE — Progress Notes (Signed)
Chief Complaint  Patient presents with  . Medical Clearance    needing medical clearance for right shoulder arthroscopy.    Patient presents requesting form for medical clearance for shoulder surgery, right shoulder arthroscopic surgery with Dr. Maxie Better.  He reports he hasn't exercised since August due to problems with bilateral kidney stones, requiring surgery and stenting.  While fixing his fence in September he developed pain in his right shoulder (and back), for which he has been seeing Dr. Maxie Better. He had MRI, and given lack of improvement and ongoing pain, surgery was recommended.  He is active playing in the yard with his dog, and stretching and doing home exercises. Denies any chest pain, shortness of breath, palpitations or other concerns with exertion.  He has asthma, and admits that he stopped using his Flovent a little over a week ago.  He hasn't needed to use the albuterol since stopping.  He had the urologic procedures in August/September with anesthesia without any complications.  Today he is complaining of some urinary urgency.  No change in appearance (he can't smell) of the urine, no blood.  Denies dysuria. Some urinary frequency. No flank or abdominal pain.  Sometimes the stream is strong, sometimes there is a hesitancy.  He is due to  f/u with Dr. Jeffie Pollock in the spring.  PMH, PSH, SH and FH were updated and reviewed today.  Outpatient Encounter Prescriptions as of 12/06/2015  Medication Sig Note  . acetaminophen (TYLENOL ARTHRITIS PAIN) 650 MG CR tablet Take 650 mg by mouth daily.   . fluticasone (FLONASE) 50 MCG/ACT nasal spray USE 2 SPRAYS IN EACH NOSTRIL ONCE DAILY (Patient taking differently: Use 1 spray each nostril once daily)   . HYDROcodone-acetaminophen (NORCO/VICODIN) 5-325 MG tablet Take 1 tablet by mouth every 6 (six) hours as needed.  12/06/2015: Using for shoulder pain, 4x daily  . Multiple Vitamin (MULTIVITAMIN WITH MINERALS) TABS Take 1 tablet by mouth every morning.     Marland Kitchen PARoxetine (PAXIL) 40 MG tablet TAKE 1 TABLET EVERY MORNING   . albuterol (PROVENTIL HFA;VENTOLIN HFA) 108 (90 BASE) MCG/ACT inhaler Inhale 2 puffs into the lungs every 6 (six) hours as needed. (Patient not taking: Reported on 12/06/2015) 12/06/2015: Hasn't used in 3 months or longer, just prn  . ALPRAZolam (XANAX) 0.5 MG tablet TAKE 1/2 TO 1 TABLET BY MOUTH THREE TIMES DAILY AS NEEDED FOR ANXIETY OR SLEEP. (Patient not taking: Reported on 12/06/2015) 12/06/2015: Use qHS recently, due to shoulder pain.  Normal way of taking is just prn anxiety.  . clindamycin (CLEOCIN) 300 MG capsule Take 600 mg by mouth daily as needed (1-2 hours prior to dental work). Reported on 12/06/2015   . diphenhydramine-acetaminophen (TYLENOL PM) 25-500 MG TABS Take 1 tablet by mouth at bedtime as needed (insomnia or pain). Reported on 12/06/2015 12/06/2015: Not taking currently, due to taking hydrocodone and xanax at bedtime  . docusate sodium (COLACE) 100 MG capsule Take 1 capsule (100 mg total) by mouth 2 (two) times daily. (Patient not taking: Reported on 12/06/2015) 12/06/2015: Uses prn--only needed when having surgery related to kidney stones  . FLOVENT HFA 110 MCG/ACT inhaler INHALE 1 PUFF INTO THE LUNGS 2 (TWO) TIMES DAILY. (Patient not taking: Reported on 12/06/2015) 12/06/2015: He stopped using it a little over a week ago (hand arthritis flaring, didn't feel like using)  . levocetirizine (XYZAL) 5 MG tablet TAKE 1 TABLET (5 MG TOTAL) BY MOUTH DAILY WITH BREAKFAST. (Patient not taking: Reported on 12/06/2015) 12/06/2015: Uses in the spring  .  oxybutynin (DITROPAN) 5 MG tablet Take 1 tablet (5 mg total) by mouth 3 (three) times daily as needed for bladder spasms. (Patient not taking: Reported on 12/06/2015) 12/06/2015: Only took Aug/Sept during problems with kidney stones  . tamsulosin (FLOMAX) 0.4 MG CAPS capsule Take 1 capsule (0.4 mg total) by mouth at bedtime. (Patient not taking: Reported on 12/06/2015)  12/06/2015: Only took with kidney stones  . [DISCONTINUED] HYDROmorphone (DILAUDID) 2 MG tablet Take 1 tablet (2 mg total) by mouth every 4 (four) hours as needed for moderate pain or severe pain. (Patient not taking: Reported on 12/06/2015)   . [DISCONTINUED] phenazopyridine (PYRIDIUM) 200 MG tablet Take 1 tablet (200 mg total) by mouth 3 (three) times daily as needed for pain. (Patient taking differently: Take 100 mg by mouth 3 (three) times daily as needed (urination pain). )    No facility-administered encounter medications on file as of 12/06/2015.   No Known Allergies  ROS: denies fevers, chills, URI or allergy symptoms, wheezing, shortness of breath, cough, chest pain, palpitations, nausea, vomiting, heartburn, bowel changes, diarrhea.  +hand arthritis, right shoulder pain.  Slight pain in left shoulder and back. He has some right hip pain when sitting/driving (has seen Dr. Wynelle Link for this, reportedly has bone spur). Urinary complaints as per HPI  PHYSICAL EXAM: BP 120/74 mmHg  Pulse 64  Ht 6' 2.25" (1.886 m)  Wt 184 lb 12.8 oz (83.825 kg)  BMI 23.57 kg/m2  Well developed, pleasant, talkative male in no distress HEENT: PERRL, EOMI, conjunctiva and sclera clear. OP is clear Neck: no lymphadenopathy, thyromegaly or bruit Heart: regular rate and rhythm without murmur Lungs: clear bilaterally, no wheezes, rales, ronchi Chest: nontender, no mass Abdomen: soft, nontender, no organomegaly or mass Extremities: no edema, normal pulses.  Painful ROM of RUE. Skin: no rashes or lesions noted Psych: normal mood, affect, hygiene and grooming Neuro: alert and oriented. Normal gait, sensation, cranial nerves  Urine dip: normal Spirometry: Mild airway obstruction.  ASSESSMENT/PLAN:  Right shoulder pain - in need of arthroscopic surgery  Urinary urgency - reassured normal urine dip; f/u with Dr. Jeffie Pollock if ongoing symptoms - Plan: POCT Urinalysis Dipstick  Asthma, mild intermittent,  uncomplicated - restart Flovent daily. Flu shot encouraged, refuses - Plan: Spirometry with graph  Preoperative clearance   Reassured no evidence of UTI or recurrent stone per urine. It appears he previously was on oxybutynin and flomax--not sure if he might benefit from either of those being restarted. If symptoms persist, he should f/u with Dr. Jeffie Pollock.   FFO and will be faxed back to Dr. Maxie Better  F/u as scheduled for CPE in February

## 2015-12-06 NOTE — Patient Instructions (Addendum)
There is no evidence of UTI or recurrent stone per urine. It appears you previously took oxybutynin and flomax (medications for the bladder and prostate, also for kidney stones)--not sure if you might benefit from either of those being restarted. If urinary symptoms persist, please follow up with Dr. Jeffie Pollock.  Be sure NOT to use the equate acetaminophen along with the hydrocodone which also contains acetaminophen--you don't want to hurt your liver by taking too much tylenol product (acetaminophen).  Restart using the Flovent.  I will be faxing the form back to your orthopedist so your surgery can be scheduled.  Follow up as scheduled in February for your Physical.  Consider trying supplements with glucosamine and chondroitin (ie osteo biflex) as a daily vitamin/supplement that can help with arthritis.

## 2015-12-10 ENCOUNTER — Ambulatory Visit: Payer: Self-pay | Admitting: Orthopedic Surgery

## 2015-12-12 ENCOUNTER — Encounter (HOSPITAL_COMMUNITY): Payer: Self-pay

## 2015-12-12 ENCOUNTER — Encounter (HOSPITAL_COMMUNITY)
Admission: RE | Admit: 2015-12-12 | Discharge: 2015-12-12 | Disposition: A | Discharge status: Home / Self Care | Payer: Medicare Other | Source: Ambulatory Visit | Attending: Specialist | Admitting: Specialist

## 2015-12-12 DIAGNOSIS — M7501 Adhesive capsulitis of right shoulder: Secondary | ICD-10-CM | POA: Diagnosis not present

## 2015-12-12 DIAGNOSIS — Z01812 Encounter for preprocedural laboratory examination: Secondary | ICD-10-CM | POA: Diagnosis not present

## 2015-12-12 DIAGNOSIS — Z01818 Encounter for other preprocedural examination: Secondary | ICD-10-CM | POA: Insufficient documentation

## 2015-12-12 DIAGNOSIS — M7541 Impingement syndrome of right shoulder: Secondary | ICD-10-CM | POA: Insufficient documentation

## 2015-12-12 HISTORY — DX: Personal history of urinary (tract) infections: Z87.440

## 2015-12-12 HISTORY — DX: Urgency of urination: R39.15

## 2015-12-12 LAB — COMPREHENSIVE METABOLIC PANEL
ALK PHOS: 64 U/L (ref 38–126)
ALT: 19 U/L (ref 17–63)
ANION GAP: 9 (ref 5–15)
AST: 22 U/L (ref 15–41)
Albumin: 3.9 g/dL (ref 3.5–5.0)
BILIRUBIN TOTAL: 0.6 mg/dL (ref 0.3–1.2)
BUN: 12 mg/dL (ref 6–20)
CALCIUM: 9.4 mg/dL (ref 8.9–10.3)
CO2: 30 mmol/L (ref 22–32)
CREATININE: 0.85 mg/dL (ref 0.61–1.24)
Chloride: 103 mmol/L (ref 101–111)
GFR calc non Af Amer: >60 mL/min (ref >60)
GLUCOSE: 101 mg/dL — AB (ref 65–99)
Potassium: 4.6 mmol/L (ref 3.5–5.1)
Sodium: 142 mmol/L (ref 135–145)
TOTAL PROTEIN: 6.8 g/dL (ref 6.5–8.1)

## 2015-12-12 LAB — CBC
HEMATOCRIT: 34.8 % — AB (ref 39.0–52.0)
HEMOGLOBIN: 11.6 g/dL — AB (ref 13.0–17.0)
MCH: 32.4 pg (ref 26.0–34.0)
MCHC: 33.3 g/dL (ref 30.0–36.0)
MCV: 97.2 fL (ref 78.0–100.0)
Platelets: 254 10*3/uL (ref 150–400)
RBC: 3.58 MIL/uL — ABNORMAL LOW (ref 4.22–5.81)
RDW: 12.3 % (ref 11.5–15.5)
WBC: 5.2 10*3/uL (ref 4.0–10.5)

## 2015-12-12 NOTE — Patient Instructions (Signed)
Floyed Torgerson.  12/12/2015   Your procedure is scheduled on: Thursday December 20, 2015  Report to [facility]  Entrance take [place]  elevators to 3rd floor to  [place] at 9:00 AM.  Call this number if you have problems the morning of surgery [phone]   Remember: ONLY 1 PERSON MAY GO WITH YOU TO SHORT STAY TO GET  READY MORNING OF [place].  Do not eat food or drink liquids :After Midnight.     Take these medicines the morning of surgery with A SIP OF WATER: Alprazolam (Xanax) if needed; May use albuterol inhaler if needed (bring with you day of surgery); Flovent Inhaler  (bring with you day of surgery); Flonase nasal spray if needed (bring with you day of surgery; Hydrocodone-Acetaminophen if needed; Paroxetine (Paxil)                                You may not have any metal on your body including hair pins and              piercings  Do not wear jewelry,  lotions, powders or colonges, deodorant                           Men may shave face and neck.   Do not bring valuables to the hospital. [place].  Contacts, dentures or bridgework may not be worn into surgery.  Leave suitcase in the car. After surgery it may be brought to your room.                Please read over the following fact sheets you were given:INCENTIVE SPIROMETER  _____________________________________________________________________             [facility] - Preparing for Surgery Before surgery, you can play an important role.  Because skin is not sterile, your skin needs to be as free of germs as possible.  You can reduce the number of germs on your skin by washing with CHG (chlorahexidine gluconate) soap before surgery.  CHG is an antiseptic cleaner which kills germs and bonds with the skin to continue killing germs even after washing. Please DO NOT use if you have an allergy to CHG or antibacterial soaps.  If your  skin becomes reddened/irritated stop using the CHG and inform your nurse when you arrive at Short Stay. Do not shave (including legs and underarms) for at least 48 hours prior to the first CHG shower.  You may shave your face/neck. Please follow these instructions carefully:  1.  Shower with CHG Soap the night before surgery and the  morning of Surgery.  2.  If you choose to wash your hair, wash your hair first as usual with your  normal  shampoo.  3.  After you shampoo, rinse your hair and body thoroughly to remove the  shampoo.                           4.  Use CHG as you would any other liquid soap.  You can apply chg directly  to the skin and wash  Gently with a scrungie or clean washcloth.  5.  Apply the CHG Soap to your body ONLY FROM THE NECK DOWN.   Do not use on face/ open                           Wound or open sores. Avoid contact with eyes, ears mouth and genitals (private parts).                       Wash face,  Genitals (private parts) with your normal soap.             6.  Wash thoroughly, paying special attention to the area where your surgery  will be performed.  7.  Thoroughly rinse your body with warm water from the neck down.  8.  DO NOT shower/wash with your normal soap after using and rinsing off  the CHG Soap.                9.  Pat yourself dry with a clean towel.            10.  Wear clean pajamas.            11.  Place clean sheets on your bed the night of your first shower and do not  sleep with pets. Day of Surgery : Do not apply any lotions/deodorants the morning of surgery.  Please wear clean clothes to the hospital/surgery center.  FAILURE TO FOLLOW THESE INSTRUCTIONS MAY RESULT IN THE CANCELLATION OF YOUR SURGERY PATIENT SIGNATURE_________________________________  NURSE SIGNATURE__________________________________  ________________________________________________________________________   Adam Phenix  An incentive spirometer  is a tool that can help keep your lungs clear and active. This tool measures how well you are filling your lungs with each breath. Taking long deep breaths may help reverse or decrease the chance of developing breathing (pulmonary) problems (especially infection) following:  A long period of time when you are unable to move or be active. BEFORE THE PROCEDURE   If the spirometer includes an indicator to show your best effort, your nurse or respiratory therapist will set it to a desired goal.  If possible, sit up straight or lean slightly forward. Try not to slouch.  Hold the incentive spirometer in an upright position. INSTRUCTIONS FOR USE  1. Sit on the edge of your bed if possible, or sit up as far as you can in bed or on a chair. 2. Hold the incentive spirometer in an upright position. 3. Breathe out normally. 4. Place the mouthpiece in your mouth and seal your lips tightly around it. 5. Breathe in slowly and as deeply as possible, raising the piston or the ball toward the top of the column. 6. Hold your breath for 3-5 seconds or for as long as possible. Allow the piston or ball to fall to the bottom of the column. 7. Remove the mouthpiece from your mouth and breathe out normally. 8. Rest for a few seconds and repeat Steps 1 through 7 at least 10 times every 1-2 hours when you are awake. Take your time and take a few normal breaths between deep breaths. 9. The spirometer may include an indicator to show your best effort. Use the indicator as a goal to work toward during each repetition. 10. After each set of 10 deep breaths, practice coughing to be sure your lungs are clear. If you have an incision (the cut made at the time of surgery),  support your incision when coughing by placing a pillow or rolled up towels firmly against it. Once you are able to get out of bed, walk around indoors and cough well. You may stop using the incentive spirometer when instructed by your caregiver.  RISKS AND  COMPLICATIONS  Take your time so you do not get dizzy or light-headed.  If you are in pain, you may need to take or ask for pain medication before doing incentive spirometry. It is harder to take a deep breath if you are having pain. AFTER USE  Rest and breathe slowly and easily.  It can be helpful to keep track of a log of your progress. Your caregiver can provide you with a simple table to help with this. If you are using the spirometer at home, follow these instructions: [place] IF:   You are having difficultly using the spirometer.  You have trouble using the spirometer as often as instructed.  Your pain medication is not giving enough relief while using the spirometer.  You develop fever of 100.5 F (38.1 C) or higher. SEEK IMMEDIATE MEDICAL CARE IF:   You cough up bloody sputum that had not been present before.  You develop fever of 102 F (38.9 C) or greater.  You develop worsening pain at or near the incision site. MAKE SURE YOU:   Understand these instructions.  Will watch your condition.  Will get help right away if you are not doing well or get worse. Document Released: 04/20/2007 Document Revised: 03/01/2012 Document Reviewed: 06/21/2007 [facility] Patient Information 2014 [place], [place].   ________________________________________________________________________

## 2015-12-14 ENCOUNTER — Telehealth: Payer: Self-pay | Admitting: Family Medicine

## 2015-12-14 MED ORDER — ALPRAZOLAM 0.5 MG PO TABS: ORAL_TABLET | ORAL | Status: DC

## 2015-12-14 NOTE — Telephone Encounter (Signed)
Refill phoned in

## 2015-12-14 NOTE — Telephone Encounter (Signed)
Wants refill on generic xanax States he has had to take more recently since his surgery and he is having to take one at night to help him relax so he can sleep He does have 2 pills left but knew office would be closed for [place]

## 2015-12-19 ENCOUNTER — Ambulatory Visit: Payer: Self-pay | Admitting: Orthopedic Surgery

## 2015-12-19 NOTE — H&P (Signed)
Larry Rebeck. is an 69 y.o. male.   Chief Complaint: R shoulder pain HPI: The patient is a 69 year old male who presents today for follow up of their shoulder. The patient is being followed for their bilateral shoulder pain and neck pain. They are 8 week(s) out from when symptoms began. Symptoms reported today include: pain. Current treatment includes: relative rest, activity modification, NSAIDs and pain medications. The following medication has been used for pain control: Norco and ibuprofen. The patient presents today following MRI.  [name] follows up having persistent pain in the shoulder. It has been about eight weeks. He had temporary relief from the injection in the subacromial space, has had somewhat similar to this in the past. We did a shoulder arthroscopy on the left. He is reporting overall pain is asking for a surgical intervention. This all started when he was carrying a lot of book blocks and lifting repetitive use of the arm. No specific injury. He does have some neck pain, but feels this more relegated to the shoulder, seeing Dr. Wynelle Link for hip pain.    Past Medical History  Diagnosis Date  . Panic disorder 2002  . Pure hypercholesterolemia   . DDD (degenerative disc disease), lumbar   . Anxiety   . History of alcohol abuse     QUIT 1993  . Mild asthma   . History of kidney stones   . Right ureteral stone   . DJD (degenerative joint disease)   . History of diverticulosis   . Renal calculus     RIGHT LOWER POLE  . Chronic back pain   . PONV (postoperative nausea and vomiting)   . Renal stone 08/04/2013  . Colon polyps 05/2015    tubular adenoma; diverticulosis; poor prep--repeat 1 year (Dr. Wynetta Emery)  . History of urinary tract infection   . Urinary urgency     Past Surgical History  Procedure Laterality Date  . Appendectomy  age 79  . Tonsillectomy and adenoidectomy  age 18  . Left heel spur surgery  1989  . Nasal polyp surgery  1993,repeat 2006  .  Elbow surgery Right 1995  &   1996  . Laparoscopic cholecystectomy  1998  . Total hip arthroplasty Left 04/26/2010    Dr. Wynelle Link  . Total hip revision  12/01/2011    Procedure: TOTAL HIP REVISION;  Surgeon: Dione Plover Aluisio;  Location: [facility];  Service: Orthopedics;  Laterality: Left;  . Excision/release bursa hip  10/26/2012    Procedure: EXCISION/RELEASE BURSA HIP;  Surgeon: Gearlean Alf, MD;  Location: [facility];  Service: Orthopedics;  Laterality: Left;  Left Hip Bursectomy with Tendon Repair  . Tendon repair  10/26/2012    Procedure: TENDON REPAIR;  Surgeon: Gearlean Alf, MD;  Location: [facility];  Service: Orthopedics;  Laterality: Left;  . Knee arthroscopy w/ meniscectomy  [place]  . Cervical fusion  1993  . Left ureteroscopic stone extraction  04-17-2009  . Right ureteroscopic holium lasertripsy/ stent placement  05-15-2011  . Shoulder arthroscopy with rotator cuff repair and subacromial decompression Left 10-05-2008    AND DEBRIDEMENT LABRAL  . Closed left shoulder manipulation/ excision lipoma  02-15-2009  . Cystoscopy with retrograde pyelogram, ureteroscopy and stent placement Right 08/04/2013    Procedure: [place];  Surgeon: Malka So, MD;  Location: [facility];  Service: Urology;  Laterality: Right;  URETEROSCOPY WITH LASER APPLICATION  . Joint replacement    .  Colonoscopy with propofol N/A 06/19/2015    Procedure: COLONOSCOPY WITH PROPOFOL;  Surgeon: Garlan Fair, MD;  Location: [facility];  Service: Endoscopy;  Laterality: N/A;  . Cystoscopy with retrograde pyelogram, ureteroscopy and stent placement Right 08/14/2015    Procedure: CYSTOSCOPY WITH RETROGRADE PYELOGRAM, DIGITAL URETEROSCOPY AND STENT PLACEMENT;  Surgeon: Alexis Frock, MD;  Location: [facility];  Service: Urology;  Laterality: Right;  . Holmium laser application Right [id number]    Procedure: HOLMIUM LASER APPLICATION;  Surgeon: Alexis Frock, MD;  Location: [facility];  Service:  Urology;  Laterality: Right;  . Cystoscopy w/ ureteral stent placement Left 08/14/2015    Procedure: CYSTOSCOPY WITH RETROGRADE PYELOGRAM/URETERAL STENT PLACEMENT;  Surgeon: Alexis Frock, MD;  Location: [facility];  Service: Urology;  Laterality: Left;  . Nephrolithotomy Left 09/04/2015    Procedure: 1ST STAGE LEFT PERCUTANEOUS NEPHROLITHOTOMY ;  Surgeon: Irine Seal, MD;  Location: [facility];  Service: Urology;  Laterality: Left;  . Cystoscopy w/ ureteral stent removal Bilateral 09/04/2015    Procedure: CYSTOSCOPY WITH BILATERAL STENT REMOVAL;  Surgeon: Irine Seal, MD;  Location: [facility];  Service: Urology;  Laterality: Bilateral;  . Holmium laser application Left [id number]    Procedure: HOLMIUM LASER APPLICATION;  Surgeon: Irine Seal, MD;  Location: [facility];  Service: Urology;  Laterality: Left;    Family History  Problem Relation Age of Onset  . Hypertension Mother   . Anxiety disorder Mother   . Cancer Mother     male cancer  . Cancer Father     colon  . Colon cancer Father   . Panic disorder Daughter   . Diverticulosis Daughter   . Cancer Maternal Grandmother     breast and stomach  . Heart disease Maternal Grandmother   . Cancer Paternal Grandmother 62    colon cancer  . Colon cancer Paternal Grandmother   . Alcohol abuse Brother   . Diabetes Neg Hx    Social History:  reports that he has never smoked. He has never used smokeless tobacco. He reports that he does not drink alcohol or use illicit drugs.  Allergies: No Known Allergies   (Not in a hospital admission)  No results found for this or any previous visit (from the past 48 hour(s)). No results found.  Review of Systems  Constitutional: Negative.   Eyes: Negative.   Respiratory: Negative.   Cardiovascular: Negative.   Gastrointestinal: Negative.   Genitourinary: Negative.   Musculoskeletal: Positive for back pain, joint pain and neck pain.  Skin: Negative.     There were no vitals taken for this visit. Physical  Exam  Constitutional: He is oriented to person, place, and time. He appears well-developed and well-nourished.  HENT:  Head: Normocephalic.  Eyes: Pupils are equal, round, and reactive to light.  Neck: Normal range of motion.  Cardiovascular: Normal rate.   Respiratory: Effort normal.  GI: Soft.  Musculoskeletal:  On exam, he has a positive impingement sign and positive second impingement sign of the shoulder, and decreased internal rotation, abduction, and external rotation. He is not tender over the [facility]. Negative crossover maneuver. Elbow exam is unremarkable. Wrist exam is unremarkable. He has some arthralgias in the MCP joints of the hand. There are no trophic changes.   Neurological: He is alert and oriented to person, place, and time. He has normal reflexes.    MRI of the shoulder demonstrates moderate-to-severe rotator cuff arthropathy, mild glenohumeral arthrosis and labral tearing, AC arthrosis and subcortical cyst in the greater tuberosity,  and mild adhesive capsulitis.  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 He has had an ACDF at C3 to C5 and C6-7. No cord edema.  Assessment/Plan 1. Rotator cuff arthropathy, adhesive capsulitis, intrasubstance tear and labral tear, and mild AC arthrosis. 2. Axial cervical pain and history of cervical fusion with neural foraminal stenosis. No focal neurologic deficit.  I had an extensive discussion concerning his current pathology, relevant anatomy, and treatment options. At this point in time, we discussed activity modification, repeat injections, and continued conservative treatment. He has been doing a therapy on his own at home. He does not want to proceed with a formal supervised physical therapy. He has asked if there are any surgical options for him, temporary relief from the injection. We discussed manipulation and arthroscopic debridement like he has had in the past. He would like to proceed with  that.  I had a long discussion with the patient concerning risks and benefits of shoulder arthroscopy including no changes, worsening in symptoms. Also the need for manipulation of the extremity, need for open rotator cuff repair. Also discussed infection, DVT, PE, anesthetic complications, etc. Also included was the possibility of requirement for a repeat debridement in the future. Perioperative course was discussed in detail as well as time to recovery. An illustrated handout was provided and discussed in detail.  In the interim, we discussed activity modification in terms of his neck to proceed accordingly. Discussed conservative treatment for his cervical spine. Preoperative clearance, outpatient arthroscopy and manipulation. It is reasonable we will proceed with that.  Plan right shoulder EUA/MUA, arthroscopy, SAD, debridement  Siah Steely M. PA-C for Dr. Tonita Cong 12/19/2015, 1:56 PM

## 2015-12-20 ENCOUNTER — Encounter (HOSPITAL_COMMUNITY): Payer: Self-pay | Admitting: *Deleted

## 2015-12-20 ENCOUNTER — Encounter (HOSPITAL_COMMUNITY)
Admission: RE | Disposition: A | Discharge status: Home / Self Care | Payer: Self-pay | Source: Ambulatory Visit | Attending: Specialist

## 2015-12-20 ENCOUNTER — Ambulatory Visit (HOSPITAL_COMMUNITY): Payer: Medicare Other | Admitting: Anesthesiology

## 2015-12-20 ENCOUNTER — Ambulatory Visit (HOSPITAL_COMMUNITY)
Admission: RE | Admit: 2015-12-20 | Discharge: 2015-12-21 | Disposition: A | Discharge status: Home / Self Care | Payer: Medicare Other | Source: Ambulatory Visit | Attending: Specialist | Admitting: Specialist

## 2015-12-20 DIAGNOSIS — F419 Anxiety disorder, unspecified: Secondary | ICD-10-CM | POA: Diagnosis not present

## 2015-12-20 DIAGNOSIS — R3915 Urgency of urination: Secondary | ICD-10-CM | POA: Diagnosis not present

## 2015-12-20 DIAGNOSIS — M7541 Impingement syndrome of right shoulder: Secondary | ICD-10-CM | POA: Diagnosis not present

## 2015-12-20 DIAGNOSIS — M7581 Other shoulder lesions, right shoulder: Secondary | ICD-10-CM | POA: Diagnosis not present

## 2015-12-20 DIAGNOSIS — J4520 Mild intermittent asthma, uncomplicated: Secondary | ICD-10-CM | POA: Insufficient documentation

## 2015-12-20 DIAGNOSIS — M7501 Adhesive capsulitis of right shoulder: Secondary | ICD-10-CM | POA: Diagnosis not present

## 2015-12-20 DIAGNOSIS — M19011 Primary osteoarthritis, right shoulder: Secondary | ICD-10-CM | POA: Diagnosis not present

## 2015-12-20 HISTORY — PX: SHOULDER ARTHROSCOPY WITH SUBACROMIAL DECOMPRESSION: SHX5684

## 2015-12-20 HISTORY — PX: EXAM UNDER ANESTHESIA WITH MANIPULATION OF SHOULDER: SHX5817

## 2015-12-20 SURGERY — SHOULDER ARTHROSCOPY WITH SUBACROMIAL DECOMPRESSION
Anesthesia: General | Site: Shoulder | Laterality: Right

## 2015-12-20 MED ORDER — METOCLOPRAMIDE HCL 10 MG PO TABS: 5.0000 mg | ORAL_TABLET | Freq: Three times a day (TID) | ORAL | Status: DC | PRN

## 2015-12-20 MED ORDER — PHENOL 1.4 % MT LIQD: 1.0000 | OROMUCOSAL | Status: DC | PRN

## 2015-12-20 MED ORDER — CEFAZOLIN SODIUM-DEXTROSE 2-3 GM-% IV SOLR
2.0000 g | INTRAVENOUS | Status: AC
  Administered 2015-12-20: 2 g via INTRAVENOUS

## 2015-12-20 MED ORDER — BUPIVACAINE-EPINEPHRINE 0.5% -1:200000 IJ SOLN
INTRAMUSCULAR | Status: DC | PRN
  Administered 2015-12-20: 10 mL

## 2015-12-20 MED ORDER — KETOROLAC TROMETHAMINE 15 MG/ML IJ SOLN
15.0000 mg | Freq: Four times a day (QID) | INTRAMUSCULAR | Status: DC
  Administered 2015-12-20 – 2015-12-21 (×4): 15 mg via INTRAVENOUS
  Filled 2015-12-20 (×8): qty 1

## 2015-12-20 MED ORDER — BUPIVACAINE-EPINEPHRINE (PF) 0.5% -1:200000 IJ SOLN
INTRAMUSCULAR | Status: DC | PRN
  Administered 2015-12-20: 30 mL via PERINEURAL

## 2015-12-20 MED ORDER — LEVOCETIRIZINE DIHYDROCHLORIDE 5 MG PO TABS: 5.0000 mg | ORAL_TABLET | Freq: Every day | ORAL | Status: DC | PRN

## 2015-12-20 MED ORDER — PHENYLEPHRINE HCL 10 MG/ML IJ SOLN
INTRAMUSCULAR | Status: AC
  Filled 2015-12-20: qty 1

## 2015-12-20 MED ORDER — RISAQUAD PO CAPS
1.0000 | ORAL_CAPSULE | Freq: Every day | ORAL | Status: DC
  Administered 2015-12-20 – 2015-12-21 (×2): 1 via ORAL
  Filled 2015-12-20 (×2): qty 1

## 2015-12-20 MED ORDER — SENNOSIDES-DOCUSATE SODIUM 8.6-50 MG PO TABS: 1.0000 | ORAL_TABLET | Freq: Every evening | ORAL | Status: DC | PRN

## 2015-12-20 MED ORDER — KCL IN DEXTROSE-NACL 20-5-0.45 MEQ/L-%-% IV SOLN
INTRAVENOUS | Status: AC
  Filled 2015-12-20: qty 1000

## 2015-12-20 MED ORDER — LIDOCAINE HCL (CARDIAC) 20 MG/ML IV SOLN
INTRAVENOUS | Status: DC | PRN
  Administered 2015-12-20: 100 mg via INTRAVENOUS

## 2015-12-20 MED ORDER — PROPOFOL 10 MG/ML IV BOLUS
INTRAVENOUS | Status: DC | PRN
  Administered 2015-12-20: 200 mg via INTRAVENOUS

## 2015-12-20 MED ORDER — EPINEPHRINE HCL 1 MG/ML IJ SOLN
INTRAMUSCULAR | Status: AC
  Filled 2015-12-20: qty 2

## 2015-12-20 MED ORDER — PAROXETINE HCL 20 MG PO TABS
40.0000 mg | ORAL_TABLET | Freq: Every day | ORAL | Status: DC
  Administered 2015-12-21: 40 mg via ORAL
  Filled 2015-12-20: qty 2

## 2015-12-20 MED ORDER — ONDANSETRON HCL 4 MG/2ML IJ SOLN
INTRAMUSCULAR | Status: DC | PRN
  Administered 2015-12-20: 4 mg via INTRAVENOUS

## 2015-12-20 MED ORDER — DEXAMETHASONE SODIUM PHOSPHATE 10 MG/ML IJ SOLN
INTRAMUSCULAR | Status: AC
  Filled 2015-12-20: qty 1

## 2015-12-20 MED ORDER — ENOXAPARIN SODIUM 30 MG/0.3ML ~~LOC~~ SOLN
30.0000 mg | SUBCUTANEOUS | Status: DC
  Administered 2015-12-21: 30 mg via SUBCUTANEOUS
  Filled 2015-12-20 (×2): qty 0.3

## 2015-12-20 MED ORDER — FLUTICASONE PROPIONATE 50 MCG/ACT NA SUSP
2.0000 | Freq: Every day | NASAL | Status: DC
  Filled 2015-12-20: qty 16

## 2015-12-20 MED ORDER — OXYCODONE HCL 5 MG PO TABS
5.0000 mg | ORAL_TABLET | ORAL | Status: DC | PRN
  Administered 2015-12-21: 5 mg via ORAL
  Administered 2015-12-21: 10 mg via ORAL
  Filled 2015-12-20: qty 1
  Filled 2015-12-20: qty 2

## 2015-12-20 MED ORDER — BUPIVACAINE-EPINEPHRINE (PF) 0.25% -1:200000 IJ SOLN
INTRAMUSCULAR | Status: AC
  Filled 2015-12-20: qty 30

## 2015-12-20 MED ORDER — ADULT MULTIVITAMIN W/MINERALS CH
1.0000 | ORAL_TABLET | Freq: Every morning | ORAL | Status: DC
  Administered 2015-12-20 – 2015-12-21 (×2): 1 via ORAL
  Filled 2015-12-20 (×2): qty 1

## 2015-12-20 MED ORDER — FENTANYL CITRATE (PF) 100 MCG/2ML IJ SOLN
INTRAMUSCULAR | Status: AC
  Filled 2015-12-20: qty 2

## 2015-12-20 MED ORDER — HYDROMORPHONE HCL 1 MG/ML IJ SOLN: 0.2500 mg | INTRAMUSCULAR | Status: DC | PRN

## 2015-12-20 MED ORDER — EPINEPHRINE HCL 1 MG/ML IJ SOLN
INTRAMUSCULAR | Status: DC | PRN
  Administered 2015-12-20: 2 mg

## 2015-12-20 MED ORDER — ALBUTEROL SULFATE (2.5 MG/3ML) 0.083% IN NEBU: 2.5000 mg | INHALATION_SOLUTION | Freq: Four times a day (QID) | RESPIRATORY_TRACT | Status: DC | PRN

## 2015-12-20 MED ORDER — ROCURONIUM BROMIDE 100 MG/10ML IV SOLN
INTRAVENOUS | Status: AC
  Filled 2015-12-20: qty 1

## 2015-12-20 MED ORDER — EPHEDRINE SULFATE 50 MG/ML IJ SOLN
INTRAMUSCULAR | Status: AC
  Filled 2015-12-20: qty 1

## 2015-12-20 MED ORDER — MAGNESIUM CITRATE PO SOLN: 1.0000 | Freq: Once | ORAL | Status: DC | PRN

## 2015-12-20 MED ORDER — CEFAZOLIN SODIUM-DEXTROSE 2-3 GM-% IV SOLR
2.0000 g | Freq: Four times a day (QID) | INTRAVENOUS | Status: AC
  Administered 2015-12-20: 2 g via INTRAVENOUS
  Filled 2015-12-20: qty 50

## 2015-12-20 MED ORDER — HYDROMORPHONE HCL 1 MG/ML IJ SOLN: 1.0000 mg | INTRAMUSCULAR | Status: DC | PRN

## 2015-12-20 MED ORDER — PROPOFOL 10 MG/ML IV BOLUS
INTRAVENOUS | Status: AC
  Filled 2015-12-20: qty 20

## 2015-12-20 MED ORDER — ONDANSETRON HCL 4 MG/2ML IJ SOLN: 4.0000 mg | Freq: Four times a day (QID) | INTRAMUSCULAR | Status: DC | PRN

## 2015-12-20 MED ORDER — ALPRAZOLAM 0.5 MG PO TABS
0.5000 mg | ORAL_TABLET | Freq: Three times a day (TID) | ORAL | Status: DC | PRN
  Administered 2015-12-20 – 2015-12-21 (×2): 0.5 mg via ORAL
  Filled 2015-12-20 (×2): qty 1

## 2015-12-20 MED ORDER — LACTATED RINGERS IV SOLN
INTRAVENOUS | Status: DC
  Administered 2015-12-20: 1000 mL via INTRAVENOUS

## 2015-12-20 MED ORDER — CEFAZOLIN SODIUM-DEXTROSE 2-3 GM-% IV SOLR
INTRAVENOUS | Status: AC
  Filled 2015-12-20: qty 50

## 2015-12-20 MED ORDER — DEXAMETHASONE SODIUM PHOSPHATE 10 MG/ML IJ SOLN
INTRAMUSCULAR | Status: DC | PRN
  Administered 2015-12-20: 10 mg via INTRAVENOUS

## 2015-12-20 MED ORDER — LORATADINE 10 MG PO TABS: 10.0000 mg | ORAL_TABLET | Freq: Every day | ORAL | Status: DC | PRN

## 2015-12-20 MED ORDER — SCOPOLAMINE 1 MG/3DAYS TD PT72
1.0000 | MEDICATED_PATCH | TRANSDERMAL | Status: DC
  Administered 2015-12-20: 1.5 mg via TRANSDERMAL
  Filled 2015-12-20: qty 1

## 2015-12-20 MED ORDER — ACETAMINOPHEN 325 MG PO TABS: 650.0000 mg | ORAL_TABLET | Freq: Four times a day (QID) | ORAL | Status: DC | PRN

## 2015-12-20 MED ORDER — BUDESONIDE 0.25 MG/2ML IN SUSP
0.2500 mg | Freq: Two times a day (BID) | RESPIRATORY_TRACT | Status: DC
  Administered 2015-12-20: 0.25 mg via RESPIRATORY_TRACT
  Filled 2015-12-20: qty 2

## 2015-12-20 MED ORDER — ONDANSETRON HCL 4 MG/2ML IJ SOLN
INTRAMUSCULAR | Status: AC
  Filled 2015-12-20: qty 2

## 2015-12-20 MED ORDER — PHENYLEPHRINE HCL 10 MG/ML IJ SOLN
10.0000 mg | INTRAMUSCULAR | Status: DC | PRN
Start: 2015-12-20 — End: 2015-12-20
  Administered 2015-12-20: 30 ug/min via INTRAVENOUS

## 2015-12-20 MED ORDER — SUCCINYLCHOLINE CHLORIDE 20 MG/ML IJ SOLN
INTRAMUSCULAR | Status: DC | PRN
  Administered 2015-12-20: 100 mg via INTRAVENOUS

## 2015-12-20 MED ORDER — PROMETHAZINE HCL 25 MG/ML IJ SOLN
6.2500 mg | INTRAMUSCULAR | Status: DC | PRN
Start: 2015-12-20 — End: 2015-12-20

## 2015-12-20 MED ORDER — MENTHOL 3 MG MT LOZG: 1.0000 | LOZENGE | OROMUCOSAL | Status: DC | PRN

## 2015-12-20 MED ORDER — METHOCARBAMOL 1000 MG/10ML IJ SOLN
500.0000 mg | Freq: Four times a day (QID) | INTRAVENOUS | Status: DC | PRN
  Filled 2015-12-20: qty 5

## 2015-12-20 MED ORDER — ONDANSETRON HCL 4 MG PO TABS: 4.0000 mg | ORAL_TABLET | Freq: Four times a day (QID) | ORAL | Status: DC | PRN

## 2015-12-20 MED ORDER — DOCUSATE SODIUM 100 MG PO CAPS
100.0000 mg | ORAL_CAPSULE | Freq: Two times a day (BID) | ORAL | Status: DC
  Administered 2015-12-21 (×2): 100 mg via ORAL

## 2015-12-20 MED ORDER — BUPIVACAINE-EPINEPHRINE (PF) 0.5% -1:200000 IJ SOLN
INTRAMUSCULAR | Status: AC
  Filled 2015-12-20: qty 30

## 2015-12-20 MED ORDER — KCL IN DEXTROSE-NACL 20-5-0.45 MEQ/L-%-% IV SOLN
INTRAVENOUS | Status: DC
  Administered 2015-12-20: 14:00:00 via INTRAVENOUS
  Filled 2015-12-20 (×2): qty 1000

## 2015-12-20 MED ORDER — BISACODYL 5 MG PO TBEC: 5.0000 mg | DELAYED_RELEASE_TABLET | Freq: Every day | ORAL | Status: DC | PRN

## 2015-12-20 MED ORDER — LIDOCAINE HCL (CARDIAC) 20 MG/ML IV SOLN
INTRAVENOUS | Status: AC
  Filled 2015-12-20: qty 5

## 2015-12-20 MED ORDER — METOCLOPRAMIDE HCL 5 MG/ML IJ SOLN: 5.0000 mg | Freq: Three times a day (TID) | INTRAMUSCULAR | Status: DC | PRN

## 2015-12-20 MED ORDER — FENTANYL CITRATE (PF) 100 MCG/2ML IJ SOLN
100.0000 ug | Freq: Once | INTRAMUSCULAR | Status: AC
  Administered 2015-12-20: 100 ug via INTRAVENOUS

## 2015-12-20 MED ORDER — SODIUM CHLORIDE 0.9 % IJ SOLN
INTRAMUSCULAR | Status: AC
  Filled 2015-12-20: qty 10

## 2015-12-20 MED ORDER — EPHEDRINE SULFATE 50 MG/ML IJ SOLN
INTRAMUSCULAR | Status: DC | PRN
  Administered 2015-12-20 (×2): 5 mg via INTRAVENOUS

## 2015-12-20 MED ORDER — METHOCARBAMOL 500 MG PO TABS: 500.0000 mg | ORAL_TABLET | Freq: Four times a day (QID) | ORAL | Status: DC | PRN

## 2015-12-20 MED ORDER — MIDAZOLAM HCL 2 MG/2ML IJ SOLN
INTRAMUSCULAR | Status: AC
Start: 2015-12-20 — End: 2015-12-20
  Filled 2015-12-20: qty 2

## 2015-12-20 MED ORDER — ACETAMINOPHEN 650 MG RE SUPP: 650.0000 mg | Freq: Four times a day (QID) | RECTAL | Status: DC | PRN

## 2015-12-20 MED ORDER — MIDAZOLAM HCL 2 MG/2ML IJ SOLN
2.0000 mg | Freq: Once | INTRAMUSCULAR | Status: AC
  Administered 2015-12-20: 2 mg via INTRAVENOUS

## 2015-12-20 SURGICAL SUPPLY — 39 items
BLADE 4.2CUDA (BLADE) ×3 IMPLANT
BLADE SURG SZ11 CARB STEEL (BLADE) ×3 IMPLANT
BOOTIES KNEE HIGH SLOAN (MISCELLANEOUS) ×6 IMPLANT
BUR OVAL 4.0 (BURR) IMPLANT
CANNULA ACUFO 5X76 (CANNULA) ×3 IMPLANT
CLOTH 2% CHLOROHEXIDINE 3PK (PERSONAL CARE ITEMS) ×3 IMPLANT
DRAPE STERI 35X30 U-POUCH (DRAPES) ×3 IMPLANT
DRAPE U-SHAPE 47X51 STRL (DRAPES) ×3 IMPLANT
DRSG AQUACEL AG ADV 3.5X 4 (GAUZE/BANDAGES/DRESSINGS) IMPLANT
DRSG AQUACEL AG ADV 3.5X 6 (GAUZE/BANDAGES/DRESSINGS) IMPLANT
DRSG MEPILEX BORDER 4X4 (GAUZE/BANDAGES/DRESSINGS) ×6 IMPLANT
DRSG PAD ABDOMINAL 8X10 ST (GAUZE/BANDAGES/DRESSINGS) IMPLANT
DURAPREP 26ML APPLICATOR (WOUND CARE) ×3 IMPLANT
GAUZE SPONGE 4X4 12PLY STRL (GAUZE/BANDAGES/DRESSINGS) IMPLANT
GAUZE XEROFORM 1X8 LF (GAUZE/BANDAGES/DRESSINGS) IMPLANT
GLOVE BIOGEL PI IND STRL 7.0 (GLOVE) ×1 IMPLANT
GLOVE BIOGEL PI IND STRL 8 (GLOVE) IMPLANT
GLOVE BIOGEL PI INDICATOR 7.0 (GLOVE) ×2
GLOVE BIOGEL PI INDICATOR 8 (GLOVE)
GLOVE SURG SS PI 7.0 STRL IVOR (GLOVE) ×3 IMPLANT
GLOVE SURG SS PI 7.5 STRL IVOR (GLOVE) ×3 IMPLANT
GLOVE SURG SS PI 8.0 STRL IVOR (GLOVE) ×3 IMPLANT
GOWN STRL REUS W/TWL XL LVL3 (GOWN DISPOSABLE) ×6 IMPLANT
KIT BASIN OR (CUSTOM PROCEDURE TRAY) ×3 IMPLANT
KIT POSITION SHOULDER SCHLEI (MISCELLANEOUS) ×3 IMPLANT
MANIFOLD NEPTUNE II (INSTRUMENTS) ×3 IMPLANT
NEEDLE SPNL 18GX3.5 QUINCKE PK (NEEDLE) ×3 IMPLANT
PACK SHOULDER (CUSTOM PROCEDURE TRAY) ×3 IMPLANT
POSITIONER SURGICAL ARM (MISCELLANEOUS) ×3 IMPLANT
SET ARTHROSCOPY TUBING (MISCELLANEOUS) ×2
SET ARTHROSCOPY TUBING LN (MISCELLANEOUS) ×1 IMPLANT
SLING ARM FOAM STRAP LRG (SOFTGOODS) IMPLANT
SLING ARM FOAM STRAP MED (SOFTGOODS) IMPLANT
SLING ARM IMMOBILIZER LRG (SOFTGOODS) ×3 IMPLANT
SUT ETHILON 4 0 PS 2 18 (SUTURE) ×3 IMPLANT
TOWEL OR 17X26 10 PK STRL BLUE (TOWEL DISPOSABLE) ×3 IMPLANT
TUBING CONNECTING 10 (TUBING) IMPLANT
TUBING CONNECTING 10' (TUBING)
WAND 90 DEG TURBOVAC W/CORD (SURGICAL WAND) ×3 IMPLANT

## 2015-12-20 NOTE — Interval H&P Note (Signed)
History and Physical Interval Note:  12/20/2015 10:34 AM  [name].  has presented today for surgery, with the diagnosis of IMPINGEMENT SYNDROME, FROZEN RIGHT SHOULDER  The various methods of treatment have been discussed with the patient and family. After consideration of risks, benefits and other options for treatment, the patient has consented to  Procedure(s): RIGHT SHOULDER ARTHROSCOPY WITH SUBACROMIAL DECOMPRESSION WITH DEBRIDEMENT (Right) EXAM UNDER ANESTHESIA WITH MANIPULATION OF RIGHT SHOULDER (Right) as a surgical intervention .  The patient's history has been reviewed, patient examined, no change in status, stable for surgery.  I have reviewed the patient's chart and labs.  Questions were answered to the patient's satisfaction.     Ikenna Ohms C

## 2015-12-20 NOTE — H&P (View-Only) (Signed)
Chief Complaint  Patient presents with  . Medical Clearance    needing medical clearance for right shoulder arthroscopy.    Patient presents requesting form for medical clearance for shoulder surgery, right shoulder arthroscopic surgery with Dr. Maxie Better.  He reports he hasn't exercised since August due to problems with bilateral kidney stones, requiring surgery and stenting.  While fixing his fence in September he developed pain in his right shoulder (and back), for which he has been seeing Dr. Maxie Better. He had MRI, and given lack of improvement and ongoing pain, surgery was recommended.  He is active playing in the yard with his dog, and stretching and doing home exercises. Denies any chest pain, shortness of breath, palpitations or other concerns with exertion.  He has asthma, and admits that he stopped using his Flovent a little over a week ago.  He hasn't needed to use the albuterol since stopping.  He had the urologic procedures in August/September with anesthesia without any complications.  Today he is complaining of some urinary urgency.  No change in appearance (he can't smell) of the urine, no blood.  Denies dysuria. Some urinary frequency. No flank or abdominal pain.  Sometimes the stream is strong, sometimes there is a hesitancy.  He is due to  f/u with Dr. Jeffie Pollock in the spring.  PMH, PSH, SH and FH were updated and reviewed today.  Outpatient Encounter Prescriptions as of 12/06/2015  Medication Sig Note  . acetaminophen (TYLENOL ARTHRITIS PAIN) 650 MG CR tablet Take 650 mg by mouth daily.   . fluticasone (FLONASE) 50 MCG/ACT nasal spray USE 2 SPRAYS IN EACH NOSTRIL ONCE DAILY (Patient taking differently: Use 1 spray each nostril once daily)   . HYDROcodone-acetaminophen (NORCO/VICODIN) 5-325 MG tablet Take 1 tablet by mouth every 6 (six) hours as needed.  12/06/2015: Using for shoulder pain, 4x daily  . Multiple Vitamin (MULTIVITAMIN WITH MINERALS) TABS Take 1 tablet by mouth every morning.     Marland Kitchen PARoxetine (PAXIL) 40 MG tablet TAKE 1 TABLET EVERY MORNING   . albuterol (PROVENTIL HFA;VENTOLIN HFA) 108 (90 BASE) MCG/ACT inhaler Inhale 2 puffs into the lungs every 6 (six) hours as needed. (Patient not taking: Reported on 12/06/2015) 12/06/2015: Hasn't used in 3 months or longer, just prn  . ALPRAZolam (XANAX) 0.5 MG tablet TAKE 1/2 TO 1 TABLET BY MOUTH THREE TIMES DAILY AS NEEDED FOR ANXIETY OR SLEEP. (Patient not taking: Reported on 12/06/2015) 12/06/2015: Use qHS recently, due to shoulder pain.  Normal way of taking is just prn anxiety.  . clindamycin (CLEOCIN) 300 MG capsule Take 600 mg by mouth daily as needed (1-2 hours prior to dental work). Reported on 12/06/2015   . diphenhydramine-acetaminophen (TYLENOL PM) 25-500 MG TABS Take 1 tablet by mouth at bedtime as needed (insomnia or pain). Reported on 12/06/2015 12/06/2015: Not taking currently, due to taking hydrocodone and xanax at bedtime  . docusate sodium (COLACE) 100 MG capsule Take 1 capsule (100 mg total) by mouth 2 (two) times daily. (Patient not taking: Reported on 12/06/2015) 12/06/2015: Uses prn--only needed when having surgery related to kidney stones  . FLOVENT HFA 110 MCG/ACT inhaler INHALE 1 PUFF INTO THE LUNGS 2 (TWO) TIMES DAILY. (Patient not taking: Reported on 12/06/2015) 12/06/2015: He stopped using it a little over a week ago (hand arthritis flaring, didn't feel like using)  . levocetirizine (XYZAL) 5 MG tablet TAKE 1 TABLET (5 MG TOTAL) BY MOUTH DAILY WITH BREAKFAST. (Patient not taking: Reported on 12/06/2015) 12/06/2015: Uses in the spring  .  oxybutynin (DITROPAN) 5 MG tablet Take 1 tablet (5 mg total) by mouth 3 (three) times daily as needed for bladder spasms. (Patient not taking: Reported on 12/06/2015) 12/06/2015: Only took Aug/Sept during problems with kidney stones  . tamsulosin (FLOMAX) 0.4 MG CAPS capsule Take 1 capsule (0.4 mg total) by mouth at bedtime. (Patient not taking: Reported on 12/06/2015)  12/06/2015: Only took with kidney stones  . [DISCONTINUED] HYDROmorphone (DILAUDID) 2 MG tablet Take 1 tablet (2 mg total) by mouth every 4 (four) hours as needed for moderate pain or severe pain. (Patient not taking: Reported on 12/06/2015)   . [DISCONTINUED] phenazopyridine (PYRIDIUM) 200 MG tablet Take 1 tablet (200 mg total) by mouth 3 (three) times daily as needed for pain. (Patient taking differently: Take 100 mg by mouth 3 (three) times daily as needed (urination pain). )    No facility-administered encounter medications on file as of 12/06/2015.   No Known Allergies  ROS: denies fevers, chills, URI or allergy symptoms, wheezing, shortness of breath, cough, chest pain, palpitations, nausea, vomiting, heartburn, bowel changes, diarrhea.  +hand arthritis, right shoulder pain.  Slight pain in left shoulder and back. He has some right hip pain when sitting/driving (has seen Dr. Wynelle Link for this, reportedly has bone spur). Urinary complaints as per HPI  PHYSICAL EXAM: BP 120/74 mmHg  Pulse 64  Ht 6' 2.25" (1.886 m)  Wt 184 lb 12.8 oz (83.825 kg)  BMI 23.57 kg/m2  Well developed, pleasant, talkative male in no distress HEENT: PERRL, EOMI, conjunctiva and sclera clear. OP is clear Neck: no lymphadenopathy, thyromegaly or bruit Heart: regular rate and rhythm without murmur Lungs: clear bilaterally, no wheezes, rales, ronchi Chest: nontender, no mass Abdomen: soft, nontender, no organomegaly or mass Extremities: no edema, normal pulses.  Painful ROM of RUE. Skin: no rashes or lesions noted Psych: normal mood, affect, hygiene and grooming Neuro: alert and oriented. Normal gait, sensation, cranial nerves  Urine dip: normal Spirometry: Mild airway obstruction.  ASSESSMENT/PLAN:  Right shoulder pain - in need of arthroscopic surgery  Urinary urgency - reassured normal urine dip; f/u with Dr. Jeffie Pollock if ongoing symptoms - Plan: POCT Urinalysis Dipstick  Asthma, mild intermittent,  uncomplicated - restart Flovent daily. Flu shot encouraged, refuses - Plan: Spirometry with graph  Preoperative clearance   Reassured no evidence of UTI or recurrent stone per urine. It appears he previously was on oxybutynin and flomax--not sure if he might benefit from either of those being restarted. If symptoms persist, he should f/u with Dr. Jeffie Pollock.   FFO and will be faxed back to Dr. Maxie Better  F/u as scheduled for CPE in February

## 2015-12-20 NOTE — Addendum Note (Signed)
Addendum  created 12/20/15 1315 by Lollie Sails, CRNA   Modules edited: Charges VN

## 2015-12-20 NOTE — Transfer of Care (Signed)
Immediate Anesthesia Transfer of Care Note  Patient: [name].  Procedure(s) Performed: Procedure(s) with comments: RIGHT SHOULDER ARTHROSCOPY WITH SUBACROMIAL DECOMPRESSION WITH DEBRIDEMENT (Right) - and general EXAM UNDER ANESTHESIA WITH MANIPULATION OF RIGHT SHOULDER (Right)  Patient Location: PACU  Anesthesia Type:General  Level of Consciousness: sedated  Airway & Oxygen Therapy: Patient Spontanous Breathing and Patient connected to face mask oxygen  Post-op Assessment: Report given to RN and Post -op Vital signs reviewed and stable  Post vital signs: Reviewed and stable  Last Vitals:  Filed Vitals:   12/20/15 1058 12/20/15 1059  BP:    Pulse: 76 83  Temp:    Resp: 18 13    Complications: No apparent anesthesia complications

## 2015-12-20 NOTE — Anesthesia Procedure Notes (Addendum)
Anesthesia Regional Block:  Interscalene brachial plexus block  Pre-Anesthetic Checklist: ,, timeout performed, Correct Patient, Correct Site, Correct Laterality, Correct Procedure, Correct Position, site marked, Risks and benefits discussed,  Surgical consent,  Pre-op evaluation,  At surgeon's request and post-op pain management  Laterality: Right  Prep: chloraprep       Needles:  Injection technique: Single-shot  Needle Type: Echogenic Stimulator Needle     Needle Length: 5cm 5 cm Needle Gauge: 22 and 22 G    Additional Needles:  Procedures: ultrasound guided (picture in chart) and nerve stimulator Interscalene brachial plexus block  Nerve Stimulator or Paresthesia:  Response: bicep contraction, 0.45 mA,   Additional Responses:   Narrative:  Start time: 12/20/2015 10:26 AM End time: 12/20/2015 10:36 AM Injection made incrementally with aspirations every 5 mL.  Performed by: Personally  Anesthesiologist: Duane Boston  Additional Notes: Functioning IV was confirmed and monitors applied.  A 59mm 22ga echogenic arrow stimulator was used. Sterile prep and drape,hand hygiene and sterile gloves were used.Ultrasound guidance: relevant anatomy identified, needle position confirmed, local anesthetic spread visualized around nerve(s)., vascular puncture avoided.  Image printed for medical record.  Negative aspiration and negative test dose prior to incremental administration of local anesthetic. The patient tolerated the procedure well.   Procedure Name: Intubation Date/Time: 12/20/2015 11:07 AM Performed by: Lind Covert Pre-anesthesia Checklist: Patient identified, Emergency Drugs available, Suction available, Patient being monitored and Timeout performed Patient Re-evaluated:Patient Re-evaluated prior to inductionOxygen Delivery Method: Circle system utilized Preoxygenation: Pre-oxygenation with 100% oxygen Intubation Type: IV induction Laryngoscope Size: Mac and  4 Grade View: Grade II Tube type: Oral Tube size: 7.5 mm Number of attempts: 1 Airway Equipment and Method: Stylet Placement Confirmation: ETT inserted through vocal cords under direct vision,  positive ETCO2 and breath sounds checked- equal and bilateral Secured at: 23 cm Tube secured with: Tape Dental Injury: Teeth and Oropharynx as per pre-operative assessment

## 2015-12-20 NOTE — Anesthesia Preprocedure Evaluation (Addendum)
Anesthesia Evaluation  Patient identified by MRN, date of birth, ID band Patient awake    Reviewed: Allergy & Precautions, NPO status , Patient's Chart, lab work & pertinent test results  History of Anesthesia Complications (+) PONV and history of anesthetic complications  Airway Mallampati: II  TM Distance: >3 FB Neck ROM: Full    Dental  (+) Teeth Intact, Dental Advisory Given, Caps   Pulmonary asthma ,    Pulmonary exam normal        Cardiovascular negative cardio ROS Normal cardiovascular exam     Neuro/Psych PSYCHIATRIC DISORDERS Anxiety negative neurological ROS     GI/Hepatic negative GI ROS, Neg liver ROS,   Endo/Other  negative endocrine ROS  Renal/GU negative Renal ROS     Musculoskeletal   Abdominal   Peds  Hematology   Anesthesia Other Findings   Reproductive/Obstetrics                            Anesthesia Physical Anesthesia Plan  ASA: II  Anesthesia Plan: General   Post-op Pain Management: GA combined w/ Regional for post-op pain   Induction: Intravenous  Airway Management Planned: Oral ETT  Additional Equipment:   Intra-op Plan:   Post-operative Plan: Extubation in OR  Informed Consent: I have reviewed the patients History and Physical, chart, labs and discussed the procedure including the risks, benefits and alternatives for the proposed anesthesia with the patient or authorized representative who has indicated his/her understanding and acceptance.   Dental advisory given  Plan Discussed with: CRNA, Anesthesiologist and Surgeon  Anesthesia Plan Comments:        Anesthesia Quick Evaluation

## 2015-12-20 NOTE — Op Note (Signed)
NAMEMELFORD, SAWHNEY              ACCOUNT NO.:  [id number]  MEDICAL RECORD NO.:  [id number]  LOCATION:  [facility]                         FACILITY:  [facility]  PHYSICIAN:  Susa Day, M.D.    DATE OF BIRTH:  1946/10/24  DATE OF PROCEDURE:  12/20/2015 DATE OF DISCHARGE:                              OPERATIVE REPORT   PREOPERATIVE DIAGNOSIS:  Impingement syndrome, adhesive capsulitis, right shoulder.  POSTOPERATIVE DIAGNOSIS:  Impingement syndrome, adhesive capsulitis, right shoulder.  PROCEDURE PERFORMED: 1. Exam followed by manipulation under anesthesia. 2. Right shoulder arthroscopy, subacromial decompression, debridement,     lavage of glenohumeral joint.  ANESTHESIA:  General, preoperative interscalene block.  ASSISTANT:  Cleophas Dunker, PA  HISTORY:  A 69 year old, impingement sign, adhesive capsulitis, partially from subacromial injection indicated for manipulation, had some adhesive capsulitis, indicated for manipulation arthroscopy.  He had severe tendinitis of the supraspinatus infraspinatus, mild DJD. Moderate AC arthrosis.  Subcortical cyst, adhesive capsulitis.  Again significant relief from subacromial injection at least lidocaine portion of it.  He was indicated for manipulation and subacromial decompression. Risks and benefits were discussed including bleeding, infection, damage to neurovascular structures, suboptimal range of motion, need for open repair, etc.  TECHNIQUE:  The patient in supine beach-chair position.  After induction of adequate general anesthesia, we examined him under anesthesia.  He had about 60 degrees of abduction.  He had 110 of forward flexion, external rotation, with full internal rotation by 10 degrees, improved the forward flexion to 150 degrees.  Abduction to 100, internal rotation by 10 degrees.  We stabilized the scapulothoracic region, performed the manipulation.  Gentle lysis of adhesions were appreciated.  This was grasped  proximally.  I then prepped and draped the right upper extremity in usual sterile fashion.  Surgical marker utilized along the acromion AC joint coracoid.  Standard posterolateral portals were utilized through the skin only with a #11 blade.  First with the arm in 70-30 position, we advanced the arthroscopic camera in the glenohumeral joint penetrating atraumatically in line with coracoid visualization was suboptimal.  Fairly tight in the posterior capsule.  After re-attempt to gain access to the glenohumeral joint initially within the glenohumeral joint, redirection was unsuccessful.  Therefore, given his pathology was in the subacromial space, redirection was unsuccessful presumably due to tight posterior capsule.  I did redirect it in the subacromial space as his pathology was mainly in the subacromial space.  Hypertrophic exuberant bursa was noted.  I performed a full bursectomy with 3.5 shaver.  The rotator cuff was intact.  We released the majority of the CA ligament where there was no impingement and an increased acromial humeral distance.  We shaved the anterior lateral aspect of the acromion as well.  Again re-probed the tendon.  There was no tear noted at all, but it was hyperemic.  Following this, we then lavaged the joint and removed all the instrumentation.  Closed the portals with 4-0 nylon simple sutures, 0.25% Marcaine with epinephrine was infiltrated in the joint.  This was placed in a sling, extubated without difficulty, and transported to the recovery room in satisfactory condition.  The patient tolerated the procedure well.  No complications.  Assistant, Cleophas Dunker,  PA.  Minimal blood loss.     Susa Day, M.D.     Geralynn Rile  D:  12/20/2015  T:  12/20/2015  Job:  [id number]

## 2015-12-20 NOTE — Anesthesia Postprocedure Evaluation (Signed)
Anesthesia Post Note  Patient: [name].  Procedure(s) Performed: Procedure(s) (LRB): RIGHT SHOULDER ARTHROSCOPY WITH SUBACROMIAL DECOMPRESSION WITH DEBRIDEMENT (Right) EXAM UNDER ANESTHESIA WITH MANIPULATION OF RIGHT SHOULDER (Right)  Patient location during evaluation: PACU Anesthesia Type: General Level of consciousness: sedated Pain management: pain level controlled Vital Signs Assessment: post-procedure vital signs reviewed and stable Respiratory status: spontaneous breathing and respiratory function stable Cardiovascular status: stable Anesthetic complications: no    Last Vitals:  Filed Vitals:   12/20/15 1230 12/20/15 1245  BP: 122/62 125/69  Pulse: 68 68  Temp:  36.8 C  Resp: 12 15    Last Pain:  Filed Vitals:   12/20/15 1252  PainSc: 0-No pain                 Jian Hodgman DANIEL

## 2015-12-20 NOTE — Brief Op Note (Signed)
12/20/2015  11:55 AM  PATIENT:  [name].  69 y.o. male  PRE-OPERATIVE DIAGNOSIS:  IMPINGEMENT SYNDROME, FROZEN RIGHT SHOULDER  POST-OPERATIVE DIAGNOSIS:  IMPINGEMENT SYNDROME, FROZEN RIGHT SHOULDER  PROCEDURE:  Procedure(s) with comments: RIGHT SHOULDER ARTHROSCOPY WITH SUBACROMIAL DECOMPRESSION WITH DEBRIDEMENT (Right) - and general EXAM UNDER ANESTHESIA WITH MANIPULATION OF RIGHT SHOULDER (Right)  SURGEON:  Surgeon(s) and Role:    * Susa Day, MD - Primary  PHYSICIAN ASSISTANT:   ASSISTANTS: Bissell   ANESTHESIA:   general  EBL:     BLOOD ADMINISTERED:none  DRAINS: none   LOCAL MEDICATIONS USED:  MARCAINE     SPECIMEN:  No Specimen  DISPOSITION OF SPECIMEN:  N/A  COUNTS:  YES  TOURNIQUET:  * No tourniquets in log *  DICTATION: .Other Dictation: Dictation Number [id number]  PLAN OF CARE: Admit for overnight observation  PATIENT DISPOSITION:  PACU - hemodynamically stable.   Delay start of Pharmacological VTE agent (>24hrs) due to surgical blood loss or risk of bleeding: no

## 2015-12-21 DIAGNOSIS — M7541 Impingement syndrome of right shoulder: Secondary | ICD-10-CM | POA: Diagnosis not present

## 2015-12-21 LAB — BASIC METABOLIC PANEL
Anion gap: 7 (ref 5–15)
BUN: 18 mg/dL (ref 6–20)
CHLORIDE: 103 mmol/L (ref 101–111)
CO2: 29 mmol/L (ref 22–32)
CREATININE: 0.77 mg/dL (ref 0.61–1.24)
Calcium: 8.9 mg/dL (ref 8.9–10.3)
GFR calc Af Amer: >60 mL/min (ref >60)
GFR calc non Af Amer: >60 mL/min (ref >60)
GLUCOSE: 228 mg/dL — AB (ref 65–99)
POTASSIUM: 4.9 mmol/L (ref 3.5–5.1)
Sodium: 139 mmol/L (ref 135–145)

## 2015-12-21 MED ORDER — OXYCODONE-ACETAMINOPHEN 5-325 MG PO TABS: 1.0000 | ORAL_TABLET | ORAL | Status: DC | PRN

## 2015-12-21 MED ORDER — DOCUSATE SODIUM 100 MG PO CAPS: 100.0000 mg | ORAL_CAPSULE | Freq: Two times a day (BID) | ORAL | Status: DC | PRN

## 2015-12-21 NOTE — Discharge Summary (Signed)
Patient ID: [name]. MRN: [id number] DOB/AGE: October 17, 1946 69 y.o.  Admit date: 12/20/2015 Discharge date: 12/21/2015  Admission Diagnoses:  Principal Problem:   Impingement syndrome of right shoulder Active Problems:   Adhesive capsulitis of right shoulder   Discharge Diagnoses:  Same  Past Medical History  Diagnosis Date  . Panic disorder 2002  . Pure hypercholesterolemia   . DDD (degenerative disc disease), lumbar   . Anxiety   . History of alcohol abuse     QUIT 1993  . Mild asthma   . History of kidney stones   . Right ureteral stone   . DJD (degenerative joint disease)   . History of diverticulosis   . Renal calculus     RIGHT LOWER POLE  . Chronic back pain   . PONV (postoperative nausea and vomiting)   . Renal stone 08/04/2013  . Colon polyps 05/2015    tubular adenoma; diverticulosis; poor prep--repeat 1 year (Dr. Wynetta Emery)  . History of urinary tract infection   . Urinary urgency     Surgeries: Procedure(s): RIGHT SHOULDER ARTHROSCOPY WITH SUBACROMIAL DECOMPRESSION WITH DEBRIDEMENT EXAM UNDER ANESTHESIA WITH MANIPULATION OF RIGHT SHOULDER on 12/20/2015   Consultants:    Discharged Condition: Improved  Hospital Course: [name]. is an 69 y.o. male who was admitted 12/20/2015 for operative treatment ofImpingement syndrome of right shoulder. Patient has severe unremitting pain that affects sleep, daily activities, and work/hobbies. After pre-op clearance the patient was taken to the operating room on 12/20/2015 and underwent  Procedure(s): RIGHT SHOULDER ARTHROSCOPY WITH SUBACROMIAL DECOMPRESSION WITH DEBRIDEMENT EXAM UNDER ANESTHESIA WITH MANIPULATION OF RIGHT SHOULDER.    Patient was given perioperative antibiotics: Anti-infectives    Start     Dose/Rate Route Frequency Ordered Stop   12/20/15 1700  ceFAZolin (ANCEF) IVPB 2 g/50 mL premix     2 g 100 mL/hr over 30 Minutes Intravenous Every 6 hours 12/20/15 1300 12/20/15 1653   12/20/15 0915  ceFAZolin (ANCEF) IVPB 2 g/50 mL premix     2 g 100 mL/hr over 30 Minutes Intravenous On call to O.R. 12/20/15 0904 12/20/15 1108       Patient was given sequential compression devices, early ambulation, and chemoprophylaxis to prevent DVT.  Patient benefited maximally from hospital stay and there were no complications.    Recent vital signs: Patient Vitals for the past 24 hrs:  BP Temp Temp src Pulse Resp SpO2  12/21/15 0850 107/62 mmHg 97.8 F (36.6 C) Oral (!) 54 16 100 %  12/21/15 0519 106/60 mmHg 97.5 F (36.4 C) Oral (!) 45 16 98 %  12/21/15 0210 107/64 mmHg 97.5 F (36.4 C) Oral (!) 52 16 95 %  12/20/15 2117 (!) 100/47 mmHg 97.3 F (36.3 C) Oral (!) 52 16 96 %  12/20/15 2055 - - - - - 96 %  12/20/15 1630 (!) 109/52 mmHg 97.4 F (36.3 C) - (!) 56 15 96 %     Recent laboratory studies:  Recent Labs  12/21/15 0440  NA 139  K 4.9  CL 103  CO2 29  BUN 18  CREATININE 0.77  GLUCOSE 228*  CALCIUM 8.9     Discharge Medications:     Medication List    STOP taking these medications        HYDROcodone-acetaminophen 5-325 MG tablet  Commonly known as:  NORCO/VICODIN      TAKE these medications        albuterol 108 (90 Base) MCG/ACT inhaler  Commonly  known as:  PROVENTIL HFA;VENTOLIN HFA  Inhale 2 puffs into the lungs every 6 (six) hours as needed.     ALPRAZolam 0.5 MG tablet  Commonly known as:  XANAX  TAKE 1/2 TO 1 TABLET BY MOUTH THREE TIMES DAILY AS NEEDED FOR ANXIETY OR SLEEP.     CALCIUM CITRATE PO  Take 1.5 tablets by mouth daily.     clindamycin 300 MG capsule  Commonly known as:  CLEOCIN  Take 600 mg by mouth daily as needed (1-2 hours prior to dental work). Reported on 12/06/2015     docusate sodium 100 MG capsule  Commonly known as:  COLACE  Take 1 capsule (100 mg total) by mouth 2 (two) times daily as needed for mild constipation.     FLOVENT HFA 110 MCG/ACT inhaler  Generic drug:  fluticasone  INHALE 1 PUFF INTO THE LUNGS  2 (TWO) TIMES DAILY.     fluticasone 50 MCG/ACT nasal spray  Commonly known as:  FLONASE  USE 2 SPRAYS IN EACH NOSTRIL ONCE DAILY     levocetirizine 5 MG tablet  Commonly known as:  XYZAL  TAKE 1 TABLET (5 MG TOTAL) BY MOUTH DAILY WITH BREAKFAST.     multivitamin with minerals Tabs tablet  Take 1 tablet by mouth every morning.     oxyCODONE-acetaminophen 5-325 MG tablet  Commonly known as:  PERCOCET  Take 1 tablet by mouth every 4 (four) hours as needed.     PARoxetine 40 MG tablet  Commonly known as:  PAXIL  TAKE 1 TABLET EVERY MORNING        Diagnostic Studies: No results found.  Disposition: 01-Home or Self Care      Discharge Instructions    Call MD / Call 911    Complete by:  As directed   If you experience chest pain or shortness of breath, CALL 911 and be transported to the hospital emergency room.  If you develope a fever above 101 F, pus (white drainage) or increased drainage or redness at the wound, or calf pain, call your surgeon's office.     Constipation Prevention    Complete by:  As directed   Drink plenty of fluids.  Prune juice may be helpful.  You may use a stool softener, such as Colace (over the counter) 100 mg twice a day.  Use MiraLax (over the counter) for constipation as needed.     Diet - low sodium heart healthy    Complete by:  As directed      Increase activity slowly as tolerated    Complete by:  As directed               Signed: Johnny Valdez M. 12/21/2015, 4:11 PM

## 2015-12-21 NOTE — Evaluation (Signed)
Occupational Therapy Evaluation Patient Details Name: [name]. MRN: [id number] DOB: [birth year] Today's Date: 12/21/2015    History of Present Illness pt is s/p shoulder arthroscopy, SAD, debridement and lavage to [facility] joint   Clinical Impression   This 69 year old man was admitted for the above surgery. All education was completed.  Pt will follow up with Dr Tonita Cong for further shoulder rehab.    Follow Up Recommendations   (pt will follow up with Dr Tonita Cong)    Equipment Recommendations  None recommended by OT    Recommendations for Other Services       Precautions / Restrictions Precautions Precautions: Shoulder Type of Shoulder Precautions: active protocol; sling at all times except bathing/dressing, AROM elbow, wrist and fingers.  Spoke to [place], [place]:  pt is able to move shoulder to comfort and sling for comfort Restrictions Weight Bearing Restrictions: No      Mobility Bed Mobility               General bed mobility comments: pt was at eob  Transfers Overall transfer level: Needs assistance   Transfers: Sit to/from Stand Sit to Stand: Modified independent (Device/Increase time)         General transfer comment: extra time; pt is tall (6'4")    Balance                                            ADL Overall ADL's : Needs assistance/impaired     Grooming: Supervision/safety;Bed level   Upper Body Bathing: Set up;Sitting   Lower Body Bathing: Minimal assistance;Sit to/from stand (assist for socks; pt can manage pants. Will have assistance )   Upper Body Dressing : Set up;Sitting   Lower Body Dressing: Moderate assistance;Sit to/from stand (will have assistance for socks)   Toilet Transfer: Supervision/safety;Ambulation;BSC   Toileting- Water quality scientist and Hygiene: Supervision/safety;Sit to/from stand         General ADL Comments: practiced donning and doffing sling.  Pt is able to move R shoulder and can  manage this.  Neighbors can assist with socks. Reviewed shoulder education:  see education section of chart     Vision     Perception     Praxis      Pertinent Vitals/Pain Pain Assessment: 0-10 Pain Score: 2  Pain Location: R shoulder Pain Descriptors / Indicators: Sore Pain Intervention(s): Limited activity within patient's tolerance;Monitored during session;Premedicated before session;Repositioned;Ice applied     Hand Dominance Right   Extremity/Trunk Assessment Upper Extremity Assessment Upper Extremity Assessment: RUE deficits/detail RUE Deficits / Details: immobilized; c/o numbness/tingling in 4th and 5th digits           Communication Communication Communication: No difficulties   Cognition Arousal/Alertness: Awake/alert Behavior During Therapy: WFL for tasks assessed/performed Overall Cognitive Status: Within Functional Limits for tasks assessed                     General Comments       Exercises       Shoulder Instructions      Home Living Family/patient expects to be discharged to:: Private residence Living Arrangements: Alone                 Bathroom Shower/Tub: Tub/shower unit (will sponge bathe)   Bathroom Toilet: Handicapped height     Home Equipment: Bedside commode;Walker - 2 wheels  Additional Comments: pt has been using a sock aide since his hip sx; he walks without a device      Prior Functioning/Environment Level of Independence: Independent with assistive device(s)        Comments: has neighbors who will help him    OT Diagnosis: Acute pain   OT Problem List: Decreased strength;Decreased range of motion;Pain   OT Treatment/Interventions:      OT Goals(Current goals can be found in the care plan section) Acute Rehab OT Goals Patient Stated Goal: get back to being independent OT Goal Formulation: All assessment and education complete, DC therapy  OT Frequency:     Barriers to D/C:             Co-evaluation              End of Session    Activity Tolerance: Patient tolerated treatment well Patient left: in chair;with call bell/phone within reach   Time: 0926-0957 OT Time Calculation (min): 31 min Charges:  OT General Charges $OT Visit: 1 Procedure OT Evaluation $Initial OT Evaluation Tier I: 1 Procedure OT Treatments $Self Care/Home Management : 8-22 mins G-Codes: OT G-codes **NOT FOR INPATIENT CLASS** Functional Assessment Tool Used: clinical observation and judgment Functional Limitation: Self care Self Care Current Status [id number]): At least 40 percent but less than 60 percent impaired, limited or restricted Self Care Goal Status [id number]): At least 40 percent but less than 60 percent impaired, limited or restricted Self Care Discharge Status [phone]): At least 40 percent but less than 60 percent impaired, limited or restricted  [facility] 12/21/2015, 11:17 AM Lesle Chris, OTR/L [phone] 12/21/2015

## 2015-12-21 NOTE — Progress Notes (Signed)
Subjective: 1 Day Post-Op Procedure(s) (LRB): RIGHT SHOULDER ARTHROSCOPY WITH SUBACROMIAL DECOMPRESSION WITH DEBRIDEMENT (Right) EXAM UNDER ANESTHESIA WITH MANIPULATION OF RIGHT SHOULDER (Right) Patient reports pain as 3 on 0-10 scale.    Objective: Vital signs in last 24 hours: Temp:  [97.3 F (36.3 C)-98.4 F (36.9 C)] 97.8 F (36.6 C) (12/30 0850) Pulse Rate:  [45-78] 54 (12/30 0850) Resp:  [12-16] 16 (12/30 0850) BP: (100-131)/(47-77) 107/62 mmHg (12/30 0850) SpO2:  [95 %-100 %] 100 % (12/30 0850)  Intake/Output from previous day: 12/29 0701 - 12/30 0700 In: 2921.7 [P.O.:1240; I.V.:1681.7] Out: 1150 [Urine:1150] Intake/Output this shift: Total I/O In: 120 [P.O.:120] Out: -   No results for input(s): HGB in the last 72 hours. No results for input(s): WBC, RBC, HCT, PLT in the last 72 hours.  Recent Labs  12/21/15 0440  NA 139  K 4.9  CL 103  CO2 29  BUN 18  CREATININE 0.77  GLUCOSE 228*  CALCIUM 8.9   No results for input(s): LABPT, INR in the last 72 hours.  Neurologically intact Neurovascular intact Sensation intact distally No cellulitis present Compartment soft  Assessment/Plan: 1 Day Post-Op Procedure(s) (LRB): RIGHT SHOULDER ARTHROSCOPY WITH SUBACROMIAL DECOMPRESSION WITH DEBRIDEMENT (Right) EXAM UNDER ANESTHESIA WITH MANIPULATION OF RIGHT SHOULDER (Right) Discharge home with home health. Instr given  Sahand Gosch C 12/21/2015, 11:36 AM

## 2015-12-24 ENCOUNTER — Telehealth: Payer: Self-pay | Admitting: Family Medicine

## 2016-01-07 ENCOUNTER — Encounter: Payer: Self-pay | Admitting: Family Medicine

## 2016-01-08 ENCOUNTER — Telehealth: Payer: Self-pay

## 2016-01-08 NOTE — Telephone Encounter (Signed)
Pt called to say he needed a refill on his Alprazolam 0.5mg  sent to [facility] on the [address] of [address]

## 2016-01-08 NOTE — Telephone Encounter (Signed)
Fill called to pharmacy with no refills

## 2016-01-08 NOTE — Telephone Encounter (Signed)
He appears to be using much more frequently, likely related to surgery and for sleep.  Okay to refill just this time, no additional refills before his appointment next month.

## 2016-01-09 DIAGNOSIS — M7501 Adhesive capsulitis of right shoulder: Secondary | ICD-10-CM | POA: Diagnosis not present

## 2016-01-09 NOTE — Telephone Encounter (Signed)
Recv'd fax back stating pt doesn't have an active [employer] plan.  Called pharmacy & Alprazolam went thru with new ins, so P.A. Not needed

## 2016-01-14 DIAGNOSIS — M7501 Adhesive capsulitis of right shoulder: Secondary | ICD-10-CM | POA: Diagnosis not present

## 2016-01-17 DIAGNOSIS — M7501 Adhesive capsulitis of right shoulder: Secondary | ICD-10-CM | POA: Diagnosis not present

## 2016-01-21 DIAGNOSIS — M7501 Adhesive capsulitis of right shoulder: Secondary | ICD-10-CM | POA: Diagnosis not present

## 2016-01-24 DIAGNOSIS — M7501 Adhesive capsulitis of right shoulder: Secondary | ICD-10-CM | POA: Diagnosis not present

## 2016-01-28 ENCOUNTER — Telehealth: Payer: Self-pay | Admitting: Family Medicine

## 2016-01-28 MED ORDER — ALPRAZOLAM 0.5 MG PO TABS: ORAL_TABLET | ORAL | Status: DC

## 2016-01-28 NOTE — Telephone Encounter (Signed)
Given his recent shoulder surgery, okay to refill.  If continues with this frequency, will need OV

## 2016-01-28 NOTE — Telephone Encounter (Signed)
Requesting refill on Alprazolam 0.5mg 

## 2016-01-28 NOTE — Telephone Encounter (Signed)
Patient advised.

## 2016-01-31 DIAGNOSIS — M7501 Adhesive capsulitis of right shoulder: Secondary | ICD-10-CM | POA: Diagnosis not present

## 2016-02-14 ENCOUNTER — Ambulatory Visit: Payer: Medicare Other | Admitting: Family Medicine

## 2016-03-24 ENCOUNTER — Other Ambulatory Visit: Payer: Self-pay | Admitting: Family Medicine

## 2016-03-24 NOTE — Telephone Encounter (Signed)
Is this okay to refill? Last CPE was 01/2015, did have surgical clearance appt 11/2015-not scheduled again until 09/2016 for CPE. Wasn't sure if you thought he needed a med check before his Oct appt. Please advise, thanks.

## 2016-03-24 NOTE — Telephone Encounter (Signed)
Ok to refill until his CPE 

## 2016-04-15 ENCOUNTER — Other Ambulatory Visit: Payer: Self-pay | Admitting: Family Medicine

## 2016-04-15 NOTE — Telephone Encounter (Signed)
Not sure how Dr.Knapp handles inhalers, Is this ok to refill?

## 2016-04-30 ENCOUNTER — Ambulatory Visit (INDEPENDENT_AMBULATORY_CARE_PROVIDER_SITE_OTHER): Payer: PPO | Admitting: Podiatry

## 2016-04-30 ENCOUNTER — Encounter: Payer: Self-pay | Admitting: Podiatry

## 2016-04-30 VITALS — BP 136/89 | HR 68 | Resp 14

## 2016-04-30 DIAGNOSIS — L609 Nail disorder, unspecified: Secondary | ICD-10-CM | POA: Diagnosis not present

## 2016-04-30 NOTE — Progress Notes (Signed)
   Subjective:    Patient ID: [name]., male    DOB: December 03, 1946, 70 y.o.   MRN: [id number]  HPI this patient presents to the office with chief complaint of a deformed fungal nail big toe left foot. Patient states it has been deformed for over 2 years, but he is not experiencing any pain. He did notice that there was a break in the nail and he attempted to work on the nail himself drawing blood. Therefore, he decided to make an appointment with this office for an evaluation and treatment of this left big toenail  Review of Systems  All other systems reviewed and are negative.      Objective:   Physical Exam GENERAL APPEARANCE: Alert, conversant. Appropriately groomed. No acute distress.  VASCULAR: Pedal pulses are  palpable at  [facility] and PT bilateral.  Capillary refill time is immediate to all digits,  Normal temperature gradient.  Digital hair growth is present bilateral  NEUROLOGIC: sensation is normal to 5.07 monofilament at 5/5 sites bilateral.  Light touch is intact bilateral, Muscle strength normal.  MUSCULOSKELETAL: acceptable muscle strength, tone and stability bilateral.  Intrinsic muscluature intact bilateral.  Rectus appearance of foot and digits noted bilateral.   DERMATOLOGIC: skin color, texture, and turgor are within normal limits.  No preulcerative lesions or ulcers  are seen, no interdigital maceration noted.  No open lesions present.   No drainage noted.  Asymptomatic thick hallux toenail left foot. No redness or drainage noted. There is a transverse break in nail plate.         Assessment & Plan:  Injured deformed hallux toenail left foot.  IE  Debride thick disfigured discolored nail left hallux. RTC prn   Gardiner Barefoot DPM

## 2016-06-09 DIAGNOSIS — N200 Calculus of kidney: Secondary | ICD-10-CM | POA: Diagnosis not present

## 2016-06-12 DIAGNOSIS — L814 Other melanin hyperpigmentation: Secondary | ICD-10-CM | POA: Diagnosis not present

## 2016-06-12 DIAGNOSIS — D225 Melanocytic nevi of trunk: Secondary | ICD-10-CM | POA: Diagnosis not present

## 2016-06-12 DIAGNOSIS — L600 Ingrowing nail: Secondary | ICD-10-CM | POA: Diagnosis not present

## 2016-06-12 DIAGNOSIS — L821 Other seborrheic keratosis: Secondary | ICD-10-CM | POA: Diagnosis not present

## 2016-07-23 ENCOUNTER — Other Ambulatory Visit: Payer: Self-pay | Admitting: Family Medicine

## 2016-08-28 ENCOUNTER — Encounter: Payer: Self-pay | Admitting: Podiatry

## 2016-08-28 ENCOUNTER — Ambulatory Visit (INDEPENDENT_AMBULATORY_CARE_PROVIDER_SITE_OTHER): Payer: PPO | Admitting: Podiatry

## 2016-08-28 VITALS — BP 122/77 | HR 61 | Resp 14

## 2016-08-28 DIAGNOSIS — M79676 Pain in unspecified toe(s): Secondary | ICD-10-CM | POA: Diagnosis not present

## 2016-08-28 DIAGNOSIS — B351 Tinea unguium: Secondary | ICD-10-CM | POA: Diagnosis not present

## 2016-08-28 NOTE — Progress Notes (Signed)
   Subjective:    Patient ID: Larry Muse., male    DOB: 06/20/1946, 70 y.o.   MRN: [id number]  HPI this patient presents to the office with chief complaint of a deformed fungal nail big toe left foot. Patient  Says he is also having pain on the outside border of the right big toe for 3 months. Patient has attempted to self treat, but the pain persists. He presents the office for continued evaluation and treatment of the great toenails both feet  Review of Systems  All other systems reviewed and are negative.      Objective:   Physical Exam GENERAL APPEARANCE: Alert, conversant. Appropriately groomed. No acute distress.  VASCULAR: Pedal pulses are  palpable at  [facility] and PT bilateral.  Capillary refill time is immediate to all digits,  Normal temperature gradient.  Digital hair growth is present bilateral  NEUROLOGIC: sensation is normal to 5.07 monofilament at 5/5 sites bilateral.  Light touch is intact bilateral, Muscle strength normal.  MUSCULOSKELETAL: acceptable muscle strength, tone and stability bilateral.  Intrinsic muscluature intact bilateral.  Rectus appearance of foot and digits noted bilateral.   DERMATOLOGIC: skin color, texture, and turgor are within normal limits.  No preulcerative lesions or ulcers  are seen, no interdigital maceration noted.  No open lesions present.   No drainage noted.  Asymptomatic thick hallux toenail left foot. No redness or drainage noted. There is a transverse break in nail plate.         Assessment & Plan:  Injured deformed hallux toenail left foot.  IE  Debride thick disfigured discolored nail left hallux. RTC prn   Gardiner Barefoot DPM

## 2016-08-29 DIAGNOSIS — L0292 Furuncle, unspecified: Secondary | ICD-10-CM | POA: Diagnosis not present

## 2016-09-18 ENCOUNTER — Other Ambulatory Visit: Payer: Self-pay | Admitting: Family Medicine

## 2016-09-26 NOTE — Progress Notes (Signed)
Chief Complaint  Patient presents with  . Medicare Wellness    fasting annual wellness/CPE. No concerns.   . Flu Vaccine    declined.    [name]. is a 70 y.o. male who presents for annual exam, Medicare wellness visit and follow-up on chronic medical conditions.  He has the following concerns:  He has been having some recurrent pain in the left hip 2-3 weeks ago. He has an appointment with Dr. Anne Fu PA later this week.  He had revision in 2012, and additional surgery (on the tendon) in 2013.  It hurts when he went bowling, the hip felt unstable.    H/o Kidney stones:  summer 2016 he had bilateral kidney stones, requiring surgery and stenting.  No further/recent problems. Under the care of Dr. Jeffie Pollock.  Asthma: doing well overall. He uses Flovent 1 inhalation every morning, and has not needed to use a rescue inhaler in a long time.  He has albuterol at home, but hasn't been needing it.  Mild airway obstruction noted on spirometry in 11/2015, when he had been off his Flovent for about a week or longer.  He reports he lost weight down to 160# (had been up to 210# at one point) related to his last shoulder surgery.  He felt like he lost all of his muscles (due to flare of arthritis throughout his body after the surgery), but was able to get back to exercising and get the muscles back.  Allergies:   He takes Xyzal April through June, takes just seasonally in the Spring. Has not been needing now, and not complaining of any allergy symptoms.  He continues to take Flonase daily year-round (1 spray each nostril).  Anxiety and panic disorder: Well controlled with Paxil. Denies any anxiety or panic attacks.  He got xanax prescription last year to have on hand, just in case. He took this after his last surgery when he had a lot of pain at night and had trouble sleeping due to the residual pain (not treated by the pain med).  He is out of these now, and has been for a while. He would like  to have another prescription on hand, to use prn anxiety.  Hyperlipidemia: He has been prescribed meds in the past, but admits to never taking them, worries about effects on liver. At one point he took red yeast rice, maybe for 3-4 months, but never had labs while taking it.  Hasn't taken it in well over a year. He didn't have any side effects. He rarely eats red meat. He eats a lot of Healthy Choice steamer meals, with chicken. He no longer is eating soups. He still avoids mayonnaise, instead using mustard on his sandwiches. He no longer eats any peanut M&M's (felt they contributed to his kidney stones).  He refuses flu shots and pneumonia shot--as previously discussed, he has done "research" and read about the toxins. Absolutely refuses. He feels sick for about a week after taking the flu shot.   Immunization History  Administered Date(s) Administered  . Influenza Split 11/12/2011  . Influenza, Seasonal, Injecte, Preservative Fre 11/17/2012  . Pneumococcal Polysaccharide-23 05/22/2001  . Td 05/22/2001  . Tdap 09/11/2008   Last colonoscopy: 05/2015 Dr. Wynetta Emery. Had tubular adenoma, but had poor prep.  Recommended repeating in 1 year. Had been getting every 5 years prior to this. He reports he didn't receive notification to schedule his 1 year f/u colonoscopy. Last PSA: 01/2015 by [place]. He thinks he had it  checked through urologist within the year. Dentist: once yearly Ophtho: several years ago  Exercise daily--20 minutes on the elliptical daily, plus ab lounger (200 crunches, takes 3 mins), 5-6 days/week. He also does resistance work for upper and lower body (bands, dumbbells) 2x/week.   Other MD's involved in patient's care:  Dr. Wynelle Link (ortho for hip) Dr. Maxie Better (ortho for shoulder)  Dr. Geroge Baseman (dentist).  Dr. Jeffie Pollock, Dr. Tresa Moore (urologist) Dr. Wynetta Emery (GI) Justin Mend (PA at [facility]'s office) Dr. Prudence Davidson (podiatry) ENT (not since 2006, for his surgery).  End of life  issues: Doesn't have a living will or health care power of attorney--he was given paperwork last year, given again this year, and discussed the importance of why he should discuss this with his family and fill these out.  Depression Screen: negative Fall screen: negative Functional status survey: only notable for recent left hip pain affecting his walking  Past Medical History:  Diagnosis Date  . Anxiety   . Chronic back pain   . Colon polyps 05/2015   tubular adenoma; diverticulosis; poor prep--repeat 1 year (Dr. Wynetta Emery)  . DDD (degenerative disc disease), lumbar   . DJD (degenerative joint disease)   . History of alcohol abuse    QUIT 1993  . History of diverticulosis   . History of kidney stones   . History of urinary tract infection   . Mild asthma   . Panic disorder 2002  . PONV (postoperative nausea and vomiting)   . Pure hypercholesterolemia   . Renal calculus    RIGHT LOWER POLE  . Renal stone 08/04/2013  . Right ureteral stone   . Urinary urgency     Past Surgical History:  Procedure Laterality Date  . APPENDECTOMY  age 12  . CERVICAL FUSION  1993  . CLOSED LEFT SHOULDER MANIPULATION/ EXCISION LIPOMA  02-15-2009  . COLONOSCOPY WITH PROPOFOL N/A 06/19/2015   Procedure: COLONOSCOPY WITH PROPOFOL;  Surgeon: Garlan Fair, MD;  Location: [facility];  Service: Endoscopy;  Laterality: N/A;  . CYSTOSCOPY W/ URETERAL STENT PLACEMENT Left 08/14/2015   Procedure: CYSTOSCOPY WITH RETROGRADE PYELOGRAM/URETERAL STENT PLACEMENT;  Surgeon: Alexis Frock, MD;  Location: [facility];  Service: Urology;  Laterality: Left;  . CYSTOSCOPY W/ URETERAL STENT REMOVAL Bilateral 09/04/2015   Procedure: CYSTOSCOPY WITH BILATERAL STENT REMOVAL;  Surgeon: Irine Seal, MD;  Location: [facility];  Service: Urology;  Laterality: Bilateral;  . CYSTOSCOPY WITH RETROGRADE PYELOGRAM, URETEROSCOPY AND STENT PLACEMENT Right 08/04/2013   Procedure: STONE BASKETRY AND STENT PLACEMENT;  Surgeon: Malka So, MD;   Location: [facility];  Service: Urology;  Laterality: Right;  URETEROSCOPY WITH LASER APPLICATION  . CYSTOSCOPY WITH RETROGRADE PYELOGRAM, URETEROSCOPY AND STENT PLACEMENT Right 08/14/2015   Procedure: CYSTOSCOPY WITH RETROGRADE PYELOGRAM, DIGITAL URETEROSCOPY AND STENT PLACEMENT;  Surgeon: Alexis Frock, MD;  Location: [facility];  Service: Urology;  Laterality: Right;  . [place]  . EXAM UNDER ANESTHESIA WITH MANIPULATION OF SHOULDER Right 12/20/2015   Procedure: EXAM UNDER ANESTHESIA WITH MANIPULATION OF RIGHT SHOULDER;  Surgeon: Susa Day, MD;  Location: [facility];  Service: Orthopedics;  Laterality: Right;  . EXCISION/RELEASE BURSA HIP  10/26/2012   Procedure: EXCISION/RELEASE BURSA HIP;  Surgeon: Gearlean Alf, MD;  Location: [facility];  Service: Orthopedics;  Laterality: Left;  Left Hip Bursectomy with Tendon Repair  . HOLMIUM LASER APPLICATION Right 0/62/6948   Procedure: HOLMIUM LASER APPLICATION;  Surgeon: Alexis Frock, MD;  Location: [facility];  Service: Urology;  Laterality: Right;  . HOLMIUM LASER APPLICATION Left 2/45/8099   Procedure: HOLMIUM LASER APPLICATION;  Surgeon: Irine Seal, MD;  Location: [facility];  Service: Urology;  Laterality: Left;  . JOINT REPLACEMENT    . KNEE ARTHROSCOPY [place]  . LAPAROSCOPIC CHOLECYSTECTOMY  1998  . left heel spur surgery  1989  . LEFT URETEROSCOPIC STONE EXTRACTION  04-17-2009  . NASAL POLYP SURGERY  1993,repeat 2006  . NEPHROLITHOTOMY Left 09/04/2015   Procedure: 1ST STAGE LEFT PERCUTANEOUS NEPHROLITHOTOMY ;  Surgeon: Irine Seal, MD;  Location: [facility];  Service: Urology;  Laterality: Left;  . RIGHT URETEROSCOPIC HOLIUM LASERTRIPSY/ STENT PLACEMENT  05-15-2011  . SHOULDER ARTHROSCOPY WITH ROTATOR CUFF REPAIR AND SUBACROMIAL DECOMPRESSION Left 10-05-2008   AND DEBRIDEMENT LABRAL  . SHOULDER ARTHROSCOPY WITH SUBACROMIAL DECOMPRESSION Right 12/20/2015   Procedure: RIGHT SHOULDER ARTHROSCOPY WITH SUBACROMIAL  DECOMPRESSION WITH DEBRIDEMENT;  Surgeon: Susa Day, MD;  Location: [facility];  Service: Orthopedics;  Laterality: Right;  and general  . TENDON REPAIR  10/26/2012   Procedure: TENDON REPAIR;  Surgeon: Gearlean Alf, MD;  Location: [facility];  Service: Orthopedics;  Laterality: Left;  . TONSILLECTOMY AND ADENOIDECTOMY  age 38  . TOTAL HIP ARTHROPLASTY Left 04/26/2010   Dr. Wynelle Link  . TOTAL HIP REVISION  12/01/2011   Procedure: TOTAL HIP REVISION;  Surgeon: Dione Plover Aluisio;  Location: [facility];  Service: Orthopedics;  Laterality: Left;    Social History   Social History  . Marital status: Divorced    Spouse name: N/A  . Number of children: 2  . Years of education: N/A   Occupational History  . retired from [employer]  . Smoking status: Never Smoker  . Smokeless tobacco: Never Used  . Alcohol use No     Comment: quit alcohol 04/20/1992  . Drug use: No  . Sexual activity: Not Currently   Other Topics Concern  . Not on file   Social History Narrative   Lives alone, 1 dog (another mini daschund, Jojo, after he had to put cocoa to sleep at age 55.5). Children are in [place], [place], 3 grandchildren.   He plans to move back to [place] (after hip replacement lawsuit settles).    Family History  Problem Relation Age of Onset  . Hypertension Mother   . Anxiety disorder Mother   . Cancer Mother     male cancer  . Cancer Father     colon  . Colon cancer Father   . Panic disorder Daughter   . Diverticulosis Daughter   . Alcohol abuse Brother   . Cancer Maternal Grandmother     breast and stomach  . Heart disease Maternal Grandmother   . Cancer Paternal Grandmother 63    colon cancer  . Colon cancer Paternal Grandmother   . Diabetes Neg Hx     Outpatient Encounter Prescriptions as of 09/29/2016  Medication Sig Note  . CALCIUM CITRATE PO Take 1 tablet by mouth daily.  12/12/2015: Last dose 12/12/2015   . FLOVENT HFA 110 MCG/ACT inhaler  INHALE ONE PUFF INTO LUNGS TWICE DAILY 09/29/2016: Uses 1 puff once daily  . fluticasone (FLONASE) 50 MCG/ACT nasal spray INSTILL TWO SPRAYS INTO EACH NOSTRIL EVERY DAY 09/29/2016: Uses 1 spray each nostril daily  . Multiple Vitamin (MULTIVITAMIN WITH MINERALS) TABS Take 1 tablet by mouth every morning.  12/12/2015: Last dose 12/12/2015  . PARoxetine (PAXIL) 40 MG tablet TAKE 1 TABLET  BY MOUTH EVERY MORNING   . albuterol (PROVENTIL HFA;VENTOLIN HFA) 108 (90 BASE) MCG/ACT inhaler Inhale 2 puffs into the lungs every 6 (six) hours as needed. (Patient not taking: Reported on 09/29/2016)   . ALPRAZolam (XANAX) 0.5 MG tablet TAKE 1/2 TO 1 TABLET BY MOUTH THREE TIMES DAILY AS NEEDED FOR ANXIETY OR SLEEP. (Patient not taking: Reported on 09/29/2016)   . levocetirizine (XYZAL) 5 MG tablet TAKE 1 TABLET (5 MG TOTAL) BY MOUTH DAILY WITH BREAKFAST. (Patient not taking: Reported on 09/29/2016) 09/29/2016: Uses April through June  . [DISCONTINUED] clindamycin (CLEOCIN) 300 MG capsule Take 600 mg by mouth daily as needed (1-2 hours prior to dental work). Reported on 12/06/2015   . [DISCONTINUED] docusate sodium (COLACE) 100 MG capsule Take 1 capsule (100 mg total) by mouth 2 (two) times daily as needed for mild constipation.   . [DISCONTINUED] oxyCODONE-acetaminophen (PERCOCET) 5-325 MG tablet Take 1 tablet by mouth every 4 (four) hours as needed.    No facility-administered encounter medications on file as of 09/29/2016.     No Known Allergies   ROS: The patient denies anorexia, fever, headaches, vision loss, decreased hearing, ear pain, hoarseness, chest pain, palpitations, dizziness, syncope, dyspnea on exertion, cough, swelling, nausea, vomiting, diarrhea, constipation, abdominal pain, melena, hematochezia, indigestion/heartburn, incontinence, erectile dysfunction, nocturia, weakened urine stream, dysuria, genital lesions, joint pains (arthritis in neck, back, knees--going on for a long time and he is "used to it".   Takes tylenol occasionally if worse).  Denies numbness, tingling, weakness, tremor, suspicious skin lesions, depression, anxiety, insomnia, abnormal bleeding/bruising, or enlarged lymph nodes  Up 1-2 times/night to void, drinks a lot of water at night. Regained the weight he had lost after his last surgery; happy at his current weight. Recent recurrent left hip pain x 2-3 weeks.  PHYSICAL EXAM:  BP 122/72 (BP Location: Left Arm, Patient Position: Sitting, Cuff Size: Normal)   Pulse 64   Ht 6' 2.25" (1.886 m)   Wt 193 lb 3.2 oz (87.6 kg)   BMI 24.64 kg/m   General Appearance:  Alert, cooperative, no distress, appears somewhat older than stated age   Head:  Normocephalic, without obvious abnormality, atraumatic   Eyes:  PERRL, conjunctiva/corneas clear, EOM's intact, fundi benign   Ears:  Normal TM's and external ear canals. Mod cerumen in both ears, partially obscuring TM's  Nose:  Nares normal, mucosa normal, no drainage or sinus tenderness   Throat:  Lips, mucosa, and tongue normal; teeth and gums normal   Neck:  Supple, no lymphadenopathy; thyroid: no enlargement/tenderness/nodules; no carotid bruit or JVD   Back:  Spine nontender, no curvature, ROM normal, no CVA tenderness.  Lungs:  Clear to auscultation bilaterally without wheezes, rales or ronchi; respirations unlabored   Chest Wall:  No tenderness or deformity   Heart:  Regular rate and rhythm, S1 and S2 normal, no murmur, rub or gallop   Breast Exam:  No chest wall tenderness, masses or gynecomastia   Abdomen:  Soft, non-tender, nondistended, normoactive bowel sounds, no masses, no hepatosplenomegaly   Genitalia:  Normal male external genitalia without lesions. Testicles without masses. No inguinal hernias.   Rectal:  Normal sphincter tone, no masses or tenderness; guaiac negative stool. Prostate smooth, no nodules, mildly enlarged.   Extremities:  No clubbing, cyanosis or edema. Some pain at  lateral hips bilaterally with external rotation.  No significant limitation of ROM.    Pulses:  2+ and symmetric all extremities   Skin:  Skin color, texture,  turgor normal, no rashes or lesions.  Lymph nodes:  Cervical, supraclavicular, and axillary nodes normal   Neurologic:  CNII-XII intact, normal strength, sensation and gait; reflexes 2+ and symmetric throughout   Psych:  Normal mood, affect, hygiene and grooming    ASSESSMENT/PLAN:  Annual physical exam - Plan: Visual acuity screening, POCT Urinalysis Dipstick, Comprehensive metabolic panel, Lipid panel, CBC with Differential/Platelet, TSH, Hepatitis C antibody  History of colonic polyps - past due for recheck (due to poor prep)--reminded to call GI and schedule  Pure hypercholesterolemia - won't take meds; compliant with low cholesterol diet.  Recheck lipid panel - Plan: Comprehensive metabolic panel, Lipid panel  PANIC DISORDER - well controlled - Plan: PARoxetine (PAXIL) 40 MG tablet, ALPRAZolam (XANAX) 0.5 MG tablet  Seasonal allergic rhinitis, unspecified chronicity, unspecified trigger - continue Flonase (increase to 2 sprays and restart Xyzal with flares)  Medicare annual wellness visit, subsequent  Encounter for hepatitis C screening test for low risk patient - Plan: Hepatitis C antibody  Loss of weight - post-operatively; now back to regular exercise and has regained muscle mass. normal appetite - Plan: TSH  Asthma, unspecified asthma severity, unspecified whether complicated, unspecified whether persistent - significant obstruction on spirometry, ?technique-related? On very low dose; asymptomatic. refer to pulm for further assessment and med modification if needed - Plan: Spirometry with Graph, Spirometry with Graph   Hep C Ab, lipid, CBC, c-met, TSH  Recommended at least 30 minutes of aerobic activity at least 5 days/week; weight-bearing exercise at least 2x/wk, weight-bearing exercise at least  2x/wk; proper sunscreen use reviewed; healthy diet and alcohol recommendations (less than or equal to 2 drinks/day) reviewed; regular seatbelt use; changing batteries in smoke detectors. Self-testicular exams. Immunization recommendations discussed at length, refuses all. Strongly encouraged Prevnar-13 (and followed by pneumovax in future), as well as yearly flu shots, and zostavax. Colonoscopy--past due for 1 year recheck (given poor prep and adenomatous polyp).  End of life care--Encouraged him to fill out paperwork on Living Will and healthcare power of attorney (again). MOST form reviewed and updated.  Allergies--controlled  Asthma--controlled historically. Had been advised to increase flovent to BID if symptoms worsen, requiring albuterol prn.  However, given the findings on spirometry, rec pulm eval.  Refilled the xanax--educated not to use prn for sleep, just for anxiety.   Medicare Attestation I have personally reviewed: The patient's medical and social history Their use of alcohol, tobacco or illicit drugs Their current medications and supplements The patient's functional ability including ADLs,fall risks, home safety risks, cognitive, and hearing and visual impairment Diet and physical activities Evidence for depression or mood disorders  The patient's weight, height, BMI, and visual acuity have been recorded in the chart.  I have made referrals, counseling, and provided education to the patient based on review of the above and I have provided the patient with a written personalized care plan for preventive services.     Tyrann Donaho A, MD   09/26/2016

## 2016-09-29 ENCOUNTER — Encounter: Payer: Self-pay | Admitting: Family Medicine

## 2016-09-29 ENCOUNTER — Ambulatory Visit (INDEPENDENT_AMBULATORY_CARE_PROVIDER_SITE_OTHER): Payer: PPO | Admitting: Family Medicine

## 2016-09-29 VITALS — BP 122/72 | HR 64 | Ht 74.25 in | Wt 193.2 lb

## 2016-09-29 DIAGNOSIS — E7800 Pure hypercholesterolemia, unspecified: Secondary | ICD-10-CM

## 2016-09-29 DIAGNOSIS — F410 Panic disorder [episodic paroxysmal anxiety] without agoraphobia: Secondary | ICD-10-CM

## 2016-09-29 DIAGNOSIS — R634 Abnormal weight loss: Secondary | ICD-10-CM

## 2016-09-29 DIAGNOSIS — Z0000 Encounter for general adult medical examination without abnormal findings: Secondary | ICD-10-CM | POA: Diagnosis not present

## 2016-09-29 DIAGNOSIS — Z86010 Personal history of colonic polyps: Secondary | ICD-10-CM | POA: Diagnosis not present

## 2016-09-29 DIAGNOSIS — J302 Other seasonal allergic rhinitis: Secondary | ICD-10-CM | POA: Diagnosis not present

## 2016-09-29 DIAGNOSIS — Z1159 Encounter for screening for other viral diseases: Secondary | ICD-10-CM | POA: Diagnosis not present

## 2016-09-29 DIAGNOSIS — J45909 Unspecified asthma, uncomplicated: Secondary | ICD-10-CM

## 2016-09-29 LAB — CBC WITH DIFFERENTIAL/PLATELET
BASOS PCT: 0 %
Basophils Absolute: 0 cells/uL (ref 0–200)
Eosinophils Absolute: 282 cells/uL (ref 15–500)
Eosinophils Relative: 6 %
HEMATOCRIT: 40.9 % (ref 38.5–50.0)
HEMOGLOBIN: 13.8 g/dL (ref 13.2–17.1)
LYMPHS ABS: 1269 {cells}/uL (ref 850–3900)
Lymphocytes Relative: 27 %
MCH: 33.4 pg — ABNORMAL HIGH (ref 27.0–33.0)
MCHC: 33.7 g/dL (ref 32.0–36.0)
MCV: 99 fL (ref 80.0–100.0)
MONO ABS: 376 {cells}/uL (ref 200–950)
MPV: 10.2 fL (ref 7.5–12.5)
Monocytes Relative: 8 %
Neutro Abs: 2773 cells/uL (ref 1500–7800)
Neutrophils Relative %: 59 %
Platelets: 219 10*3/uL (ref 140–400)
RBC: 4.13 MIL/uL — AB (ref 4.20–5.80)
RDW: 13.4 % (ref 11.0–15.0)
WBC: 4.7 10*3/uL (ref 4.0–10.5)

## 2016-09-29 LAB — COMPREHENSIVE METABOLIC PANEL
ALK PHOS: 63 U/L (ref 40–115)
ALT: 19 U/L (ref 9–46)
AST: 25 U/L (ref 10–35)
Albumin: 4.3 g/dL (ref 3.6–5.1)
BILIRUBIN TOTAL: 0.7 mg/dL (ref 0.2–1.2)
BUN: 12 mg/dL (ref 7–25)
CALCIUM: 9.5 mg/dL (ref 8.6–10.3)
CO2: 30 mmol/L (ref 20–31)
Chloride: 104 mmol/L (ref 98–110)
Creat: 1.04 mg/dL (ref 0.70–1.25)
GLUCOSE: 92 mg/dL (ref 65–99)
Potassium: 4.3 mmol/L (ref 3.5–5.3)
SODIUM: 142 mmol/L (ref 135–146)
Total Protein: 6.7 g/dL (ref 6.1–8.1)

## 2016-09-29 LAB — LIPID PANEL
Cholesterol: 178 mg/dL (ref 125–200)
HDL: 53 mg/dL (ref >=40)
LDL Cholesterol: 111 mg/dL (ref <130)
Total CHOL/HDL Ratio: 3.4 Ratio (ref <=5.0)
Triglycerides: 70 mg/dL (ref <150)
VLDL: 14 mg/dL (ref <30)

## 2016-09-29 LAB — POCT URINALYSIS DIPSTICK
BILIRUBIN UA: NEGATIVE
Glucose, UA: NEGATIVE
Ketones, UA: NEGATIVE
LEUKOCYTES UA: NEGATIVE
NITRITE UA: NEGATIVE
PH UA: 6.5
PROTEIN UA: NEGATIVE
Spec Grav, UA: 1.025
UROBILINOGEN UA: NEGATIVE

## 2016-09-29 LAB — TSH: TSH: 1.96 mIU/L (ref 0.40–4.50)

## 2016-09-29 MED ORDER — ALPRAZOLAM 0.5 MG PO TABS: ORAL_TABLET | ORAL | 0 refills | Status: DC

## 2016-09-29 MED ORDER — PAROXETINE HCL 40 MG PO TABS: 40.0000 mg | ORAL_TABLET | Freq: Every morning | ORAL | 11 refills | Status: DC

## 2016-09-29 NOTE — Patient Instructions (Signed)
  HEALTH MAINTENANCE RECOMMENDATIONS:  It is recommended that you get at least 30 minutes of aerobic exercise at least 5 days/week (for weight loss, you may need as much as 60-90 minutes). This can be any activity that gets your heart rate up. This can be divided in 10-15 minute intervals if needed, but try and build up your endurance at least once a week.  Weight bearing exercise is also recommended twice weekly.  Eat a healthy diet with lots of vegetables, fruits and fiber.  "Colorful" foods have a lot of vitamins (ie green vegetables, tomatoes, red peppers, etc).  Limit sweet tea, regular sodas and alcoholic beverages, all of which has a lot of calories and sugar.  Up to 2 alcoholic drinks daily may be beneficial for men (unless trying to lose weight, watch sugars).  Drink a lot of water.  Sunscreen of at least SPF 30 should be used on all sun-exposed parts of the skin when outside between the hours of 10 am and 4 pm (not just when at beach or pool, but even with exercise, golf, tennis, and yard work!)  Use a sunscreen that says "broad spectrum" so it covers both UVA and UVB rays, and make sure to reapply every 1-2 hours.  Remember to change the batteries in your smoke detectors when changing your clock times in the spring and fall.  Use your seat belt every time you are in a car, and please drive safely and not be distracted with cell phones and texting while driving.   [name] , Thank you for taking time to come for your Medicare Wellness Visit. I appreciate your ongoing commitment to your health goals. Please review the following plan we discussed and let me know if I can assist you in the future.   These are the goals we discussed: Goals    None      This is a list of the screening recommended for you and due dates:  Health Maintenance  Topic Date Due  .  Hepatitis C: One time screening is recommended by Center for Disease Control  (CDC) for  adults born from [birth year] through 1964.    Sep 02, 1946  . Pneumonia vaccines (1 of 2 - PCV13) 12/21/2012  . Colon Cancer Screening  06/18/2016  . Flu Shot  03/21/2017*  . Shingles Vaccine  09/29/2017*  . Tetanus Vaccine  09/11/2018  *Topic was postponed. The date shown is not the original due date.   I strongly recommended getting yearly flu shots, the Prevnar vaccine (pneumonia vaccine) and shingles vaccine.  You should also get another pneumovax (other type of pneumonia vaccine--you have had one, but are supposed to get another after the age of 60). You are past due for all of these (the dates above show next year, since I postponed them, since you did not want them today).  The colonoscopy date above is 1 year from the last one--if you haven't heard from them (and were told to have a repeat colonoscopy in one year), then please call them to look into and schedule if appropriate.  I highly recommend that you schedule a routine eye exam.  Hepatitis C screen is being done today.

## 2016-09-30 LAB — HEPATITIS C ANTIBODY: HCV AB: NEGATIVE

## 2016-10-03 DIAGNOSIS — Z471 Aftercare following joint replacement surgery: Secondary | ICD-10-CM | POA: Diagnosis not present

## 2016-10-03 DIAGNOSIS — Z96642 Presence of left artificial hip joint: Secondary | ICD-10-CM | POA: Diagnosis not present

## 2016-10-03 DIAGNOSIS — M25552 Pain in left hip: Secondary | ICD-10-CM | POA: Diagnosis not present

## 2016-10-15 ENCOUNTER — Other Ambulatory Visit (HOSPITAL_COMMUNITY): Payer: Self-pay | Admitting: Orthopedic Surgery

## 2016-10-15 DIAGNOSIS — Z96649 Presence of unspecified artificial hip joint: Secondary | ICD-10-CM

## 2016-10-23 ENCOUNTER — Encounter (HOSPITAL_COMMUNITY)
Admission: RE | Admit: 2016-10-23 | Discharge: 2016-10-23 | Disposition: A | Discharge status: Home / Self Care | Payer: PPO | Source: Ambulatory Visit | Attending: Orthopedic Surgery | Admitting: Orthopedic Surgery

## 2016-10-23 DIAGNOSIS — Z96649 Presence of unspecified artificial hip joint: Secondary | ICD-10-CM | POA: Diagnosis not present

## 2016-10-23 DIAGNOSIS — M25552 Pain in left hip: Secondary | ICD-10-CM | POA: Diagnosis not present

## 2016-10-23 MED ORDER — TECHNETIUM TC 99M MEDRONATE IV KIT
25.0000 | PACK | Freq: Once | INTRAVENOUS | Status: AC | PRN
  Administered 2016-10-23: 21 via INTRAVENOUS

## 2016-10-26 ENCOUNTER — Other Ambulatory Visit: Payer: Self-pay | Admitting: Family Medicine

## 2016-11-20 DIAGNOSIS — M25552 Pain in left hip: Secondary | ICD-10-CM | POA: Diagnosis not present

## 2016-11-20 DIAGNOSIS — Z471 Aftercare following joint replacement surgery: Secondary | ICD-10-CM | POA: Diagnosis not present

## 2016-11-20 DIAGNOSIS — Z96642 Presence of left artificial hip joint: Secondary | ICD-10-CM | POA: Diagnosis not present

## 2016-11-20 DIAGNOSIS — G8929 Other chronic pain: Secondary | ICD-10-CM | POA: Diagnosis not present

## 2016-11-26 DIAGNOSIS — M25552 Pain in left hip: Secondary | ICD-10-CM | POA: Diagnosis not present

## 2016-11-27 ENCOUNTER — Encounter: Payer: Self-pay | Admitting: Family Medicine

## 2016-12-12 DIAGNOSIS — Z471 Aftercare following joint replacement surgery: Secondary | ICD-10-CM | POA: Diagnosis not present

## 2016-12-12 DIAGNOSIS — Z96642 Presence of left artificial hip joint: Secondary | ICD-10-CM | POA: Diagnosis not present

## 2017-01-14 ENCOUNTER — Ambulatory Visit (INDEPENDENT_AMBULATORY_CARE_PROVIDER_SITE_OTHER): Payer: PPO | Admitting: Podiatry

## 2017-01-14 ENCOUNTER — Encounter: Payer: Self-pay | Admitting: Podiatry

## 2017-01-14 VITALS — BP 140/84 | HR 54 | Resp 16

## 2017-01-14 DIAGNOSIS — L609 Nail disorder, unspecified: Secondary | ICD-10-CM

## 2017-01-14 DIAGNOSIS — M79676 Pain in unspecified toe(s): Secondary | ICD-10-CM | POA: Diagnosis not present

## 2017-01-14 DIAGNOSIS — B351 Tinea unguium: Secondary | ICD-10-CM

## 2017-01-14 NOTE — Progress Notes (Signed)
   Subjective:    Patient ID: [name]., male    DOB: [birth year], 71 y.o.   MRN: [id number]  Toe Pain     this patient presents to the office with chief complaint of a deformed fungal nail big toe left foot. Patient  says he is also having pain on the outside border of the right big toe for 3 months. His right big toe has split near the cuticle but no redness or swelling or drainage noted. Patient has attempted to self treat, but the pain persists. He presents the office for continued evaluation and treatment of the great toenails both feet  Review of Systems  All other systems reviewed and are negative.      Objective:   Physical Exam GENERAL APPEARANCE: Alert, conversant. Appropriately groomed. No acute distress.  VASCULAR: Pedal pulses are  palpable at  [address] [facility] and PT bilateral.  Capillary refill time is immediate to all digits,  Normal temperature gradient.  Digital hair growth is present bilateral  NEUROLOGIC: sensation is normal to 5.07 monofilament at 5/5 sites bilateral.  Light touch is intact bilateral, Muscle strength normal.  MUSCULOSKELETAL: acceptable muscle strength, tone and stability bilateral.  Intrinsic muscluature intact bilateral.  Rectus appearance of foot and digits noted bilateral.   DERMATOLOGIC: skin color, texture, and turgor are within normal limits.  No preulcerative lesions or ulcers  are seen, no interdigital maceration noted.  No open lesions present.   No drainage noted.  Asymptomatic thick hallux toenail left foot. No redness or drainage noted. There is a transverse break in nail plate. Transverse split right great toenail in absence of drainage or infection.         Assessment & Plan:  Injured deformed hallux toenail B/L.  IE  Debride thick disfigured discolored nail lhallux toenails  B/L   RTC prn   Gardiner Barefoot DPM

## 2017-01-16 ENCOUNTER — Other Ambulatory Visit: Payer: Self-pay | Admitting: Gastroenterology

## 2017-02-24 ENCOUNTER — Ambulatory Visit (HOSPITAL_COMMUNITY): Payer: PPO | Admitting: Certified Registered"

## 2017-02-24 ENCOUNTER — Encounter (HOSPITAL_COMMUNITY): Payer: Self-pay | Admitting: *Deleted

## 2017-02-24 ENCOUNTER — Encounter (HOSPITAL_COMMUNITY)
Admission: RE | Disposition: A | Discharge status: Home / Self Care | Payer: Self-pay | Source: Ambulatory Visit | Attending: Gastroenterology

## 2017-02-24 ENCOUNTER — Ambulatory Visit (HOSPITAL_COMMUNITY)
Admission: RE | Admit: 2017-02-24 | Discharge: 2017-02-24 | Disposition: A | Discharge status: Home / Self Care | Payer: PPO | Source: Ambulatory Visit | Attending: Gastroenterology | Admitting: Gastroenterology

## 2017-02-24 DIAGNOSIS — E785 Hyperlipidemia, unspecified: Secondary | ICD-10-CM | POA: Diagnosis not present

## 2017-02-24 DIAGNOSIS — F419 Anxiety disorder, unspecified: Secondary | ICD-10-CM | POA: Diagnosis not present

## 2017-02-24 DIAGNOSIS — D125 Benign neoplasm of sigmoid colon: Secondary | ICD-10-CM | POA: Insufficient documentation

## 2017-02-24 DIAGNOSIS — E7800 Pure hypercholesterolemia, unspecified: Secondary | ICD-10-CM | POA: Insufficient documentation

## 2017-02-24 DIAGNOSIS — Z86010 Personal history of colonic polyps: Secondary | ICD-10-CM | POA: Diagnosis not present

## 2017-02-24 DIAGNOSIS — Z1211 Encounter for screening for malignant neoplasm of colon: Secondary | ICD-10-CM | POA: Insufficient documentation

## 2017-02-24 DIAGNOSIS — K5790 Diverticulosis of intestine, part unspecified, without perforation or abscess without bleeding: Secondary | ICD-10-CM | POA: Diagnosis not present

## 2017-02-24 DIAGNOSIS — I9589 Other hypotension: Secondary | ICD-10-CM | POA: Diagnosis not present

## 2017-02-24 DIAGNOSIS — Z87442 Personal history of urinary calculi: Secondary | ICD-10-CM | POA: Insufficient documentation

## 2017-02-24 DIAGNOSIS — M5136 Other intervertebral disc degeneration, lumbar region: Secondary | ICD-10-CM | POA: Diagnosis not present

## 2017-02-24 DIAGNOSIS — J45909 Unspecified asthma, uncomplicated: Secondary | ICD-10-CM | POA: Diagnosis not present

## 2017-02-24 DIAGNOSIS — M1990 Unspecified osteoarthritis, unspecified site: Secondary | ICD-10-CM | POA: Insufficient documentation

## 2017-02-24 DIAGNOSIS — K5730 Diverticulosis of large intestine without perforation or abscess without bleeding: Secondary | ICD-10-CM | POA: Diagnosis not present

## 2017-02-24 HISTORY — PX: COLONOSCOPY WITH PROPOFOL: SHX5780

## 2017-02-24 SURGERY — COLONOSCOPY WITH PROPOFOL
Anesthesia: Monitor Anesthesia Care

## 2017-02-24 MED ORDER — EPHEDRINE 5 MG/ML INJ
INTRAVENOUS | Status: AC
  Filled 2017-02-24: qty 10

## 2017-02-24 MED ORDER — EPHEDRINE SULFATE-NACL 50-0.9 MG/10ML-% IV SOSY
PREFILLED_SYRINGE | INTRAVENOUS | Status: DC | PRN
  Administered 2017-02-24: 10 mg via INTRAVENOUS
  Administered 2017-02-24 (×2): 5 mg via INTRAVENOUS

## 2017-02-24 MED ORDER — LACTATED RINGERS IV SOLN
INTRAVENOUS | Status: DC
  Administered 2017-02-24: 10:00:00 via INTRAVENOUS
  Administered 2017-02-24: 1000 mL via INTRAVENOUS

## 2017-02-24 MED ORDER — PHENYLEPHRINE 40 MCG/ML (10ML) SYRINGE FOR IV PUSH (FOR BLOOD PRESSURE SUPPORT)
PREFILLED_SYRINGE | INTRAVENOUS | Status: AC
  Filled 2017-02-24: qty 20

## 2017-02-24 MED ORDER — LIDOCAINE 2% (20 MG/ML) 5 ML SYRINGE
INTRAMUSCULAR | Status: DC | PRN
  Administered 2017-02-24: 40 mg via INTRAVENOUS

## 2017-02-24 MED ORDER — ONDANSETRON HCL 4 MG/2ML IJ SOLN
INTRAMUSCULAR | Status: DC | PRN
  Administered 2017-02-24 (×2): 4 mg via INTRAVENOUS

## 2017-02-24 MED ORDER — PROPOFOL 10 MG/ML IV BOLUS
INTRAVENOUS | Status: AC
  Filled 2017-02-24: qty 40

## 2017-02-24 MED ORDER — SODIUM CHLORIDE 0.9 % IV SOLN: INTRAVENOUS | Status: DC

## 2017-02-24 MED ORDER — PROPOFOL 10 MG/ML IV BOLUS
INTRAVENOUS | Status: DC | PRN
  Administered 2017-02-24 (×6): 40 mg via INTRAVENOUS
  Administered 2017-02-24: 20 mg via INTRAVENOUS
  Administered 2017-02-24 (×4): 40 mg via INTRAVENOUS

## 2017-02-24 MED ORDER — PHENYLEPHRINE 40 MCG/ML (10ML) SYRINGE FOR IV PUSH (FOR BLOOD PRESSURE SUPPORT)
PREFILLED_SYRINGE | INTRAVENOUS | Status: DC | PRN
  Administered 2017-02-24 (×2): 80 ug via INTRAVENOUS
  Administered 2017-02-24: 120 ug via INTRAVENOUS
  Administered 2017-02-24: 80 ug via INTRAVENOUS

## 2017-02-24 MED ORDER — PROPOFOL 10 MG/ML IV BOLUS
INTRAVENOUS | Status: AC
  Filled 2017-02-24: qty 20

## 2017-02-24 SURGICAL SUPPLY — 21 items

## 2017-02-24 NOTE — Op Note (Signed)
[facility] Patient Name: [name] Procedure Date: 02/24/2017 MRN: [id number] Attending MD: Garlan Fair , MD Date of Birth: March 24, 1946 CSN: [id number] Age: 71 Admit Type: Outpatient Procedure:                Colonoscopy Indications:              High risk colon cancer surveillance: Personal                            history of non-advanced adenoma Providers:                Garlan Fair, MD, Hilma Favors, RN, Alfonso Patten, Technician, Glenis Smoker, CRNA Referring MD:              Medicines:                Propofol per Anesthesia Complications:            No immediate complications. Estimated Blood Loss:     Estimated blood loss was minimal. Procedure:                Pre-Anesthesia Assessment:                           - Prior to the procedure, a History and Physical                            was performed, and patient medications and                            allergies were reviewed. The patient's tolerance of                            previous anesthesia was also reviewed. The risks                            and benefits of the procedure and the sedation                            options and risks were discussed with the patient.                            All questions were answered, and informed consent                            was obtained. Prior Anticoagulants: The patient has                            taken no previous anticoagulant or antiplatelet                            agents. ASA Grade Assessment: II - A patient with  mild systemic disease. After reviewing the risks                            and benefits, the patient was deemed in                            satisfactory condition to undergo the procedure.                           After obtaining informed consent, the colonoscope                            was passed under direct vision. Throughout the    procedure, the patient's blood pressure, pulse, and                            oxygen saturations were monitored continuously. The                            EC-[id number]) scope was introduced through                            the anus and advanced to the the cecum, identified                            by appendiceal orifice and ileocecal valve. The                            colonoscopy was performed without difficulty. The                            patient tolerated the procedure well. The quality                            of the bowel preparation was good. The ileocecal                            valve, the appendiceal orifice and the rectum were                            photographed. Scope In: 10:07:59 AM Scope Out: 10:33:25 AM Scope Withdrawal Time: 0 hours 17 minutes 27 seconds  Total Procedure Duration: 0 hours 25 minutes 26 seconds  Findings:      The perianal and digital rectal examinations were normal.      A 5 mm polyp was found in the distal sigmoid colon. The polyp was       sessile. The polyp was removed with a cold snare. Resection and       retrieval were complete.      The exam was otherwise without abnormality. Left colonic diverticulosis       was present. Impression:               - One 5 mm polyp in the distal sigmoid colon,  removed with a cold snare. Resected and retrieved.                           - The examination was otherwise normal. Moderate Sedation:      N/A- Per Anesthesia Care Recommendation:           - Patient has a contact number available for                            emergencies. The signs and symptoms of potential                            delayed complications were discussed with the                            patient. Return to normal activities tomorrow.                            Written discharge instructions were provided to the                            patient.                           - Repeat  colonoscopy in 5 years for surveillance.                           - Resume previous diet.                           - Continue present medications. Procedure Code(s):        --- Professional ---                           [phone], Colonoscopy, flexible; with removal of                            tumor(s), polyp(s), or other lesion(s) by snare                            technique Diagnosis Code(s):        --- Professional ---                           Z86.010, Personal history of colonic polyps                           D12.5, Benign neoplasm of sigmoid colon CPT copyright 2016 [employer]. All rights reserved. The codes documented in this report are preliminary and upon coder review may  be revised to meet current compliance requirements. Earle Gell, MD Garlan Fair, MD 02/24/2017 10:43:44 AM This report has been signed electronically. Number of Addenda: 0

## 2017-02-24 NOTE — Transfer of Care (Signed)
Immediate Anesthesia Transfer of Care Note  Patient: [name].  Procedure(s) Performed: Procedure(s): COLONOSCOPY WITH PROPOFOL (N/A)  Patient Location: PACU and Endoscopy Unit  Anesthesia Type:MAC  Level of Consciousness: sedated  Airway & Oxygen Therapy: Patient Spontanous Breathing and Patient connected to face mask oxygen  Post-op Assessment: Report given to RN and Post -op Vital signs reviewed and stable  Post vital signs: Reviewed and stable  Last Vitals:  Vitals:   02/24/17 0919  BP: 124/71  Pulse: 63  Resp: 13  Temp: 36.7 C    Last Pain:  Vitals:   02/24/17 0919  TempSrc: Oral         Complications: No apparent anesthesia complications

## 2017-02-24 NOTE — Discharge Instructions (Signed)

## 2017-02-24 NOTE — H&P (Signed)
Procedure: Surveillance colonoscopy. History of adenomatous colon polyps removed colonoscopically in the past.  History: The patient is a 71 year old male born March 11, 1946. He is scheduled to undergo a surveillance colonoscopy today.  Past medical history: Hypercholesterolemia. Asthma. Kidney stones. Cervical spine surgery. Knee surgery. Sinus surgery. Elbow surgery. Left rotator cuff surgery. Cholecystectomy.  Exam: The patient is alert and lying comfortably on the endoscopy stretcher. Abdomen is soft and nontender to palpation. Lungs are clear to auscultation. Cardiac exam reveals a regular rhythm.  Plan: Proceed with surveillance colonoscopy

## 2017-02-24 NOTE — Anesthesia Preprocedure Evaluation (Addendum)
Anesthesia Evaluation  Patient identified by MRN, date of birth, ID band Patient awake    Reviewed: Allergy & Precautions, NPO status , Patient's Chart, lab work & pertinent test results  History of Anesthesia Complications (+) PONV and history of anesthetic complications  Airway Mallampati: II  TM Distance: >3 FB Neck ROM: Full    Dental  (+) Caps, Dental Advisory Given,    Pulmonary asthma ,    Pulmonary exam normal breath sounds clear to auscultation       Cardiovascular negative cardio ROS Normal cardiovascular exam Rhythm:Regular Rate:Normal     Neuro/Psych PSYCHIATRIC DISORDERS Anxiety Hx/o panic attacksnegative neurological ROS     GI/Hepatic (+)     substance abuse  alcohol use, Hx/o colon polyps Hx/o diverticulosis   Endo/Other  Hypercholesterolemia  Renal/GU Renal diseaseHx/o renal calculi  negative genitourinary   Musculoskeletal  (+) Arthritis , Osteoarthritis,  Chronic LBP DDD lumbar spine   Abdominal Normal abdominal exam  (+)   Peds  Hematology negative hematology ROS (+)   Anesthesia Other Findings   Reproductive/Obstetrics                            Anesthesia Physical Anesthesia Plan  ASA: II  Anesthesia Plan: MAC   Post-op Pain Management:    Induction:   Airway Management Planned: Natural Airway and Nasal Cannula  Additional Equipment:   Intra-op Plan:   Post-operative Plan:   Informed Consent: I have reviewed the patients History and Physical, chart, labs and discussed the procedure including the risks, benefits and alternatives for the proposed anesthesia with the patient or authorized representative who has indicated his/her understanding and acceptance.   Dental advisory given  Plan Discussed with: Anesthesiologist, CRNA and Surgeon  Anesthesia Plan Comments:         Anesthesia Quick Evaluation

## 2017-02-24 NOTE — Anesthesia Postprocedure Evaluation (Signed)
Anesthesia Post Note  Patient: Pasqualino Witherspoon.  Procedure(s) Performed: Procedure(s) (LRB): COLONOSCOPY WITH PROPOFOL (N/A)  Patient location during evaluation: PACU Anesthesia Type: MAC Level of consciousness: awake and alert Pain management: pain level controlled Vital Signs Assessment: post-procedure vital signs reviewed and stable Respiratory status: spontaneous breathing, nonlabored ventilation and respiratory function stable Cardiovascular status: stable and blood pressure returned to baseline Postop Assessment: no signs of nausea or vomiting Anesthetic complications: no       Last Vitals:  Vitals:   02/24/17 0919 02/24/17 1040  BP: 124/71 (!) 118/58  Pulse: 63 62  Resp: 13 17  Temp: 36.7 C 36.5 C    Last Pain:  Vitals:   02/24/17 1040  TempSrc: Oral                 Haivyn Oravec A.

## 2017-02-25 ENCOUNTER — Encounter (HOSPITAL_COMMUNITY): Payer: Self-pay | Admitting: Gastroenterology

## 2017-03-26 DIAGNOSIS — J330 Polyp of nasal cavity: Secondary | ICD-10-CM | POA: Diagnosis not present

## 2017-04-15 ENCOUNTER — Ambulatory Visit: Payer: PPO | Admitting: Podiatry

## 2017-06-01 ENCOUNTER — Other Ambulatory Visit: Payer: Self-pay | Admitting: Family Medicine

## 2017-06-01 NOTE — Telephone Encounter (Signed)
Is this okay to refill? 

## 2017-06-03 ENCOUNTER — Encounter: Payer: Self-pay | Admitting: Podiatry

## 2017-06-03 ENCOUNTER — Ambulatory Visit (INDEPENDENT_AMBULATORY_CARE_PROVIDER_SITE_OTHER): Payer: PPO | Admitting: Podiatry

## 2017-06-03 DIAGNOSIS — L03031 Cellulitis of right toe: Secondary | ICD-10-CM

## 2017-06-03 DIAGNOSIS — L600 Ingrowing nail: Secondary | ICD-10-CM | POA: Diagnosis not present

## 2017-06-03 NOTE — Progress Notes (Signed)
This patient presents the office with chief complaint of a red and swollen nail on the inside border the big toe, right foot. He says it is painful walking and wearing his shoes. He has provided no self treatment or sought any professional help.  He presents the office today for definitive evaluation and treatment of this painful ingrowing toenail  GENERAL APPEARANCE: Alert, conversant. Appropriately groomed. No acute distress.  VASCULAR: Pedal pulses are  palpable at  [facility] and PT bilateral.  Capillary refill time is immediate to all digits,  Normal temperature gradient.  Digital hair growth is present bilateral  NEUROLOGIC: sensation is normal to 5.07 monofilament at 5/5 sites bilateral.  Light touch is intact bilateral, Muscle strength normal.  MUSCULOSKELETAL: acceptable muscle strength, tone and stability bilateral.  Intrinsic muscluature intact bilateral.  Rectus appearance of foot and digits noted bilateral.  NAILS  marked incurvation noted along the medial border of the right great toe. The medial aspect of the nail appears to be separated from the nailbed. No evidence of any drainage or pus noted DERMATOLOGIC: skin color, texture, and turgor are within normal limits.  No preulcerative lesions or ulcers  are seen, no interdigital maceration noted.  No open lesions present.   No drainage noted.  Paronychia medial border right great toe.  Ingrown toenail right hallux.  ROV  Nail surgery.  Treatment options and alternatives discussed.  Recommended an incision and drainage and patient agreed.  Right hallux  was prepped with alcohol and a 3cc. of  2% lidocaine plain was administered in a digital block fashion.  The toe was then prepped with betadine solution .  The offending nail border was then excised and all necrotic tissue was resected.  The area was then cleansed  and antibiotic ointment and a dry sterile dressing was applied.  The patient was dispensed instructions for aftercare. Patient was  instructed to return to the office in one week for further evaluation and treatment   Gardiner Barefoot DPM

## 2017-06-10 ENCOUNTER — Ambulatory Visit (INDEPENDENT_AMBULATORY_CARE_PROVIDER_SITE_OTHER): Payer: Self-pay | Admitting: Podiatry

## 2017-06-10 ENCOUNTER — Encounter: Payer: Self-pay | Admitting: Podiatry

## 2017-06-10 DIAGNOSIS — Z09 Encounter for follow-up examination after completed treatment for conditions other than malignant neoplasm: Secondary | ICD-10-CM

## 2017-06-10 NOTE — Progress Notes (Signed)
This patient returns to the office following nail surgery one week ago.  The patient says toe has been soaked and bandaged as directed.  There has been improvement of the toe since the surgery has been performed. The patient presents for continued evaluation and treatment.  GENERAL APPEARANCE: Alert, conversant. Appropriately groomed. No acute distress.  VASCULAR: Pedal pulses palpable at  [facility] and PT bilateral.  Capillary refill time is immediate to all digits,  Normal temperature gradient.    NEUROLOGIC: sensation is normal to 5.07 monofilament at 5/5 sites bilateral.  Light touch is intact bilateral, Muscle strength normal.  MUSCULOSKELETAL: acceptable muscle strength, tone and stability bilateral.  Intrinsic muscluature intact bilateral.  Rectus appearance of foot and digits noted bilateral.   DERMATOLOGIC: skin color, texture, and turgor are within normal limits.  No preulcerative lesions or ulcers  are seen, no interdigital maceration noted.   NAILS  There is necrotic tissue along the nail groove  In the absence of redness swelling and pain.  DX  S/p nail surgery  ROV  Home instructions were discussed.  Patient to call the office if there are any questions or concerns.   Sundra Haddix DPM   

## 2017-06-11 DIAGNOSIS — D225 Melanocytic nevi of trunk: Secondary | ICD-10-CM | POA: Diagnosis not present

## 2017-06-11 DIAGNOSIS — L814 Other melanin hyperpigmentation: Secondary | ICD-10-CM | POA: Diagnosis not present

## 2017-09-14 ENCOUNTER — Telehealth: Payer: Self-pay | Admitting: Family Medicine

## 2017-09-14 NOTE — Telephone Encounter (Signed)
Pt called and wants to discuss him getting a letter from you for his Service Animal Dog for his anxiety.  He is moving to [facility] into an apartment and he needs a letter so they will let him have his dog.  He has a CPE on 10/17 and he will be discussing this with you.  Pt ph [phone]

## 2017-09-21 ENCOUNTER — Ambulatory Visit (INDEPENDENT_AMBULATORY_CARE_PROVIDER_SITE_OTHER): Payer: PPO | Admitting: Family Medicine

## 2017-09-21 ENCOUNTER — Encounter: Payer: Self-pay | Admitting: Family Medicine

## 2017-09-21 VITALS — BP 130/80 | HR 60 | Ht 74.25 in | Wt 190.2 lb

## 2017-09-21 DIAGNOSIS — F410 Panic disorder [episodic paroxysmal anxiety] without agoraphobia: Secondary | ICD-10-CM | POA: Diagnosis not present

## 2017-09-21 NOTE — Progress Notes (Signed)
Chief Complaint  Patient presents with  . Advice Only    needs letter for apartment, service animal. Fax to [facility] in [place] [phone].  Marland Kitchen Flu Vaccine    declined.    Patient is moving to [place].  He put his house up for sale--it sold in 6 days.  Closes 11/9; moving out 10/26. Waiting for settlement (from case involving hip)  before he can buy house in [place].  He is moving into an apartment there until then.  He is looking forward to the move, as his daughter, son, 3 granddaughters, 1/2 brother and adopted sister all live there.  His daughter told him to get a letter stating that the dog helps with his anxiety, as this would get rid of him having to pay the dog deposit, and extra cost in his rent.  That is why he presents today, for this letter to be faxed.  He denies any other complaints.  He is scheduled for a complete physical in a few weeks. He states that his dog helps him a lot with his anxiety.  It flared a lot after losing his last dog, and getting this current one helped him a lot.  His old chart was reviewed.  While there is mention of him having a dog (and losing the one, and getting another), it is well documented in his chart that his anxiety and panic disorder have been well controlled with his medication, Paxil (without mention of his dog as part of his therapy).  PMH, PSH, SH reviewed  Outpatient Encounter Prescriptions as of 09/21/2017  Medication Sig  . ALPRAZolam (XANAX) 0.5 MG tablet TAKE 1/2 TO 1 TABLET BY MOUTH THREE TIMES DAILY AS NEEDED FOR ANXIETY OR SLEEP. (Patient taking differently: Take 0.25-0.5 mg by mouth at bedtime as needed for anxiety or sleep. )  . calcium carbonate (OSCAL) 1500 (600 Ca) MG TABS tablet Take 1 tablet by mouth daily.  Marland Kitchen FLOVENT HFA 110 MCG/ACT inhaler INHALE 1 PUFF INTO THE LUNGS TWICE DAILY  . fluticasone (FLONASE) 50 MCG/ACT nasal spray SHAKE LIQUID AND USE 2 SPRAYS IN EACH NOSTRIL EVERY DAY  . Multiple Vitamin  (MULTIVITAMIN WITH MINERALS) TABS Take 1 tablet by mouth every morning.   Marland Kitchen PARoxetine (PAXIL) 40 MG tablet Take 1 tablet (40 mg total) by mouth every morning.  Marland Kitchen albuterol (PROVENTIL HFA;VENTOLIN HFA) 108 (90 BASE) MCG/ACT inhaler Inhale 2 puffs into the lungs every 6 (six) hours as needed. (Patient not taking: Reported on 09/21/2017)  . diphenhydramine-acetaminophen (TYLENOL PM) 25-500 MG TABS tablet Take 1 tablet by mouth at bedtime as needed (sleep/pain).  Marland Kitchen levocetirizine (XYZAL) 5 MG tablet TAKE 1 TABLET (5 MG TOTAL) BY MOUTH DAILY WITH BREAKFAST. (Patient not taking: Reported on 09/21/2017)   No facility-administered encounter medications on file as of 09/21/2017.    No Known Allergies  ROS: anxiety is well controlled, moods are good. Denies any complaints (URI symptoms, chest pain, headaches, rashes, etc).    PHYSICAL EXAM:   BP 130/80 (BP Location: Left Arm, Patient Position: Sitting, Cuff Size: Normal)   Pulse 60   Ht 6' 2.25" (1.886 m)   Wt 190 lb 3.2 oz (86.3 kg)   BMI 24.26 kg/m   Pleasant, well appearing male, in good spirits, in no distress Normal mood, affect, hygiene and grooming; normal eye contact, speech He is alert, oriented. He has grossly normal cranial nerves, and normal gait Skin appears normal, normal turgor, no rashes/lesions  ASSESSMENT/PLAN:  PANIC DISORDER - well  controlled with paxil; I do not doubt that his dog helps with anxiety, but I will not state it is a service animal.     He understands that I will be faxing the following note, NOT stating this is a service animal (and that I will not write that). If this doesn't work, he understands that he will need to pay the additional fees, as requested at the apartment complex (where dogs are allowed; just doesn't want to pay the extra rent/deposit).    Letter faxed stating: Mr. Dershem has been under my care for anxiety since 2012.  He reports that his dog, in addition to his medication, is quite  helpful in decreasing his anxiety.   F/u as scheduled for CPE in a few weeks, prior to moving.
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
## 2017-10-07 ENCOUNTER — Encounter: Payer: Self-pay | Admitting: Family Medicine

## 2017-10-07 ENCOUNTER — Ambulatory Visit (INDEPENDENT_AMBULATORY_CARE_PROVIDER_SITE_OTHER): Payer: PPO | Admitting: Family Medicine

## 2017-10-07 VITALS — BP 118/76 | HR 72 | Ht 74.5 in | Wt 187.0 lb

## 2017-10-07 DIAGNOSIS — Z125 Encounter for screening for malignant neoplasm of prostate: Secondary | ICD-10-CM | POA: Diagnosis not present

## 2017-10-07 DIAGNOSIS — F410 Panic disorder [episodic paroxysmal anxiety] without agoraphobia: Secondary | ICD-10-CM | POA: Diagnosis not present

## 2017-10-07 DIAGNOSIS — Z0000 Encounter for general adult medical examination without abnormal findings: Secondary | ICD-10-CM

## 2017-10-07 DIAGNOSIS — E7800 Pure hypercholesterolemia, unspecified: Secondary | ICD-10-CM

## 2017-10-07 DIAGNOSIS — J45909 Unspecified asthma, uncomplicated: Secondary | ICD-10-CM | POA: Diagnosis not present

## 2017-10-07 DIAGNOSIS — Z5181 Encounter for therapeutic drug level monitoring: Secondary | ICD-10-CM

## 2017-10-07 LAB — COMPREHENSIVE METABOLIC PANEL
AG RATIO: 2 (calc) (ref 1.0–2.5)
ALT: 25 U/L (ref 9–46)
AST: 29 U/L (ref 10–35)
Albumin: 4.3 g/dL (ref 3.6–5.1)
Alkaline phosphatase (APISO): 62 U/L (ref 40–115)
BUN: 14 mg/dL (ref 7–25)
CALCIUM: 9.4 mg/dL (ref 8.6–10.3)
CO2: 30 mmol/L (ref 20–32)
Chloride: 107 mmol/L (ref 98–110)
Creat: 1 mg/dL (ref 0.70–1.25)
GLUCOSE: 97 mg/dL (ref 65–99)
Globulin: 2.1 g/dL (calc) (ref 1.9–3.7)
Potassium: 4.5 mmol/L (ref 3.5–5.3)
SODIUM: 142 mmol/L (ref 135–146)
TOTAL PROTEIN: 6.4 g/dL (ref 6.1–8.1)
Total Bilirubin: 0.6 mg/dL (ref 0.2–1.2)

## 2017-10-07 LAB — LIPID PANEL
CHOLESTEROL: 171 mg/dL (ref <200)
HDL: 49 mg/dL (ref >40)
LDL Cholesterol (Calc): 107 mg/dL (calc) — ABNORMAL HIGH
Non-HDL Cholesterol (Calc): 122 mg/dL (calc) (ref <130)
Total CHOL/HDL Ratio: 3.5 (calc) (ref <5.0)
Triglycerides: 66 mg/dL (ref <150)

## 2017-10-07 LAB — POCT URINALYSIS DIP (PROADVANTAGE DEVICE)
Bilirubin, UA: NEGATIVE
GLUCOSE UA: NEGATIVE mg/dL
Leukocytes, UA: NEGATIVE
NITRITE UA: NEGATIVE
Nitrite, UA: NEGATIVE
PH UA: 7 (ref 5.0–8.0)
Protein Ur, POC: NEGATIVE mg/dL
RBC UA: NEGATIVE
SPECIFIC GRAVITY, URINE: 1.02
UUROB: NEGATIVE

## 2017-10-07 LAB — CBC WITH DIFFERENTIAL/PLATELET
BASOS PCT: 0.6 %
Basophils Absolute: 32 cells/uL (ref 0–200)
EOS ABS: 140 {cells}/uL (ref 15–500)
Eosinophils Relative: 2.6 %
HEMATOCRIT: 40.3 % (ref 38.5–50.0)
HEMOGLOBIN: 13.8 g/dL (ref 13.2–17.1)
LYMPHS ABS: 1264 {cells}/uL (ref 850–3900)
MCH: 33 pg (ref 27.0–33.0)
MCHC: 34.2 g/dL (ref 32.0–36.0)
MCV: 96.4 fL (ref 80.0–100.0)
MPV: 10.6 fL (ref 7.5–12.5)
Monocytes Relative: 6.2 %
NEUTROS ABS: 3629 {cells}/uL (ref 1500–7800)
Neutrophils Relative %: 67.2 %
Platelets: 221 10*3/uL (ref 140–400)
RBC: 4.18 10*6/uL — ABNORMAL LOW (ref 4.20–5.80)
RDW: 12.1 % (ref 11.0–15.0)
Total Lymphocyte: 23.4 %
WBC: 5.4 10*3/uL (ref 3.8–10.8)
WBCMIX: 335 {cells}/uL (ref 200–950)

## 2017-10-07 LAB — PSA: PSA: 1.1 ng/mL (ref <=4.0)

## 2017-10-07 MED ORDER — PAROXETINE HCL 40 MG PO TABS: 40.0000 mg | ORAL_TABLET | Freq: Every morning | ORAL | 11 refills | Status: AC

## 2017-10-07 MED ORDER — FLUTICASONE PROPIONATE HFA 110 MCG/ACT IN AERO: INHALATION_SPRAY | RESPIRATORY_TRACT | 5 refills | Status: AC

## 2017-10-07 MED ORDER — ALPRAZOLAM 0.5 MG PO TABS: ORAL_TABLET | ORAL | 0 refills | Status: AC

## 2017-10-07 NOTE — Patient Instructions (Signed)
  HEALTH MAINTENANCE RECOMMENDATIONS:  It is recommended that you get at least 30 minutes of aerobic exercise at least 5 days/week (for weight loss, you may need as much as 60-90 minutes). This can be any activity that gets your heart rate up. This can be divided in 10-15 minute intervals if needed, but try and build up your endurance at least once a week.  Weight bearing exercise is also recommended twice weekly.  Eat a healthy diet with lots of vegetables, fruits and fiber.  "Colorful" foods have a lot of vitamins (ie green vegetables, tomatoes, red peppers, etc).  Limit sweet tea, regular sodas and alcoholic beverages, all of which has a lot of calories and sugar.  Up to 2 alcoholic drinks daily may be beneficial for men (unless trying to lose weight, watch sugars).  Drink a lot of water.  Sunscreen of at least SPF 30 should be used on all sun-exposed parts of the skin when outside between the hours of 10 am and 4 pm (not just when at beach or pool, but even with exercise, golf, tennis, and yard work!)  Use a sunscreen that says "broad spectrum" so it covers both UVA and UVB rays, and make sure to reapply every 1-2 hours.  Remember to change the batteries in your smoke detectors when changing your clock times in the spring and fall.  Use your seat belt every time you are in a car, and please drive safely and not be distracted with cell phones and texting while driving.   [name] , Thank you for taking time to come for your Medicare Wellness Visit. I appreciate your ongoing commitment to your health goals. Please review the following plan we discussed and let me know if I can assist you in the future.   These are the goals we discussed: Goals    None      This is a list of the screening recommended for you and due dates:  Health Maintenance  Topic Date Due  . Pneumonia vaccines (1 of 2 - PCV13) 12/21/2012  . Flu Shot  07/22/2017  . Colon Cancer Screening  02/24/2018  . Tetanus Vaccine   09/11/2018  .  Hepatitis C: One time screening is recommended by Center for Disease Control  (CDC) for  adults born from [birth year] through 1964.   Completed   We recommend yearly high dose flu shots, and pneumonia vaccines (I recommend Prevnar-13 today and pneumovax in a year).  You declined these Tetanus booster is due next year Ignore the colon cancer date --you just had colonoscopy and it was normal  I recommend getting the new shingles vaccine (Shingrix). You will need to check with your insurance to see if it is covered, and if covered by Medicare Part D, you need to get from the pharmacy rather than our office.  It is a series of 2 injections, spaced 2 months apart.  Please schedule routine eye exam. Continue with regular dental exams (due now).  Please get your living will and healthcare power of attorney notarized.  Once you move, give copies to your PCP.

## 2017-11-13 ENCOUNTER — Other Ambulatory Visit: Payer: Self-pay | Admitting: Family Medicine

## 2017-11-20 DIAGNOSIS — B029 Zoster without complications: Secondary | ICD-10-CM | POA: Diagnosis not present

## 2017-11-23 ENCOUNTER — Telehealth: Payer: Self-pay | Admitting: Family Medicine

## 2017-11-23 NOTE — Telephone Encounter (Signed)
Pt called and has moved to [facility] wants records faxed to [phone]
